# Patient Record
Sex: Male | Born: 1960
Health system: Southern US, Community
[De-identification: ages and names within clinical notes are randomized; demographics above are authoritative.]

## PROBLEM LIST (undated history)

## (undated) DIAGNOSIS — I2510 Atherosclerotic heart disease of native coronary artery without angina pectoris: Secondary | ICD-10-CM

## (undated) DIAGNOSIS — M5136 Other intervertebral disc degeneration, lumbar region: Secondary | ICD-10-CM

## (undated) DIAGNOSIS — D494 Neoplasm of unspecified behavior of bladder: Secondary | ICD-10-CM

## (undated) DIAGNOSIS — F32A Depression, unspecified: Secondary | ICD-10-CM

## (undated) DIAGNOSIS — F329 Major depressive disorder, single episode, unspecified: Secondary | ICD-10-CM

## (undated) DIAGNOSIS — I440 Atrioventricular block, first degree: Secondary | ICD-10-CM

## (undated) DIAGNOSIS — E669 Obesity, unspecified: Secondary | ICD-10-CM

## (undated) DIAGNOSIS — I200 Unstable angina: Secondary | ICD-10-CM

## (undated) DIAGNOSIS — F4310 Post-traumatic stress disorder, unspecified: Secondary | ICD-10-CM

## (undated) DIAGNOSIS — I10 Essential (primary) hypertension: Secondary | ICD-10-CM

## (undated) DIAGNOSIS — C679 Malignant neoplasm of bladder, unspecified: Secondary | ICD-10-CM

## (undated) DIAGNOSIS — M51369 Other intervertebral disc degeneration, lumbar region without mention of lumbar back pain or lower extremity pain: Secondary | ICD-10-CM

## (undated) DIAGNOSIS — R002 Palpitations: Secondary | ICD-10-CM

## (undated) DIAGNOSIS — Z8719 Personal history of other diseases of the digestive system: Secondary | ICD-10-CM

## (undated) DIAGNOSIS — R51 Headache: Secondary | ICD-10-CM

## (undated) DIAGNOSIS — F419 Anxiety disorder, unspecified: Secondary | ICD-10-CM

## (undated) DIAGNOSIS — E785 Hyperlipidemia, unspecified: Secondary | ICD-10-CM

## (undated) DIAGNOSIS — E119 Type 2 diabetes mellitus without complications: Secondary | ICD-10-CM

## (undated) DIAGNOSIS — K760 Fatty (change of) liver, not elsewhere classified: Secondary | ICD-10-CM

## (undated) DIAGNOSIS — Z87442 Personal history of urinary calculi: Secondary | ICD-10-CM

## (undated) DIAGNOSIS — K859 Acute pancreatitis without necrosis or infection, unspecified: Secondary | ICD-10-CM

## (undated) DIAGNOSIS — Q453 Other congenital malformations of pancreas and pancreatic duct: Secondary | ICD-10-CM

## (undated) DIAGNOSIS — Z87898 Personal history of other specified conditions: Secondary | ICD-10-CM

## (undated) HISTORY — DX: Acute pancreatitis without necrosis or infection, unspecified: K85.90

## (undated) HISTORY — PX: TONSILLECTOMY: SUR1361

## (undated) HISTORY — DX: Other intervertebral disc degeneration, lumbar region without mention of lumbar back pain or lower extremity pain: M51.369

## (undated) HISTORY — PX: CARDIAC CATHETERIZATION: SHX172

## (undated) HISTORY — DX: Depression, unspecified: F32.A

## (undated) HISTORY — DX: Major depressive disorder, single episode, unspecified: F32.9

## (undated) HISTORY — DX: Other congenital malformations of pancreas and pancreatic duct: Q45.3

## (undated) HISTORY — DX: Fatty (change of) liver, not elsewhere classified: K76.0

## (undated) HISTORY — DX: Other intervertebral disc degeneration, lumbar region: M51.36

---

## 2001-09-23 ENCOUNTER — Encounter: Payer: Self-pay | Admitting: *Deleted

## 2001-09-23 ENCOUNTER — Emergency Department (HOSPITAL_COMMUNITY): Admission: EM | Admit: 2001-09-23 | Discharge: 2001-09-23 | Payer: Self-pay | Admitting: *Deleted

## 2002-10-08 ENCOUNTER — Ambulatory Visit (HOSPITAL_COMMUNITY): Admission: RE | Admit: 2002-10-08 | Discharge: 2002-10-08 | Payer: Self-pay | Admitting: *Deleted

## 2006-03-08 ENCOUNTER — Ambulatory Visit: Payer: Self-pay | Admitting: Internal Medicine

## 2006-03-11 ENCOUNTER — Encounter: Payer: Self-pay | Admitting: Cardiology

## 2006-03-11 ENCOUNTER — Ambulatory Visit: Payer: Self-pay

## 2006-03-13 ENCOUNTER — Ambulatory Visit (HOSPITAL_COMMUNITY): Admission: RE | Admit: 2006-03-13 | Discharge: 2006-03-13 | Payer: Self-pay | Admitting: Internal Medicine

## 2006-03-15 ENCOUNTER — Inpatient Hospital Stay (HOSPITAL_BASED_OUTPATIENT_CLINIC_OR_DEPARTMENT_OTHER): Admission: RE | Admit: 2006-03-15 | Discharge: 2006-03-15 | Payer: Self-pay | Admitting: *Deleted

## 2006-03-15 ENCOUNTER — Ambulatory Visit: Payer: Self-pay | Admitting: Internal Medicine

## 2006-03-17 ENCOUNTER — Emergency Department (HOSPITAL_COMMUNITY): Admission: EM | Admit: 2006-03-17 | Discharge: 2006-03-17 | Payer: Self-pay | Admitting: Emergency Medicine

## 2006-03-17 ENCOUNTER — Ambulatory Visit: Payer: Self-pay | Admitting: Cardiology

## 2006-04-09 ENCOUNTER — Ambulatory Visit: Payer: Self-pay | Admitting: Internal Medicine

## 2008-06-14 ENCOUNTER — Inpatient Hospital Stay (HOSPITAL_COMMUNITY): Admission: EM | Admit: 2008-06-14 | Discharge: 2008-06-15 | Payer: Self-pay | Admitting: Emergency Medicine

## 2008-06-14 ENCOUNTER — Encounter (INDEPENDENT_AMBULATORY_CARE_PROVIDER_SITE_OTHER): Payer: Self-pay | Admitting: Emergency Medicine

## 2008-06-14 ENCOUNTER — Ambulatory Visit: Payer: Self-pay | Admitting: *Deleted

## 2008-11-26 HISTORY — PX: POSTERIOR LUMBAR FUSION: SHX6036

## 2008-12-28 ENCOUNTER — Encounter: Admission: RE | Admit: 2008-12-28 | Discharge: 2008-12-28 | Payer: Self-pay | Admitting: Orthopaedic Surgery

## 2011-04-10 NOTE — Discharge Summary (Signed)
[name], [name]                ACCOUNT NO.:  [id number]   MEDICAL RECORD NO.:  [id number]          PATIENT TYPE:  INP   LOCATION:  5501                         FACILITY:  [facility]   PHYSICIAN:  Ladell Pier, M.D.   DATE OF BIRTH:  January 19, 1961   DATE OF ADMISSION:  06/13/2008  DATE OF DISCHARGE:                               DISCHARGE SUMMARY   DISCHARGE DIAGNOSES:  1. Headache.  2. Syncopal episode.  3. Coronary artery disease, two previous cardiac catheterizations in      October 08, 2002 and March 15, 2006.  4. Dyslipidemia.  5. Tobacco use.  6. 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  7. History of arrhythmia evaluated in 2007.  8. History of anxiety.  9. Depression.   DISCHARGE MEDICATIONS:  1. Lipitor 40 mg daily.  2. Toprol-XL 25 mg daily, decreased from twice daily secondary to      bradycardia.  3. Aspirin 81 mg daily.  4. Zetia 10 mg daily.  5. Trilipix 135 mg daily.  6. Lexapro 10 mg daily.  7. Xanax 0.25 mg daily.  8. Vicodin q.6 h. as needed.   FOLLOWUP APPOINTMENTS:  The patient to follow up with Dr. Kathi Der  office.  He sees [employer], the [place].  The patient to follow up in 1  week.   PROCEDURES:  MRI/MRA shows no acute infarct, no intracranial hemorrhage  noted, no mass lesions, polypoid opacification in the maxillary sinus.  MR angiogram showed no aneurysm.  Chest x-ray:  No active lung disease.  CT of the head:  No acute intracranial abnormality.  Carotid Dopplers:  No blockage.   CONSULTATIONS:  None.   HISTORY OF PRESENT ILLNESS:  The patient is a 50 year old white male  presented to the emergency room with severe headache.  [place] he [place] out of his car.  He was  only out for a few minutes.  His wife was there when the episode  happened and he was brought to the emergency department by ambulance for  further evaluation.  Please see admission note for remainder of history.  Past medical  history, family history, social history, meds, allergies,  review of systems per admission H&P.   PHYSICAL EXAMINATION ON DISCHARGE:  VITAL SIGNS:  Temperature 98.1,  pulse of 64, respirations 15, blood pressure 123/75, pulse ox 97% on  room air.  HEENT:  Head is normocephalic, atraumatic.  Pupils reactive to light  without erythema.  CARDIOVASCULAR:  Slightly bradycardic.  LUNGS:  Clear bilaterally.  No wheezes, rhonchi, or rales.  ABDOMEN:  Positive bowel sounds.  EXTREMITIES:  Without edema.   HOSPITAL COURSE:  1. Syncopal episode/headache:  The patient was admitted to the      hospital for further treatment.  He had MRI/MRA and head CT done      that were fine.  Carotid Doppler was normal.  Sed rate was normal.      His headache resolved.  He was discharged home to follow up with      his primary care  physician.  If the headache returned, he may need      to see a neurologist but now the headache is totally gone.  He was      noted to have decreased pulse of 55, was low prior to getting his      beta blocker.  The beta blocker was held and his pulse came up to      64.  His Toprol was decreased to once a day secondary to      bradycardia.  2. Dyslipidemia:  He was continued on his cholesterol medications.   DISCHARGE LABORATORY DATA:  Sed rate of 2, D-dimer of 0.31, TSH 1.284,  and BNP 81.  Sodium 140, potassium 3.5, chloride 109, CO2 24, glucose  114, BUN 9, and creatinine 0.80.  Troponin less than 0.01.  WBC 8.8,  hemoglobin 14.6, and platelet 221.      Ladell Pier, M.D.  Electronically Signed     NJ/MEDQ  D:  06/15/2008  T:  06/16/2008  Job:  [id number]   cc:   Lindaann Pascal, PA-C  Ernestina Penna, M.D.

## 2011-04-10 NOTE — H&P (Signed)
NAMESYLVIO, [name]                ACCOUNT NO.:  [id number]   MEDICAL RECORD NO.:  [id number]          PATIENT TYPE:  INP   LOCATION:  5501                         FACILITY:  [facility]   PHYSICIAN:  Della Goo, M.D. DATE OF BIRTH:  September 12, 1961   DATE OF ADMISSION:  06/13/2008  DATE OF DISCHARGE:                              HISTORY & PHYSICAL   PRIMARY CARE PHYSICIAN:  Dr. Forestine Chute.   CHIEF COMPLAINT:  Headache and passed out.   HISTORY OF PRESENT ILLNESS:  A 50 year old male presenting to the  emergency department with having a severe headache all day.  He reports  [place] out [place] the [place] when he was getting out of his car.  He was  only passed out for less than a minute.  His wife was there when the  episode happened, and he was brought to the emergency department by  ambulance for further evaluation.  The patient reports having dizziness  which occurred prior to passing out.  He reports also having blurry  vision.  He denies having any chest pain or shortness of breath during  the event.  He denies having any nausea and vomiting.  He reports that  his dizziness did resolve.  The patient reports still having a headache  which is in the top of his head and in the back of his head, and it is a  dull headache.  The patient denies having any neck stiffness or fevers,  chills.   PAST MEDICAL HISTORY:  Significant for:  1. Coronary artery disease which is not obstructive, 2 previous      cardiac catheterizations October 08, 2002 and March 15, 2006.  2. Also history of hyperlipidemia.  3. Tobacco abuse.  4. The patient also has a history of a reported bulging disk and      degenerative disk disease of the lumbar spine.  5. The patient also has a history of arrhythmia which was evaluated in      2007.  6. Also history of anxiety/depression.   MEDICATIONS:  Include:  1. Lipitor 40 mg 1 p.o. daily.  2. Toprol XL 25 mg 1 p.o. b.i.d.  3. Aspirin 81 mg 1 p.o. daily.  4. Zetia 10  mg 1 p.o. daily.  5. Trilipix 135 mg 1 p.o. daily.  6. Lexapro 10 mg 1 p.o. daily.  7. Xanax 0.25 mg once daily.  8. Vicodin 5/500 one p.o. q.6h. p.r.n. pain.   ALLERGIES:  No known drug allergies.   SOCIAL HISTORY:  The patient is a smoker.  He smokes heavily 2 packs a  day for 25 years.  He is a nondrinker.  No history of illicit drug  usage.   FAMILY HISTORY:  Father with coronary artery disease.   REVIEW OF SYSTEMS:  Pertinents mentioned above.   PHYSICAL EXAMINATION FINDINGS:  This is a 50 year old, well-nourished,  well-developed male in no discomfort or acute distress currently.  VITAL SIGNS:  Temperature 98.9, blood pressure 138/81, heart rate 67,  respirations 16, O2 saturations 94-98%.  HEENT EXAMINATION:  Normocephalic, atraumatic.  Pupils equally round,  reactive to  light.  Extraocular movements are intact.  Funduscopic  benign.  There is no scleral icterus.  Oropharynx is clear.  His  tympanic membranes were clear.  No evidence of otitis media or otitis  externa, and his ear drums had a clear lightless reflex.  NECK:  Is supple.  Full range of motion.  No thyromegaly, adenopathy or  jugular venous distention.  CARDIOVASCULAR:  Regular rate and rhythm.  No murmurs, gallops or rubs.  LUNGS:  Clear to auscultation bilaterally.  ABDOMEN:  Positive bowel sounds, soft, nontender, nondistended.  EXTREMITIES:  Without cyanosis, clubbing or edema.  NEUROLOGIC EXAMINATION:  Nonfocal.   LABORATORY STUDIES:  White blood cell count 7.9, hemoglobin 14.7,  hematocrit 42.3, platelets 215, neutrophils 50%, lymphocytes 40%.  Sodium 140, potassium 3.8, chloride 111, CO2 22, BUN 12, creatinine 1.1  and glucose 89.  Pro time 12.3, INR 0.9 and PTT 25.  Cardiac enzymes  with a myoglobin of 74.3, CK-MB 1.4, troponin less than 0.05.   ASSESSMENT:  A 50 year old male being admitted with:  1. Syncope.  2. Severe headaches.  3. Dizziness.  4. Coronary artery disease.  5.  Hyperlipidemia.  6. Tobacco abuse.   PLAN:  The patient will be admitted to a telemetry area.  Cardiac  enzymes will be performed.  The patient will be placed on Nitro paste,  oxygen and aspirin therapy.  DVT and GI prophylaxis have also been  ordered.  Nicotine patch has also been ordered.  The patient then have a  chest x-ray performed which revealed no acute disease process, and a CT  scan of the head was performed which was negative.  Also an MRI and MRA  scan was also performed which was negative.  A carotid ultrasound study  will be ordered for completeness secondary to the patient's symptoms of  dizziness.      Della Goo, M.D.  Electronically Signed     HJ/MEDQ  D:  06/14/2008  T:  06/14/2008  Job:  [id number]   cc:   Forestine Chute, M.D.

## 2011-04-13 NOTE — Cardiovascular Report (Signed)
NAME:  [name], [name]                          ACCOUNT NO.:  [id number]   MEDICAL RECORD NO.:  [id number]                   PATIENT TYPE:  OIB   LOCATION:  2859                                 FACILITY:  [facility]   PHYSICIAN:  Veneda Melter, M.D. [facility]               DATE OF BIRTH:  November 11, 1961   DATE OF PROCEDURE:  10/08/2002  DATE OF DISCHARGE:                              CARDIAC CATHETERIZATION   PROCEDURES PERFORMED:  1. Left heart catheterization.  2. Left ventriculogram.  3. Selective coronary angiography.  4. Perclose of right femoral artery.   DIAGNOSES:  1. Mild coronary artery disease.  2. Normal left ventricular systolic function.   HISTORY:  The patient is a 50 year old gentleman with a history of  dyslipidemia, tobacco use, and a strong family history of coronary artery  disease, who presents with substernal chest discomfort.  The patient is  referred for further assessment.   TECHNIQUE:  Informed consent was obtained.  The patient was brought to the  catheterization lab.  A 6 French sheath was placed in the right femoral  artery using  modified Seldinger technique.  Then 6 [place] and JR4  catheters were used to engage the left and right coronary arteries and  selective angiogram was performed in various projections using manual  injection of contrast.  A 6 French pigtail catheter was then advanced to the  left ventricle and a left ventriculogram was performed using contrast.  After the catheters and sheaths were removed, a Perclose closure device was  used to plug the right femoral artery until adequate hemostasis was  achieved.  The patient tolerated the procedure well and was transferred to  the floor in stable condition.   FINDINGS:  1. The left ventricle was a large caliber vessel.  Angiography normal.  2. The LAD was a large caliber vessel which provides two small diagonal     branches.  The LAD had mild disease of 30% in the mid section.  3. The circumflex  artery was a large caliber vessel which provides a large     bifurcating marginal branch in the mid section.  The AV circumflex has     mild disease of 30%.  4. The right coronary artery is dominant and a large caliber vessel which     provides the posterior descending artery and posterior ventricular     branch.  The right coronary has moderate eccentric plaque of 40% in the     midsection.  The remaining vessel has mild disease.  5. Left ventriculogram:  Normal end-systolic and end-diastolic dimensions.     The overall left ventricular function is well preserved.  The ejection     fraction was 35%.  No bifurcation.  The LV pressure was 100/5, aortic     100/65, LVEDP 15.    ASSESSMENT AND PLAN:  The patient is a 50 year old gentleman with substernal  chest  discomfort.  He has mild coronary artery disease and well-preserved LV  function.  Continue medical therapy.  Risk factor modification will be  pursued.                                               Veneda Melter, M.D. [facility]    NG/MEDQ  D:  10/08/2002  T:  10/09/2002  Job:  [id number]   cc:   Lonzo Cloud. Kriste Basque, M.D. [facility]

## 2011-04-13 NOTE — Cardiovascular Report (Signed)
NAMEHENLEY, BOETTNER NO.:  [id number]   MEDICAL RECORD NO.:  [id number]          PATIENT TYPE:  OIB   LOCATION:  1962                         FACILITY:  [facility]   PHYSICIAN:  Arvilla Meres, M.D. LHCDATE OF BIRTH:  July 05, 1961   DATE OF PROCEDURE:  03/15/2006  DATE OF DISCHARGE:  03/15/2006                              CARDIAC CATHETERIZATION   DATE OF PROCEDURE:  March 15, 2006.   PRIMARY CARE PHYSICIAN:  His primary care physician is Dr. Vernon Prey.  His  PA to Mr. Montey Hora, [place]. Cardiologist is Dr. Gala Romney.   PATIENT IDENTIFICATION:  Mr. Vanleer is a 50 year old male with multiple  cardiac risk factors including ongoing tobacco use. He has had a cardiac  catheterization in 2003 which showed mild nonobstructive coronary artery  disease; however, he has been having significant amount of chest pain over  the past month. This is fairly atypical. However, I did have a long  discussion with him and his wife regarding the possible etiologies. We  discussed proceeding with stress testing versus catheterization and they  felt strongly about pursuing cardiac catheterization. Thus he was brought to  the outpatient catheterization lab. Diagnostic catheterization was  performed.   PROCEDURES PERFORMED:  1.  Selective coronary angiography.  2.  Left heart cath.  3.  Left ventriculogram.  4.  Abdominal aortogram.   DESCRIPTION OF THE PROCEDURE:  A 4-French arterial sheath was placed in the  right femoral artery using a modified Seldinger technique. Standard  catheters including a preformed Judkins JL-4, 3D-RC and angled pigtail were  used for procedure. All catheterizations were made over wire. There were no  apparent complications. Central aortic pressure is 101/65 with a mean of 82.  LV pressure is 110/90 with an EDP of 19. There is no aortic stenosis.   Left main was normal. LAD was long vessel coursing the apex. It gave off two  moderate-size diagonals. There  is no angiographic CAD. Left circumflex was a  moderate-size vessel made up primarily of a large branching OM-1, there was  no angiographic CAD.   Right coronary artery was a large dominant vessel that gave off a large PDA  and three small PLs. There was a 20% distal stenosis in the RCA around the  bend.   Left ventriculogram showed an EF of 50% with no wall motion abnormalities.  There was no mitral regurgitation.   Abdominal aortogram showed patent renal arteries bilaterally with no  significant abdominal aortic iliac disease.   ASSESSMENT:  1.  Minimal nonobstructive coronary artery disease.  2.  Low normal left ventricular function in the setting of bradycardia with      heart rates in the 50s.   PLAN/DISCUSSION:  At this point I doubt his chest pain is cardiac. We will  proceed with aggressive risk factor modification including smoking  cessation. I did suggest that possibly trying a proton pump inhibitor may  provide him some relief.      Arvilla Meres, M.D. [facility]  Electronically Signed     DB/MEDQ  D:  03/15/2006  T:  03/15/2006  Job:  [id number]   cc:   Ernestina Penna, M.D.  Fax: [phone]   Montey Hora, PA

## 2011-08-24 LAB — POCT I-STAT, CHEM 8
BUN: 12
Calcium, Ion: 1.17
Chloride: 107
Creatinine, Ser: 1.1
Glucose, Bld: 89
TCO2: 24

## 2011-08-24 LAB — DIFFERENTIAL
Basophils Absolute: 0
Basophils Relative: 1
Eosinophils Relative: 3
Lymphocytes Relative: 24
Lymphocytes Relative: 40
Lymphs Abs: 2.1
Monocytes Relative: 7
Neutro Abs: 4
Neutrophils Relative %: 50
Neutrophils Relative %: 67

## 2011-08-24 LAB — PROTIME-INR
INR: 0.9
Prothrombin Time: 12.3

## 2011-08-24 LAB — TSH: TSH: 1.284

## 2011-08-24 LAB — BASIC METABOLIC PANEL
BUN: 9
Chloride: 109
GFR calc Af Amer: >60
GFR calc non Af Amer: >60
Potassium: 3.5

## 2011-08-24 LAB — CBC
HCT: 41.5
Hemoglobin: 14.7
MCHC: 34.8
Platelets: 221
RBC: 4.65
RDW: 12.8
WBC: 8.8

## 2011-08-24 LAB — POCT CARDIAC MARKERS
Operator id: 277751
Troponin i, poc: <0.05

## 2011-08-24 LAB — TROPONIN I: Troponin I: <0.01

## 2011-08-24 LAB — APTT: aPTT: 25

## 2011-08-24 LAB — CK TOTAL AND CKMB (NOT AT ARMC): Total CK: 366 — ABNORMAL HIGH

## 2011-08-24 LAB — D-DIMER, QUANTITATIVE: D-Dimer, Quant: 0.31

## 2011-08-24 LAB — SEDIMENTATION RATE: Sed Rate: 2

## 2012-03-31 ENCOUNTER — Ambulatory Visit (HOSPITAL_COMMUNITY)
Admission: RE | Admit: 2012-03-31 | Discharge: 2012-03-31 | Disposition: A | Discharge status: Home / Self Care | Payer: 59 | Source: Ambulatory Visit | Attending: Family Medicine | Admitting: Family Medicine

## 2012-03-31 ENCOUNTER — Other Ambulatory Visit: Payer: Self-pay | Admitting: Family Medicine

## 2012-03-31 ENCOUNTER — Encounter (HOSPITAL_COMMUNITY): Payer: Self-pay

## 2012-03-31 DIAGNOSIS — R319 Hematuria, unspecified: Secondary | ICD-10-CM | POA: Insufficient documentation

## 2012-03-31 DIAGNOSIS — K7689 Other specified diseases of liver: Secondary | ICD-10-CM | POA: Insufficient documentation

## 2012-03-31 DIAGNOSIS — R109 Unspecified abdominal pain: Secondary | ICD-10-CM

## 2012-03-31 DIAGNOSIS — R310 Gross hematuria: Secondary | ICD-10-CM

## 2012-03-31 DIAGNOSIS — R1032 Left lower quadrant pain: Secondary | ICD-10-CM | POA: Insufficient documentation

## 2012-03-31 DIAGNOSIS — N201 Calculus of ureter: Secondary | ICD-10-CM | POA: Insufficient documentation

## 2012-06-19 ENCOUNTER — Other Ambulatory Visit (HOSPITAL_COMMUNITY): Payer: Self-pay | Admitting: Orthopaedic Surgery

## 2012-06-19 DIAGNOSIS — M545 Low back pain, unspecified: Secondary | ICD-10-CM

## 2012-06-20 ENCOUNTER — Ambulatory Visit (HOSPITAL_COMMUNITY)
Admission: RE | Admit: 2012-06-20 | Discharge: 2012-06-20 | Disposition: A | Discharge status: Home / Self Care | Payer: 59 | Source: Ambulatory Visit | Attending: Orthopaedic Surgery | Admitting: Orthopaedic Surgery

## 2012-06-20 DIAGNOSIS — M545 Low back pain, unspecified: Secondary | ICD-10-CM | POA: Insufficient documentation

## 2012-06-20 DIAGNOSIS — M79609 Pain in unspecified limb: Secondary | ICD-10-CM | POA: Insufficient documentation

## 2012-06-20 DIAGNOSIS — M5146 Schmorl's nodes, lumbar region: Secondary | ICD-10-CM | POA: Insufficient documentation

## 2012-06-20 DIAGNOSIS — M48061 Spinal stenosis, lumbar region without neurogenic claudication: Secondary | ICD-10-CM | POA: Insufficient documentation

## 2013-05-12 ENCOUNTER — Other Ambulatory Visit: Payer: Self-pay | Admitting: *Deleted

## 2013-05-12 MED ORDER — ATORVASTATIN CALCIUM 80 MG PO TABS: 80.0000 mg | ORAL_TABLET | Freq: Every day | ORAL | Status: DC

## 2013-05-12 MED ORDER — ESCITALOPRAM OXALATE 10 MG PO TABS: 10.0000 mg | ORAL_TABLET | Freq: Every day | ORAL | Status: DC

## 2013-05-12 NOTE — Telephone Encounter (Signed)
Last seen 05/13, last labs 11/13

## 2013-08-19 ENCOUNTER — Other Ambulatory Visit: Payer: Self-pay | Admitting: Nurse Practitioner

## 2013-09-30 ENCOUNTER — Ambulatory Visit (INDEPENDENT_AMBULATORY_CARE_PROVIDER_SITE_OTHER): Payer: PRIVATE HEALTH INSURANCE

## 2013-09-30 ENCOUNTER — Encounter: Payer: Self-pay | Admitting: Nurse Practitioner

## 2013-09-30 ENCOUNTER — Encounter (INDEPENDENT_AMBULATORY_CARE_PROVIDER_SITE_OTHER): Payer: Self-pay

## 2013-09-30 ENCOUNTER — Ambulatory Visit (INDEPENDENT_AMBULATORY_CARE_PROVIDER_SITE_OTHER): Payer: PRIVATE HEALTH INSURANCE | Admitting: Nurse Practitioner

## 2013-09-30 VITALS — BP 107/68 | HR 60 | Temp 98.0°F | Ht 71.0 in | Wt 246.0 lb

## 2013-09-30 DIAGNOSIS — F411 Generalized anxiety disorder: Secondary | ICD-10-CM | POA: Insufficient documentation

## 2013-09-30 DIAGNOSIS — I4892 Unspecified atrial flutter: Secondary | ICD-10-CM

## 2013-09-30 DIAGNOSIS — Z87891 Personal history of nicotine dependence: Secondary | ICD-10-CM

## 2013-09-30 DIAGNOSIS — E785 Hyperlipidemia, unspecified: Secondary | ICD-10-CM | POA: Insufficient documentation

## 2013-09-30 DIAGNOSIS — Z1211 Encounter for screening for malignant neoplasm of colon: Secondary | ICD-10-CM

## 2013-09-30 DIAGNOSIS — Z125 Encounter for screening for malignant neoplasm of prostate: Secondary | ICD-10-CM

## 2013-09-30 HISTORY — DX: Unspecified atrial flutter: I48.92

## 2013-09-30 MED ORDER — ESCITALOPRAM OXALATE 10 MG PO TABS: 10.0000 mg | ORAL_TABLET | Freq: Every day | ORAL | Status: DC

## 2013-09-30 MED ORDER — NEBIVOLOL HCL 5 MG PO TABS: 5.0000 mg | ORAL_TABLET | Freq: Every day | ORAL | Status: DC

## 2013-09-30 MED ORDER — CHOLINE FENOFIBRATE 135 MG PO CPDR: 135.0000 mg | DELAYED_RELEASE_CAPSULE | Freq: Every day | ORAL | Status: DC

## 2013-09-30 MED ORDER — EZETIMIBE 10 MG PO TABS: 10.0000 mg | ORAL_TABLET | Freq: Every day | ORAL | Status: DC

## 2013-09-30 MED ORDER — ALPRAZOLAM 0.25 MG PO TABS: 0.2500 mg | ORAL_TABLET | Freq: Every evening | ORAL | Status: DC | PRN

## 2013-09-30 MED ORDER — ATORVASTATIN CALCIUM 80 MG PO TABS: 80.0000 mg | ORAL_TABLET | Freq: Every day | ORAL | Status: DC

## 2013-09-30 NOTE — Progress Notes (Signed)
Subjective:    Patient ID: [name], male    DOB: [birth year], 52 y.o.   MRN: [id number] Here for CPE Hyperlipidemia This is a chronic problem. The current episode started more than 1 year ago. The problem is controlled. Recent lipid tests were reviewed and are normal. He has no history of diabetes or hypothyroidism. Pertinent negatives include no chest pain, leg pain or myalgias. Current antihyperlipidemic treatment includes statins and fibric acid derivatives. The current treatment provides moderate improvement of lipids. There are no compliance problems.  Risk factors for coronary artery disease include dyslipidemia, family history and male sex.  Anxiety Presents for follow-up visit. The problem has been waxing and waning. Symptoms include insomnia, nervous/anxious behavior and panic. Patient reports no chest pain. Symptoms occur occasionally. The severity of symptoms is moderate. The quality of sleep is fair. Nighttime awakenings: one to two.   His past medical history is significant for anxiety/panic attacks and depression. Past treatments include benzodiazephines and SSRIs. The treatment provided mild relief. Compliance with prior treatments has been good.  Atrial Flutter Pt takes Bystolic daily-No complaints or palpations   Review of Systems  Constitutional: Negative.   Respiratory: Negative.   Cardiovascular: Negative.  Negative for chest pain.  Genitourinary: Negative.   Musculoskeletal: Negative for myalgias.  Psychiatric/Behavioral: The patient is nervous/anxious and has insomnia.   All other systems reviewed and are negative.       Objective:   Physical Exam  Vitals reviewed. Constitutional: He is oriented to person, place, and time. He appears well-developed and well-nourished.  HENT:  Head: Normocephalic.  Right Ear: External ear normal.  Left Ear: External ear normal.  Nose: Nose normal.  Mouth/Throat: Oropharynx is clear and moist.  Eyes: Pupils are equal,  round, and reactive to light.  Neck: Normal range of motion. Neck supple.  Cardiovascular: Normal rate, regular rhythm, normal heart sounds and intact distal pulses.   Pulmonary/Chest: Effort normal and breath sounds normal. No respiratory distress. He has no wheezes.  Abdominal: Soft. Bowel sounds are normal. He exhibits no distension. There is no tenderness.  Musculoskeletal: Normal range of motion. He exhibits no edema and no tenderness.  Neurological: He is alert and oriented to person, place, and time.  Skin: Skin is warm and dry.  Psychiatric: He has a normal mood and affect. His behavior is normal. Judgment and thought content normal.   BP 107/68  Pulse 60  Temp(Src) 98 F (36.7 C) (Oral)  Ht 5\' 11"  (1.803 m)  Wt 246 lb (111.585 kg)  BMI 34.33 kg/m2  Chest x-ray- chronic bronchitic changes-Preliminary reading by Paulene Floor, FNP  [facility]  ekg- borderline first degree heart block-Mary-Margaret Daphine Deutscher, FNP        Assessment & Plan:   1. GAD (generalized anxiety disorder)   2. Hyperlipidemia LDL goal < 100   3. Atrial flutter   4. Former smoker   5. Colon cancer screening    Orders Placed This Encounter  Procedures  . DG Chest 2 View    Standing Status: Future     Number of Occurrences: 1     Standing Expiration Date: 11/30/2014    Order Specific Question:  Reason for Exam (SYMPTOM  OR DIAGNOSIS REQUIRED)    Answer:  screening    Order Specific Question:  Preferred imaging location?    Answer:  Internal  . Ambulatory referral to Gastroenterology    Referral Priority:  Routine    Referral Type:  Consultation    Referral Reason:  Specialty Services Required    Referred to Provider:  Beverley Fiedler, MD    Requested Specialty:  Gastroenterology    Number of Visits Requested:  1  . EKG 12-Lead   Meds ordered this encounter  Medications  . DISCONTD: nebivolol (BYSTOLIC) 5 MG tablet    Sig: Take 5 mg by mouth daily.  Marland Kitchen DISCONTD: Choline Fenofibrate 135 MG capsule     Sig: Take 135 mg by mouth daily.  Marland Kitchen DISCONTD: ezetimibe (ZETIA) 10 MG tablet    Sig: Take 10 mg by mouth daily.  . cholecalciferol (VITAMIN D) 1000 UNITS tablet    Sig: Take 1,000 Units by mouth daily.  Marland Kitchen aspirin 325 MG tablet    Sig: Take 325 mg by mouth daily.  . fish oil-omega-3 fatty acids 1000 MG capsule    Sig: Take 2 g by mouth daily.  Marland Kitchen DISCONTD: ALPRAZolam (XANAX) 0.25 MG tablet    Sig: Take 0.25 mg by mouth at bedtime as needed for anxiety.  Marland Kitchen escitalopram (LEXAPRO) 10 MG tablet    Sig: Take 1 tablet (10 mg total) by mouth daily.    Dispense:  90 tablet    Refill:  1    Needs to be seen before next refill    Order Specific Question:  Supervising Provider    Answer:  Ernestina Penna [[id number]]  . atorvastatin (LIPITOR) 80 MG tablet    Sig: Take 1 tablet (80 mg total) by mouth daily at 6 PM.    Dispense:  90 tablet    Refill:  1    Needs to be seen before next refill    Order Specific Question:  Supervising Provider    Answer:  Ernestina Penna [[id number]]  . nebivolol (BYSTOLIC) 5 MG tablet    Sig: Take 1 tablet (5 mg total) by mouth daily.    Dispense:  90 tablet    Refill:  1    Order Specific Question:  Supervising Provider    Answer:  Ernestina Penna [[id number]]  . Choline Fenofibrate 135 MG capsule    Sig: Take 1 capsule (135 mg total) by mouth daily.    Dispense:  90 capsule    Refill:  1    Order Specific Question:  Supervising Provider    Answer:  Ernestina Penna [[id number]]  . ezetimibe (ZETIA) 10 MG tablet    Sig: Take 1 tablet (10 mg total) by mouth daily.    Dispense:  90 tablet    Refill:  1    Order Specific Question:  Supervising Provider    Answer:  Ernestina Penna [[id number]]  . ALPRAZolam (XANAX) 0.25 MG tablet    Sig: Take 1 tablet (0.25 mg total) by mouth at bedtime as needed for anxiety.    Dispense:  30 tablet    Refill:  1    Order Specific Question:  Supervising Provider    Answer:  Deborra Medina    Continue all meds Labs pending Diet and exercise  encouraged Health maintenance reviewed Follow up in 3 months  Mary-Margaret Daphine Deutscher, FNP

## 2013-09-30 NOTE — Patient Instructions (Signed)
Health Maintenance, Males A healthy lifestyle and preventative care can promote health and wellness.  Maintain regular health, dental, and eye exams.  Eat a healthy diet. Foods like vegetables, fruits, whole grains, low-fat dairy products, and lean protein foods contain the nutrients you need without too many calories. Decrease your intake of foods high in solid fats, added sugars, and salt. Get information about a proper diet from your caregiver, if necessary.  Regular physical exercise is one of the most important things you can do for your health. Most adults should get at least 150 minutes of moderate-intensity exercise (any activity that increases your heart rate and causes you to sweat) each week. In addition, most adults need muscle-strengthening exercises on 2 or more days a week.   Maintain a healthy weight. The body mass index (BMI) is a screening tool to identify possible weight problems. It provides an estimate of body fat based on height and weight. Your caregiver can help determine your BMI, and can help you achieve or maintain a healthy weight. For adults 20 years and older:  A BMI below 18.5 is considered underweight.  A BMI of 18.5 to 24.9 is normal.  A BMI of 25 to 29.9 is considered overweight.  A BMI of 30 and above is considered obese.  Maintain normal blood lipids and cholesterol by exercising and minimizing your intake of saturated fat. Eat a balanced diet with plenty of fruits and vegetables. Blood tests for lipids and cholesterol should begin at age 20 and be repeated every 5 years. If your lipid or cholesterol levels are high, you are over 50, or you are a high risk for heart disease, you may need your cholesterol levels checked more frequently.Ongoing high lipid and cholesterol levels should be treated with medicines, if diet and exercise are not effective.  If you smoke, find out from your caregiver how to quit. If you do not use tobacco, do not start.  Lung  cancer screening is recommended for adults aged 55 80 years who are at high risk for developing lung cancer because of a history of smoking. Yearly low-dose computed tomography (CT) is recommended for people who have at least a 30-pack-year history of smoking and are a current smoker or have quit within the past 15 years. A pack year of smoking is smoking an average of 1 pack of cigarettes a day for 1 year (for example: 1 pack a day for 30 years or 2 packs a day for 15 years). Yearly screening should continue until the smoker has stopped smoking for at least 15 years. Yearly screening should also be stopped for people who develop a health problem that would prevent them from having lung cancer treatment.  If you choose to drink alcohol, do not exceed 2 drinks per day. One drink is considered to be 12 ounces (355 mL) of beer, 5 ounces (148 mL) of wine, or 1.5 ounces (44 mL) of liquor.  Avoid use of street drugs. Do not share needles with anyone. Ask for help if you need support or instructions about stopping the use of drugs.  High blood pressure causes heart disease and increases the risk of stroke. Blood pressure should be checked at least every 1 to 2 years. Ongoing high blood pressure should be treated with medicines if weight loss and exercise are not effective.  If you are 45 to 51 years old, ask your caregiver if you should take aspirin to prevent heart disease.  Diabetes screening involves taking a blood   sample to check your fasting blood sugar level. This should be done once every 3 years, after age 45, if you are within normal weight and without risk factors for diabetes. Testing should be considered at a younger age or be carried out more frequently if you are overweight and have at least 1 risk factor for diabetes.  Colorectal cancer can be detected and often prevented. Most routine colorectal cancer screening begins at the age of 50 and continues through age 75. However, your caregiver may  recommend screening at an earlier age if you have risk factors for colon cancer. On a yearly basis, your caregiver may provide home test kits to check for hidden blood in the stool. Use of a small camera at the end of a tube, to directly examine the colon (sigmoidoscopy or colonoscopy), can detect the earliest forms of colorectal cancer. Talk to your caregiver about this at age 50, when routine screening begins. Direct examination of the colon should be repeated every 5 to 10 years through age 75, unless early forms of pre-cancerous polyps or small growths are found.  Hepatitis C blood testing is recommended for all people born from 1945 through 1965 and any individual with known risks for hepatitis C.  Healthy men should no longer receive prostate-specific antigen (PSA) blood tests as part of routine cancer screening. Consult with your caregiver about prostate cancer screening.  Testicular cancer screening is not recommended for adolescents or adult males who have no symptoms. Screening includes self-exam, caregiver exam, and other screening tests. Consult with your caregiver about any symptoms you have or any concerns you have about testicular cancer.  Practice safe sex. Use condoms and avoid high-risk sexual practices to reduce the spread of sexually transmitted infections (STIs).  Use sunscreen. Apply sunscreen liberally and repeatedly throughout the day. You should seek shade when your shadow is shorter than you. Protect yourself by wearing long sleeves, pants, a wide-brimmed hat, and sunglasses year round, whenever you are outdoors.  Notify your caregiver of new moles or changes in moles, especially if there is a change in shape or color. Also notify your caregiver if a mole is larger than the size of a pencil eraser.  A one-time screening for abdominal aortic aneurysm (AAA) and surgical repair of large AAAs by sound wave imaging (ultrasonography) is recommended for ages 65 to 75 years who are  current or former smokers.  Stay current with your immunizations. Document Released: 05/10/2008 Document Revised: 03/09/2013 Document Reviewed: 04/09/2011 [employer] Patient Information 2014 [employer].  

## 2013-10-02 LAB — CMP14+EGFR
ALT: 53 IU/L — ABNORMAL HIGH (ref 0–44)
Albumin: 4.7 g/dL (ref 3.5–5.5)
BUN: 18 mg/dL (ref 6–24)
CO2: 24 mmol/L (ref 18–29)
Chloride: 101 mmol/L (ref 97–108)
GFR calc Af Amer: 114 mL/min/{1.73_m2} (ref >59)
Glucose: 100 mg/dL — ABNORMAL HIGH (ref 65–99)
Potassium: 4.5 mmol/L (ref 3.5–5.2)
Sodium: 142 mmol/L (ref 134–144)
Total Bilirubin: 0.4 mg/dL (ref 0.0–1.2)
Total Protein: 6.6 g/dL (ref 6.0–8.5)

## 2013-10-02 LAB — PSA, TOTAL AND FREE
PSA, Free Pct: 45 %
PSA, Free: 0.27 ng/mL

## 2013-10-02 LAB — NMR, LIPOPROFILE
LDL Particle Number: 1949 nmol/L — ABNORMAL HIGH (ref <1000)
LDL Size: 20.3 nm — ABNORMAL LOW (ref >20.5)
LDLC SERPL CALC-MCNC: 98 mg/dL (ref <100)
LP-IR Score: 73 — ABNORMAL HIGH (ref <=45)
Triglycerides by NMR: 133 mg/dL (ref <150)

## 2013-10-20 ENCOUNTER — Encounter: Payer: Self-pay | Admitting: Internal Medicine

## 2013-12-08 ENCOUNTER — Encounter: Payer: 59 | Admitting: Internal Medicine

## 2013-12-10 ENCOUNTER — Encounter: Payer: 59 | Admitting: Internal Medicine

## 2013-12-25 ENCOUNTER — Other Ambulatory Visit: Payer: Self-pay | Admitting: Nurse Practitioner

## 2013-12-25 ENCOUNTER — Encounter: Payer: Self-pay | Admitting: Nurse Practitioner

## 2013-12-25 MED ORDER — ESCITALOPRAM OXALATE 20 MG PO TABS: 20.0000 mg | ORAL_TABLET | Freq: Every day | ORAL | Status: DC

## 2014-03-18 ENCOUNTER — Encounter: Payer: Self-pay | Admitting: Nurse Practitioner

## 2014-03-22 ENCOUNTER — Other Ambulatory Visit: Payer: Self-pay

## 2014-03-22 DIAGNOSIS — Z1211 Encounter for screening for malignant neoplasm of colon: Secondary | ICD-10-CM

## 2014-03-26 ENCOUNTER — Encounter: Payer: Self-pay | Admitting: Internal Medicine

## 2014-05-13 ENCOUNTER — Encounter: Payer: 59 | Admitting: Internal Medicine

## 2014-05-31 ENCOUNTER — Other Ambulatory Visit: Payer: Self-pay | Admitting: Nurse Practitioner

## 2014-07-16 ENCOUNTER — Other Ambulatory Visit: Payer: Self-pay | Admitting: Family Medicine

## 2014-07-19 NOTE — Telephone Encounter (Signed)
No refill on lipitor- NTBS

## 2014-07-19 NOTE — Telephone Encounter (Signed)
Last lipids 11/14. ntbs

## 2014-08-11 ENCOUNTER — Observation Stay (HOSPITAL_COMMUNITY)
Admission: EM | Admit: 2014-08-11 | Discharge: 2014-08-12 | Disposition: A | Discharge status: Home / Self Care | Payer: PRIVATE HEALTH INSURANCE | Source: Other Acute Inpatient Hospital | Attending: Interventional Cardiology | Admitting: Interventional Cardiology

## 2014-08-11 ENCOUNTER — Encounter (HOSPITAL_COMMUNITY): Payer: Self-pay | Admitting: Physician Assistant

## 2014-08-11 DIAGNOSIS — Z8249 Family history of ischemic heart disease and other diseases of the circulatory system: Secondary | ICD-10-CM | POA: Insufficient documentation

## 2014-08-11 DIAGNOSIS — I2510 Atherosclerotic heart disease of native coronary artery without angina pectoris: Secondary | ICD-10-CM | POA: Diagnosis not present

## 2014-08-11 DIAGNOSIS — R0789 Other chest pain: Secondary | ICD-10-CM | POA: Diagnosis not present

## 2014-08-11 DIAGNOSIS — F419 Anxiety disorder, unspecified: Secondary | ICD-10-CM | POA: Diagnosis present

## 2014-08-11 DIAGNOSIS — I200 Unstable angina: Secondary | ICD-10-CM | POA: Diagnosis not present

## 2014-08-11 DIAGNOSIS — R079 Chest pain, unspecified: Secondary | ICD-10-CM | POA: Diagnosis present

## 2014-08-11 DIAGNOSIS — E669 Obesity, unspecified: Secondary | ICD-10-CM | POA: Diagnosis present

## 2014-08-11 DIAGNOSIS — F411 Generalized anxiety disorder: Secondary | ICD-10-CM | POA: Diagnosis not present

## 2014-08-11 DIAGNOSIS — E119 Type 2 diabetes mellitus without complications: Secondary | ICD-10-CM | POA: Diagnosis not present

## 2014-08-11 DIAGNOSIS — E785 Hyperlipidemia, unspecified: Secondary | ICD-10-CM | POA: Diagnosis not present

## 2014-08-11 DIAGNOSIS — Z87891 Personal history of nicotine dependence: Secondary | ICD-10-CM | POA: Diagnosis not present

## 2014-08-11 HISTORY — DX: Anxiety disorder, unspecified: F41.9

## 2014-08-11 HISTORY — DX: Headache: R51

## 2014-08-11 HISTORY — DX: Obesity, unspecified: E66.9

## 2014-08-11 HISTORY — DX: Hyperlipidemia, unspecified: E78.5

## 2014-08-11 HISTORY — DX: Atherosclerotic heart disease of native coronary artery without angina pectoris: I25.10

## 2014-08-11 HISTORY — DX: Unstable angina: I20.0

## 2014-08-11 LAB — COMPREHENSIVE METABOLIC PANEL
ALBUMIN: 3.9 g/dL (ref 3.5–5.2)
ALK PHOS: 60 U/L (ref 39–117)
ALT: 57 U/L — ABNORMAL HIGH (ref 0–53)
AST: 34 U/L (ref 0–37)
Anion gap: 11 (ref 5–15)
BILIRUBIN TOTAL: 0.2 mg/dL — AB (ref 0.3–1.2)
BUN: 18 mg/dL (ref 6–23)
CHLORIDE: 99 meq/L (ref 96–112)
CO2: 27 mEq/L (ref 19–32)
Calcium: 9.6 mg/dL (ref 8.4–10.5)
Creatinine, Ser: 0.84 mg/dL (ref 0.50–1.35)
GFR calc Af Amer: >90 mL/min (ref >90)
GFR calc non Af Amer: >90 mL/min (ref >90)
Glucose, Bld: 124 mg/dL — ABNORMAL HIGH (ref 70–99)
Potassium: 4 mEq/L (ref 3.7–5.3)
SODIUM: 137 meq/L (ref 137–147)
TOTAL PROTEIN: 7.1 g/dL (ref 6.0–8.3)

## 2014-08-11 LAB — CBC WITH DIFFERENTIAL/PLATELET
BASOS PCT: 0 % (ref 0–1)
Basophils Absolute: 0 10*3/uL (ref 0.0–0.1)
EOS ABS: 0.1 10*3/uL (ref 0.0–0.7)
Eosinophils Relative: 1 % (ref 0–5)
HCT: 42.3 % (ref 39.0–52.0)
Hemoglobin: 14.2 g/dL (ref 13.0–17.0)
Lymphocytes Relative: 19 % (ref 12–46)
Lymphs Abs: 2.1 10*3/uL (ref 0.7–4.0)
MCH: 28.9 pg (ref 26.0–34.0)
MCHC: 33.6 g/dL (ref 30.0–36.0)
MCV: 86 fL (ref 78.0–100.0)
Monocytes Absolute: 0.7 10*3/uL (ref 0.1–1.0)
Monocytes Relative: 6 % (ref 3–12)
NEUTROS ABS: 7.9 10*3/uL — AB (ref 1.7–7.7)
Neutrophils Relative %: 74 % (ref 43–77)
PLATELETS: 236 10*3/uL (ref 150–400)
RBC: 4.92 MIL/uL (ref 4.22–5.81)
RDW: 12.6 % (ref 11.5–15.5)
WBC: 10.7 10*3/uL — AB (ref 4.0–10.5)

## 2014-08-11 LAB — PROTIME-INR
INR: 1.07 (ref 0.00–1.49)
Prothrombin Time: 13.9 seconds (ref 11.6–15.2)

## 2014-08-11 LAB — TROPONIN I: Troponin I: <0.3 ng/mL (ref <0.30)

## 2014-08-11 MED ORDER — SODIUM CHLORIDE 0.9 % IV SOLN
INTRAVENOUS | Status: DC
  Administered 2014-08-11: 18:00:00 via INTRAVENOUS

## 2014-08-11 MED ORDER — NITROGLYCERIN IN D5W 200-5 MCG/ML-% IV SOLN
10.0000 ug/min | INTRAVENOUS | Status: DC
  Administered 2014-08-11: 10 ug/min via INTRAVENOUS

## 2014-08-11 MED ORDER — ACETAMINOPHEN 325 MG PO TABS
650.0000 mg | ORAL_TABLET | ORAL | Status: DC | PRN
  Administered 2014-08-11 – 2014-08-12 (×3): 650 mg via ORAL
  Filled 2014-08-11 (×3): qty 2

## 2014-08-11 MED ORDER — ESCITALOPRAM OXALATE 10 MG PO TABS
10.0000 mg | ORAL_TABLET | Freq: Every day | ORAL | Status: DC
  Administered 2014-08-12: 10 mg via ORAL
  Filled 2014-08-11 (×2): qty 1

## 2014-08-11 MED ORDER — SODIUM CHLORIDE 0.9 % IJ SOLN
3.0000 mL | Freq: Two times a day (BID) | INTRAMUSCULAR | Status: DC
  Administered 2014-08-12: 3 mL via INTRAVENOUS

## 2014-08-11 MED ORDER — ALPRAZOLAM 0.25 MG PO TABS
0.2500 mg | ORAL_TABLET | Freq: Every evening | ORAL | Status: DC | PRN
  Administered 2014-08-11: 0.25 mg via ORAL
  Filled 2014-08-11: qty 1

## 2014-08-11 MED ORDER — HEPARIN SODIUM (PORCINE) 5000 UNIT/ML IJ SOLN
5000.0000 [IU] | Freq: Three times a day (TID) | INTRAMUSCULAR | Status: DC
  Administered 2014-08-11 – 2014-08-12 (×2): 5000 [IU] via SUBCUTANEOUS
  Filled 2014-08-11 (×4): qty 1

## 2014-08-11 MED ORDER — SODIUM CHLORIDE 0.9 % IV SOLN: 250.0000 mL | INTRAVENOUS | Status: DC | PRN

## 2014-08-11 MED ORDER — SODIUM CHLORIDE 0.9 % IJ SOLN: 3.0000 mL | INTRAMUSCULAR | Status: DC | PRN

## 2014-08-11 MED ORDER — ONDANSETRON HCL 4 MG/2ML IJ SOLN
4.0000 mg | Freq: Four times a day (QID) | INTRAMUSCULAR | Status: DC | PRN
  Administered 2014-08-11 – 2014-08-12 (×2): 4 mg via INTRAVENOUS
  Filled 2014-08-11 (×2): qty 2

## 2014-08-11 MED ORDER — ASPIRIN 81 MG PO CHEW: 324.0000 mg | CHEWABLE_TABLET | ORAL | Status: DC

## 2014-08-11 MED ORDER — NITROGLYCERIN 0.4 MG SL SUBL: 0.4000 mg | SUBLINGUAL_TABLET | SUBLINGUAL | Status: DC | PRN

## 2014-08-11 MED ORDER — METOPROLOL TARTRATE 25 MG PO TABS
25.0000 mg | ORAL_TABLET | Freq: Two times a day (BID) | ORAL | Status: DC
  Administered 2014-08-11 – 2014-08-12 (×2): 25 mg via ORAL
  Filled 2014-08-11 (×3): qty 1

## 2014-08-11 MED ORDER — NITROGLYCERIN IN D5W 200-5 MCG/ML-% IV SOLN
INTRAVENOUS | Status: AC
  Filled 2014-08-11: qty 250

## 2014-08-11 MED ORDER — OXYCODONE HCL 5 MG PO TABS
5.0000 mg | ORAL_TABLET | Freq: Once | ORAL | Status: AC
  Administered 2014-08-11: 21:00:00 5 mg via ORAL
  Filled 2014-08-11: qty 1

## 2014-08-11 MED ORDER — ATORVASTATIN CALCIUM 80 MG PO TABS
80.0000 mg | ORAL_TABLET | Freq: Every day | ORAL | Status: DC
  Administered 2014-08-11 – 2014-08-12 (×2): 80 mg via ORAL
  Filled 2014-08-11 (×2): qty 1

## 2014-08-11 MED ORDER — ASPIRIN EC 81 MG PO TBEC: 81.0000 mg | DELAYED_RELEASE_TABLET | Freq: Every day | ORAL | Status: DC

## 2014-08-11 MED ORDER — ASPIRIN 81 MG PO CHEW
81.0000 mg | CHEWABLE_TABLET | ORAL | Status: AC
  Administered 2014-08-12: 07:00:00 81 mg via ORAL
  Filled 2014-08-11: qty 1

## 2014-08-11 MED ORDER — ASPIRIN 300 MG RE SUPP
300.0000 mg | RECTAL | Status: DC
  Filled 2014-08-11: qty 1

## 2014-08-11 NOTE — Progress Notes (Signed)
Called physician for order for different pain medication.  Pt still complaining of headache.

## 2014-08-11 NOTE — H&P (Signed)
Patient ID: [name] MRN: [id number], DOB/AGE: [birth year]   Admit date: 08/11/2014   Primary Physician: No PCP Per Patient Primary Cardiologist: None (previous seen Dr. Haroldine Laws during last cath in 2007)  Pt. Profile:  53 year old obese Caucasian male with past medical history significant for history of nonobstructive coronary artery disease, hyperlipidemia, obesity, anxiety and history of palpitation presented with intermittent CP for last 2 days, and persistent CP for the last 9 hours  Problem List  Past Medical History  Diagnosis Date  . Hyperlipidemia   . CAD (coronary artery disease)   . Obesity   . Anxiety   . Palpitation     Past Surgical History  Procedure Laterality Date  . Cardiac catheterization       Allergies  Allergies no known allergies  HPI  The patient is a 53 year old obese Caucasian male with past medical history significant for history of nonobstructive coronary artery disease, hyperlipidemia, obesity, anxiety and history of palpitation. He also have a history of smoking however has quit in the last year. He had a significant family history of early coronary disease with his father died of heart attack in his 59s. His original cardiac catheterization on 10/08/2002 showed a 50% mid LAD stenosis, 30% mild left circumflex stenosis, 40% mid RCA stenosis. He had recurrent chest pain in 2007 prompting a repeat cardiac catheterization on 03/15/2006 which showed minimal nonobstructive coronary disease with 20% distal RCA stenosis, and EF 50%. His last echocardiogram in 2007 showed EF 50-55%, no regional wall motion abnormality.   Patient has been doing well in the past several years, his original metoprolol has been changed to [facility] by his PCP. He was taking Lipitor as well until a few months ago. According to the patient, due to his chronic pain, he has not been very active at home. He denies any recent lower extremity edema, orthopnea or paroxysmal  nocturnal dyspnea. He denies any recent fever, chill, or cough. Since 2 days ago, he began to have intermittent chest discomfort. His wife was worried that he has been under a lot of stress recently, and gave him benzodiazepines which helped with his chest discomfort. Patient was walking around in the house this morning morning around 9 AM shortly after waking up, he began to feel left-sided dull chest pain. The chest pain worsened over time prompting the patient to seek medical attention at [facility] urgent care. An EKG was obtained at urgent care which showed normal sinus rhythm with heart rate 78, no significant ST-T wave changes. Patient was transferred to [facility] for further evaluation.   At the time of this consult, patient has been having persistent chest pain for the past 9 hours with mild relief from nitroglycerin received at urgent care and nitro drip at [facility].   Home Medications  Prior to Admission medications   Not on File    Family History  Family History  Problem Relation Age of Onset  . Heart attack Father     father died of heart attach in his 54s    Social History  History   Social History  . Marital Status: Married    Spouse Name: N/A    Number of Children: N/A  . Years of Education: N/A   Occupational History  . Not on file.   Social History Main Topics  . Smoking status: Former Research scientist (life sciences)  . Smokeless tobacco: Not on file     Comment: quit 1 yr ago  . Alcohol Use: Yes  Comment: drink occasionally  . Drug Use: No  . Sexual Activity: Not on file   Other Topics Concern  . Not on file   Social History Narrative  . No narrative on file     Review of Systems General:  No chills, fever, night sweats or weight changes.  Cardiovascular:  No dyspnea on exertion, edema, orthopnea, palpitations, paroxysmal nocturnal dyspnea. +chest pain Dermatological: No rash, lesions/masses Respiratory: No cough, dyspnea Urologic: No hematuria, dysuria Abdominal:    No nausea, vomiting, diarrhea, bright red blood per rectum, melena, or hematemesis Neurologic:  No visual changes, wkns, changes in mental status. All other systems reviewed and are otherwise negative except as noted above.  Physical Exam  Blood pressure 140/99, pulse 88, temperature 98.5 F (36.9 C), temperature source Oral, resp. rate 20, SpO2 99.00%.  General: Pleasant, NAD Psych: Normal affect. Neuro: Alert and oriented X 3. Moves all extremities spontaneously. HEENT: Normal  Neck: Supple without bruits or JVD. Lungs:  Resp regular and unlabored, CTA. Heart: RRR no s3, s4, or murmurs. Abdomen: Soft, non-tender, non-distended, BS + x 4.  Extremities: No clubbing, cyanosis or edema. DP/PT/Radials 2+ and equal bilaterally.  Labs    Radiology/Studies  No results found.  ECG  Normal sinus rhythm with heart rate 80, no significant ST-T wave changes.  Echocardiogram  2007 showed EF 50-55%, no regional wall motion abnormality.      ASSESSMENT AND PLAN  1. Chest pain  - unclear if unstable angina vs anxiety related  - however given his cardiac risk factors include HLD, significant family h/o early CAD, h/o smoking, obesity, presenting symptom, it is reasonable to pursue cardiac catheterization (assume Cr is ok)  - currently no labs, will obtains labs overnight and trend enzyme.   - nitro gtt started for CP  - cath in AM unless Cr elevated  2. HLD 3. H/o nonobstructive CAD  - Cath 10/08/2002 showed a 50% mid LAD stenosis, 30% mild left circumflex stenosis, 40% mid RCA stenosis  - Cath 03/15/2006 minimal nonobstructive coronary disease with 20% distal RCA stenosis, and EF 50%  4. Significant family h/o early CAD 29. Obesity 6. Anxiety   Signed, Stephen Deforest, PA-C 08/11/2014, 6:23 PM  I have examined the patient and reviewed assessment and plan and discussed with patient.  Agree with above as stated.  Significant family history of coronary disease. Moderate disease by  prior cath. Persistent chest discomfort. Plan for cath. All questions about the procedure were answered. We discussed the plan of treatment with multiple family members.   Stephen Bee S.

## 2014-08-12 ENCOUNTER — Ambulatory Visit (HOSPITAL_COMMUNITY): Admit: 2014-08-12 | Payer: Self-pay | Admitting: Cardiovascular Disease

## 2014-08-12 ENCOUNTER — Encounter (HOSPITAL_COMMUNITY): Payer: Self-pay

## 2014-08-12 ENCOUNTER — Encounter (HOSPITAL_COMMUNITY)
Admission: EM | Disposition: A | Discharge status: Home / Self Care | Payer: Self-pay | Attending: Interventional Cardiology

## 2014-08-12 DIAGNOSIS — I2510 Atherosclerotic heart disease of native coronary artery without angina pectoris: Secondary | ICD-10-CM

## 2014-08-12 DIAGNOSIS — R0789 Other chest pain: Secondary | ICD-10-CM | POA: Diagnosis not present

## 2014-08-12 DIAGNOSIS — E119 Type 2 diabetes mellitus without complications: Secondary | ICD-10-CM

## 2014-08-12 HISTORY — PX: LEFT HEART CATHETERIZATION WITH CORONARY ANGIOGRAM: SHX5451

## 2014-08-12 LAB — LIPID PANEL
Cholesterol: 274 mg/dL — ABNORMAL HIGH (ref 0–200)
HDL: 35 mg/dL — ABNORMAL LOW (ref >39)
LDL Cholesterol: 161 mg/dL — ABNORMAL HIGH (ref 0–99)
TRIGLYCERIDES: 389 mg/dL — AB (ref <150)
Total CHOL/HDL Ratio: 7.8 RATIO
VLDL: 78 mg/dL — AB (ref 0–40)

## 2014-08-12 LAB — GLUCOSE, CAPILLARY
Glucose-Capillary: 145 mg/dL — ABNORMAL HIGH (ref 70–99)
Glucose-Capillary: 200 mg/dL — ABNORMAL HIGH (ref 70–99)

## 2014-08-12 LAB — TROPONIN I
Troponin I: <0.3 ng/mL (ref <0.30)
Troponin I: <0.3 ng/mL (ref <0.30)

## 2014-08-12 LAB — HEMOGLOBIN A1C
Hgb A1c MFr Bld: 7 % — ABNORMAL HIGH (ref <5.7)
Mean Plasma Glucose: 154 mg/dL — ABNORMAL HIGH (ref <117)

## 2014-08-12 SURGERY — LEFT HEART CATHETERIZATION WITH CORONARY ANGIOGRAM
Anesthesia: LOCAL

## 2014-08-12 MED ORDER — VERAPAMIL HCL 2.5 MG/ML IV SOLN
INTRAVENOUS | Status: AC
  Filled 2014-08-12: qty 2

## 2014-08-12 MED ORDER — ASPIRIN 81 MG PO CHEW: 81.0000 mg | CHEWABLE_TABLET | Freq: Every day | ORAL | Status: DC

## 2014-08-12 MED ORDER — HEPARIN SODIUM (PORCINE) 1000 UNIT/ML IJ SOLN
INTRAMUSCULAR | Status: AC
  Filled 2014-08-12: qty 1

## 2014-08-12 MED ORDER — LIDOCAINE HCL (PF) 1 % IJ SOLN
INTRAMUSCULAR | Status: AC
  Filled 2014-08-12: qty 30

## 2014-08-12 MED ORDER — LIVING WELL WITH DIABETES BOOK
Freq: Once | Status: AC
  Administered 2014-08-12: 17:00:00
  Filled 2014-08-12 (×2): qty 1

## 2014-08-12 MED ORDER — PANTOPRAZOLE SODIUM 40 MG PO TBEC
40.0000 mg | DELAYED_RELEASE_TABLET | Freq: Every day | ORAL | Status: DC
  Administered 2014-08-12: 18:00:00 40 mg via ORAL
  Filled 2014-08-12: qty 1

## 2014-08-12 MED ORDER — NITROGLYCERIN 1 MG/10 ML FOR IR/CATH LAB
INTRA_ARTERIAL | Status: AC
  Filled 2014-08-12: qty 10

## 2014-08-12 MED ORDER — MIDAZOLAM HCL 2 MG/2ML IJ SOLN
INTRAMUSCULAR | Status: AC
  Filled 2014-08-12: qty 2

## 2014-08-12 MED ORDER — FENTANYL CITRATE 0.05 MG/ML IJ SOLN
INTRAMUSCULAR | Status: AC
  Filled 2014-08-12: qty 2

## 2014-08-12 MED ORDER — PANTOPRAZOLE SODIUM 40 MG PO TBEC: 40.0000 mg | DELAYED_RELEASE_TABLET | Freq: Every day | ORAL | Status: DC

## 2014-08-12 MED ORDER — SODIUM CHLORIDE 0.9 % IV SOLN: INTRAVENOUS | Status: DC

## 2014-08-12 MED ORDER — HEPARIN (PORCINE) IN NACL 2-0.9 UNIT/ML-% IJ SOLN
INTRAMUSCULAR | Status: AC
  Filled 2014-08-12: qty 1500

## 2014-08-12 MED ORDER — INSULIN ASPART 100 UNIT/ML ~~LOC~~ SOLN
0.0000 [IU] | Freq: Three times a day (TID) | SUBCUTANEOUS | Status: DC
  Administered 2014-08-12: 2 [IU] via SUBCUTANEOUS
  Administered 2014-08-12: 1 [IU] via SUBCUTANEOUS

## 2014-08-12 MED ORDER — OXYCODONE HCL 5 MG PO TABS
5.0000 mg | ORAL_TABLET | Freq: Once | ORAL | Status: AC
  Administered 2014-08-12: 5 mg via ORAL
  Filled 2014-08-12: qty 1

## 2014-08-12 MED ORDER — ATORVASTATIN CALCIUM 80 MG PO TABS: 80.0000 mg | ORAL_TABLET | Freq: Every day | ORAL | Status: DC

## 2014-08-12 NOTE — CV Procedure (Signed)
      Cardiac Catheterization Operative Report  [name] [id number] 9/17/20152:09 PM Terald Sleeper, PA-C  Procedure Performed:  1. Left Heart Catheterization 2. Selective Coronary Angiography 3. Left ventricular angiogram  Operator: Lauree Chandler, MD  Arterial access site:  Right radial artery.   Indication: 53 yo male with history of mild CAD by cath 2007 admitted with chest pain, negative troponin. Ongoing pain overnight                                       Procedure Details: The risks, benefits, complications, treatment options, and expected outcomes were discussed with the patient. The patient and/or family concurred with the proposed plan, giving informed consent. The patient was brought to the cath lab after IV hydration was begun and oral premedication was given. The patient was further sedated with Versed and Fentanyl. The right wrist was assessed with a reverse Allens test which was positive. The right wrist was prepped and draped in a sterile fashion. 1% lidocaine was used for local anesthesia. Using the modified Seldinger access technique, a 5 French sheath was placed in the right radial artery. 3 mg Verapamil was given through the sheath. 5000 units IV heparin was given. Standard diagnostic catheters were used to perform selective coronary angiography. A pigtail catheter was used to perform a left ventricular angiogram. The sheath was removed from the right radial artery and a Terumo hemostasis band was applied at the arteriotomy site on the right wrist.    There were no immediate complications. The patient was taken to the recovery area in stable condition.   Hemodynamic Findings: Central aortic pressure: 108/71 Left ventricular pressure: 105/10/20  Angiographic Findings:  Left main: No obstructive disease.   Left Anterior Descending Artery: Large caliber vessel that courses to the apex. The mid vessel has a 40% stenosis just beyond the takeoff of a  septal perforating branch. Mild luminal irregularities distal vessel. Moderate caliber diagonal branch with no obstructive disease.   Circumflex Artery: Large caliber vessel with 30% mid stenosis in a tortuous segment of the vessel.  There are three very small caliber obtuse marginal branches followed by a large caliber bifurcating third OM branch.   Right Coronary Artery: Large caliber dominant vessel with 20% proximal stenosis. The PDA and posterolateral artery are patent.   Left Ventricular Angiogram: LVEF=55-60%.   Impression: 1. Mild non-obstructive CAD 2. Normal LV function 3. Non-cardiac chest pain.   Recommendations: Medical management of mild CAD. Consider GERD as cause of chest pain. Start PPI. Monitor overnight given ongoing chest pain.        Complications:  None. The patient tolerated the procedure well.

## 2014-08-12 NOTE — Discharge Summary (Signed)
Physician Discharge Summary     Cardiologist:  Irish Lack Patient ID: SHOICHI MIELKE MRN: [id number] DOB/AGE: 1961/08/11 53 y.o.  Admit date: 08/11/2014 Discharge date: 08/12/2014  Admission Diagnoses: Chest Pain  Discharge Diagnoses:  Principal Problem:   Chest pain Active Problems:   Intermediate coronary syndrome   Obesity   Anxiety   Hyperlipidemia   Diabetes mellitus type 2, uncomplicated   Discharged Condition: stable  Hospital Course:   The patient is a 53 year old obese Caucasian male with past medical history significant for history of nonobstructive coronary artery disease, hyperlipidemia, obesity, anxiety and history of palpitation. He also have a history of smoking however has quit in the last year. He had a significant family history of early coronary disease with his father died of heart attack in his 67s. His original cardiac catheterization on 10/08/2002 showed a 50% mid LAD stenosis, 30% mild left circumflex stenosis, 40% mid RCA stenosis. He had recurrent chest pain in 2007 prompting a repeat cardiac catheterization on 03/15/2006 which showed minimal nonobstructive coronary disease with 20% distal RCA stenosis, and EF 50%. His last echocardiogram in 2007 showed EF 50-55%, no regional wall motion abnormality.   Patient has been doing well in the past several years, his original metoprolol has been changed to [facility] by his PCP. He was taking Lipitor as well until a few months ago. According to the patient, due to his chronic pain, he has not been very active at home. He denies any recent lower extremity edema, orthopnea or paroxysmal nocturnal dyspnea. He denies any recent fever, chill, or cough. Since 2 days ago, he began to have intermittent chest discomfort. His wife was worried that he has been under a lot of stress recently, and gave him benzodiazepines which helped with his chest discomfort. Patient was walking around in the house this morning morning around 9 AM  shortly after waking up, he began to feel left-sided dull chest pain. The chest pain worsened over time prompting the patient to seek medical attention at [facility] urgent care. An EKG was obtained at urgent care which showed normal sinus rhythm with heart rate 78, no significant ST-T wave changes. Patient was transferred to [facility] for further evaluation.   At the time of this consult, patient has been having persistent chest pain for the past 9 hours with mild relief from nitroglycerin received at urgent care and nitro drip at [facility].  He underwent cardiac cath which revealed nonobstructive CAD(See cath note below).  It is thought his chest pain is related to anxiety.  He will be continued on Lexapro.  A1C came back at 7.0.  A diabetes educator was consulted and he was educated on the affects of diabetes.  He will be referred to an out patient RD and diabetes educator for weight loss needs.   The patient was seen by Dr. Marlou Porch who felt he was stable for DC home.  FU arranged.    Consults: Diabetes [place]oor  Significant Diagnostic Studies:   Cardiac Catheterization Operative Report  XAIDYN KEPNER  [id number]  9/17/20152:09 PM  Terald Sleeper, PA-C  Procedure Performed:  1. Left Heart Catheterization 2. Selective Coronary Angiography 3. Left ventricular angiogram Operator: Lauree Chandler, MD  Arterial access site: Right radial artery.  Indication: 53 yo male with history of mild CAD by cath 2007 admitted with chest pain, negative troponin. Ongoing pain overnight  Procedure Details:  The risks, benefits, complications, treatment options, and expected outcomes were discussed with the patient. The patient  and/or family concurred with the proposed plan, giving informed consent. The patient was brought to the cath lab after IV hydration was begun and oral premedication was given. The patient was further sedated with Versed and Fentanyl. The right wrist was assessed with a reverse Allens test  which was positive. The right wrist was prepped and draped in a sterile fashion. 1% lidocaine was used for local anesthesia. Using the modified Seldinger access technique, a 5 French sheath was placed in the right radial artery. 3 mg Verapamil was given through the sheath. 5000 units IV heparin was given. Standard diagnostic catheters were used to perform selective coronary angiography. A pigtail catheter was used to perform a left ventricular angiogram. The sheath was removed from the right radial artery and a Terumo hemostasis band was applied at the arteriotomy site on the right wrist.  There were no immediate complications. The patient was taken to the recovery area in stable condition.  Hemodynamic Findings:  Central aortic pressure: 108/71  Left ventricular pressure: 105/10/20  Angiographic Findings:  Left main: No obstructive disease.  Left Anterior Descending Artery: Large caliber vessel that courses to the apex. The mid vessel has a 40% stenosis just beyond the takeoff of a septal perforating branch. Mild luminal irregularities distal vessel. Moderate caliber diagonal branch with no obstructive disease.  Circumflex Artery: Large caliber vessel with 30% mid stenosis in a tortuous segment of the vessel. There are three very small caliber obtuse marginal branches followed by a large caliber bifurcating third OM branch.  Right Coronary Artery: Large caliber dominant vessel with 20% proximal stenosis. The PDA and posterolateral artery are patent.  Left Ventricular Angiogram: LVEF=55-60%.  Impression:  1. Mild non-obstructive CAD  2. Normal LV function  3. Non-cardiac chest pain.  Recommendations: Medical management of mild CAD. Consider GERD as cause of chest pain. Start PPI. Monitor overnight given ongoing chest pain.  Complications: None. The patient tolerated the procedure well.   Treatments: See above   Discharge Exam: Blood pressure 134/69, pulse 66, temperature 98.3 F (36.8 C),  temperature source Oral, resp. rate 18, weight 257 lb 11.5 oz (116.9 kg), SpO2 95.00%.   Disposition: Final discharge disposition not confirmed      Discharge Instructions   Ambulatory referral to Nutrition and Diabetic Education    Complete by:  As directed   New onset diabetes     Diet - low sodium heart healthy    Complete by:  As directed      Discharge instructions    Complete by:  As directed   No lifting with right arm for three days. Make diet and weight loss a priority in your life.  The dietician will call you next week.  She is also a Diabetes Educator and can help.     Increase activity slowly    Complete by:  As directed             Medication List         ALPRAZolam 0.25 MG tablet  Commonly known as:  XANAX  Take 0.25 mg by mouth at bedtime as needed for anxiety.     aspirin 325 MG EC tablet  Take 325 mg by mouth daily.     atorvastatin 80 MG tablet  Commonly known as:  LIPITOR  Take 80 mg by mouth daily.     escitalopram 20 MG tablet  Commonly known as:  LEXAPRO  Take 20 mg by mouth daily.     ezetimibe 10 MG  tablet  Commonly known as:  ZETIA  Take 10 mg by mouth daily.     Fish Oil 1000 MG Cpdr  Take 2,000 mg by mouth daily.     HYDROcodone-acetaminophen 5-325 MG per tablet  Commonly known as:  NORCO/VICODIN  Take 1 tablet by mouth every 6 (six) hours as needed for moderate pain.     naproxen 500 MG tablet  Commonly known as:  NAPROSYN  Take 500 mg by mouth daily.     nebivolol 5 MG tablet  Commonly known as:  BYSTOLIC  Take 5 mg by mouth daily.     pantoprazole 40 MG tablet  Commonly known as:  PROTONIX  Take 1 tablet (40 mg total) by mouth daily.     TRILIPIX 135 MG capsule  Generic drug:  Choline Fenofibrate  Take 135 mg by mouth daily.     Vitamin D 2000 UNITS tablet  Take 2,000 Units by mouth daily.       Follow-up Information   Follow up with Richardson Dopp, PA-C On 09/02/2014. (11:45 AM  )    Specialty:  Physician  Assistant   Contact information:   [address] [place] [address] [phone]      Greater than 30 minutes was spent completing the patient's discharge.    SignedTarri Fuller, [place] 08/12/2014, 3:56 PM   Personally seen and examined. Agree with above. PPI Cath reassuring. Discussed with family.  Candee Furbish, MD

## 2014-08-12 NOTE — Progress Notes (Signed)
Spoke with patient, wife and daughter regarding new diabetes diagnosis.  Discussed meal planning, exercise, treatment of low blood sugars and the need for blood glucose monitoring and potential long term complications of uncontrolled blood sugars.  Patient awake some of the time- daughter and wife receptive and asking very appropriate questions.  Outpatient diabetes education program [facility]) called during our visit to schedule outpatient education.  Encouraged wife to schedule follow up visit with family MD next week to evaluate blood sugars.  Discussed stress as a contributing factor to elevated blood sugar. Given the number for outpatient [facility] as an alternative number for diabetes education should they want to attend classes before their scheduled November meeting at the diabetes center here in [place]. As I left, RD was coming to educate the patient.   Gentry Fitz, RN, BA, MHA, CDE Diabetes Coordinator Inpatient Diabetes Program  [phone] (Team Pager) [phone] [facility]) 08/12/2014 5:01 PM

## 2014-08-12 NOTE — Progress Notes (Signed)
TR BAND REMOVAL  LOCATION:    right radial  DEFLATED PER PROTOCOL:    Yes.    TIME BAND OFF / DRESSING APPLIED:    1600   SITE UPON ARRIVAL:    Level 0  SITE AFTER BAND REMOVAL:    Level 0  REVERSE ALLEN'S TEST:     positive  CIRCULATION SENSATION AND MOVEMENT:    Within Normal Limits   Yes.    COMMENTS:   Tolerated procedure well 

## 2014-08-12 NOTE — Progress Notes (Signed)
Subjective: Continues to have 3/10 chest pressure.   Objective: Vital signs in last 24 hours: Temp:  [98 F (36.7 C)-98.5 F (36.9 C)] 98 F (36.7 C) (09/17 0900) Pulse Rate:  [63-88] 67 (09/17 0900) Resp:  [16-20] 20 (09/17 0900) BP: (97-141)/(44-99) 107/64 mmHg (09/17 0900) SpO2:  [93 %-99 %] 93 % (09/17 0600) Weight:  [257 lb 11.5 oz (116.9 kg)] 257 lb 11.5 oz (116.9 kg) (09/17 0000) Last BM Date: 08/11/14  Intake/Output from previous day: 09/16 0701 - 09/17 0700 In: 352.5 [P.O.:240; I.V.:112.5] Out: 900 [Urine:900] Intake/Output this shift:    Medications Current Facility-Administered Medications  Medication Dose Route Frequency Provider Last Rate Last Dose  . 0.9 %  sodium chloride infusion   Intravenous Continuous Jettie Booze, MD 75 mL/hr at 08/11/14 1730    . 0.9 %  sodium chloride infusion  250 mL Intravenous PRN Almyra Deforest, PA      . acetaminophen (TYLENOL) tablet 650 mg  650 mg Oral Q4H PRN Almyra Deforest, PA   650 mg at 08/12/14 0649  . ALPRAZolam Duanne Moron) tablet 0.25 mg  0.25 mg Oral QHS PRN Stephani Police, MD   0.25 mg at 08/11/14 2319  . aspirin chewable tablet 324 mg  324 mg Oral NOW Almyra Deforest, PA       Or  . aspirin suppository 300 mg  300 mg Rectal NOW Almyra Deforest, PA      . atorvastatin (LIPITOR) tablet 80 mg  80 mg Oral q1800 Stephani Police, MD   80 mg at 08/11/14 2320  . escitalopram (LEXAPRO) tablet 10 mg  10 mg Oral Daily Stephani Police, MD   10 mg at 08/12/14 0954  . heparin injection 5,000 Units  5,000 Units Subcutaneous 3 times per day Almyra Deforest, PA   5,000 Units at 08/12/14 [phone]  . metoprolol tartrate (LOPRESSOR) tablet 25 mg  25 mg Oral BID Almyra Deforest, PA   25 mg at 08/12/14 0954  . nitroGLYCERIN (NITROSTAT) SL tablet 0.4 mg  0.4 mg Sublingual Q5 Min x 3 PRN Almyra Deforest, PA      . nitroGLYCERIN 50 mg in dextrose 5 % 250 mL (0.2 mg/mL) infusion  10 mcg/min Intravenous Titrated Jettie Booze, MD 6 mL/hr at 08/11/14 2200 20 mcg/min at 08/11/14 2200  .  ondansetron (ZOFRAN) injection 4 mg  4 mg Intravenous Q6H PRN Almyra Deforest, PA   4 mg at 08/12/14 0657  . sodium chloride 0.9 % injection 3 mL  3 mL Intravenous Q12H Almyra Deforest, PA      . sodium chloride 0.9 % injection 3 mL  3 mL Intravenous PRN Almyra Deforest, PA        PE: General appearance: alert, cooperative and no distress Lungs: clear to auscultation bilaterally Heart: regular rate and rhythm, S1, S2 normal, no murmur, click, rub or gallop Extremities: No LEE Pulses: 2+ and symmetric Skin: Warm and dry Neurologic: Grossly normal  Lab Results:   Recent Labs  08/11/14 1857  WBC 10.7*  HGB 14.2  HCT 42.3  PLT 236   BMET  Recent Labs  08/11/14 1857  NA 137  K 4.0  CL 99  CO2 27  GLUCOSE 124*  BUN 18  CREATININE 0.84  CALCIUM 9.6   PT/INR  Recent Labs  08/11/14 1857  LABPROT 13.9  INR 1.07   Lipid Panel  No results found for this basename: chol, trig, hdl, cholhdl, vldl, ldlcalc     Assessment/Plan  53 year old obese  Caucasian male with past medical history significant for history of nonobstructive coronary artery disease, hyperlipidemia, obesity, anxiety and history of palpitation presented with intermittent CP for last 2 days, and persistent CP for the last 9 hours  1. Chest pain  Ruled out for MI.  Quit smoking last year.  Continues to have CP.  Increased NTG to 69mcg/min.   Left heart cath planned for today at 1330hrs. 2. HLD  On high dose statin.  Will check lipids now.  3. H/o nonobstructive CAD  - Cath 10/08/2002 showed a 50% mid LAD stenosis, 30% mild left circumflex stenosis, 40% mid RCA stenosis  - Cath 03/15/2006 minimal nonobstructive coronary disease with 20% distal RCA stenosis, and EF 50%  4. Significant family h/o early CAD  63. Obesity   See below. 6. Anxiety 7. Diabetes A1C 7.0.  New diagnosis.  We discussed dietary changes and exercise at length.  He and his wife are open to OP nutrition referral.  Recheck A1C in three months.  If better,  continue with lifestyle changes.  If not, add oral meds.    LOS: 1 day    HAGER, BRYAN PA-C 08/12/2014 10:03 AM  Agree with above. Cath  Candee Furbish, MD

## 2014-08-12 NOTE — H&P (View-Only) (Signed)
Subjective: Continues to have 3/10 chest pressure.   Objective: Vital signs in last 24 hours: Temp:  [98 F (36.7 C)-98.5 F (36.9 C)] 98 F (36.7 C) (09/17 0900) Pulse Rate:  [63-88] 67 (09/17 0900) Resp:  [16-20] 20 (09/17 0900) BP: (97-141)/(44-99) 107/64 mmHg (09/17 0900) SpO2:  [93 %-99 %] 93 % (09/17 0600) Weight:  [257 lb 11.5 oz (116.9 kg)] 257 lb 11.5 oz (116.9 kg) (09/17 0000) Last BM Date: 08/11/14  Intake/Output from previous day: 09/16 0701 - 09/17 0700 In: 352.5 [P.O.:240; I.V.:112.5] Out: 900 [Urine:900] Intake/Output this shift:    Medications Current Facility-Administered Medications  Medication Dose Route Frequency Provider Last Rate Last Dose  . 0.9 %  sodium chloride infusion   Intravenous Continuous Jettie Booze, MD 75 mL/hr at 08/11/14 1730    . 0.9 %  sodium chloride infusion  250 mL Intravenous PRN Almyra Deforest, PA      . acetaminophen (TYLENOL) tablet 650 mg  650 mg Oral Q4H PRN Almyra Deforest, PA   650 mg at 08/12/14 0649  . ALPRAZolam Duanne Moron) tablet 0.25 mg  0.25 mg Oral QHS PRN Stephani Police, MD   0.25 mg at 08/11/14 2319  . aspirin chewable tablet 324 mg  324 mg Oral NOW Almyra Deforest, PA       Or  . aspirin suppository 300 mg  300 mg Rectal NOW Almyra Deforest, PA      . atorvastatin (LIPITOR) tablet 80 mg  80 mg Oral q1800 Stephani Police, MD   80 mg at 08/11/14 2320  . escitalopram (LEXAPRO) tablet 10 mg  10 mg Oral Daily Stephani Police, MD   10 mg at 08/12/14 0954  . heparin injection 5,000 Units  5,000 Units Subcutaneous 3 times per day Almyra Deforest, PA   5,000 Units at 08/12/14 [phone]  . metoprolol tartrate (LOPRESSOR) tablet 25 mg  25 mg Oral BID Almyra Deforest, PA   25 mg at 08/12/14 0954  . nitroGLYCERIN (NITROSTAT) SL tablet 0.4 mg  0.4 mg Sublingual Q5 Min x 3 PRN Almyra Deforest, PA      . nitroGLYCERIN 50 mg in dextrose 5 % 250 mL (0.2 mg/mL) infusion  10 mcg/min Intravenous Titrated Jettie Booze, MD 6 mL/hr at 08/11/14 2200 20 mcg/min at 08/11/14 2200  .  ondansetron (ZOFRAN) injection 4 mg  4 mg Intravenous Q6H PRN Almyra Deforest, PA   4 mg at 08/12/14 0657  . sodium chloride 0.9 % injection 3 mL  3 mL Intravenous Q12H Almyra Deforest, PA      . sodium chloride 0.9 % injection 3 mL  3 mL Intravenous PRN Almyra Deforest, PA        PE: General appearance: alert, cooperative and no distress Lungs: clear to auscultation bilaterally Heart: regular rate and rhythm, S1, S2 normal, no murmur, click, rub or gallop Extremities: No LEE Pulses: 2+ and symmetric Skin: Warm and dry Neurologic: Grossly normal  Lab Results:   Recent Labs  08/11/14 1857  WBC 10.7*  HGB 14.2  HCT 42.3  PLT 236   BMET  Recent Labs  08/11/14 1857  NA 137  K 4.0  CL 99  CO2 27  GLUCOSE 124*  BUN 18  CREATININE 0.84  CALCIUM 9.6   PT/INR  Recent Labs  08/11/14 1857  LABPROT 13.9  INR 1.07   Lipid Panel  No results found for this basename: chol, trig, hdl, cholhdl, vldl, ldlcalc     Assessment/Plan  53 year old obese  Caucasian male with past medical history significant for history of nonobstructive coronary artery disease, hyperlipidemia, obesity, anxiety and history of palpitation presented with intermittent CP for last 2 days, and persistent CP for the last 9 hours  1. Chest pain  Ruled out for MI.  Quit smoking last year.  Continues to have CP.  Increased NTG to 27mcg/min.   Left heart cath planned for today at 1330hrs. 2. HLD  On high dose statin.  Will check lipids now.  3. H/o nonobstructive CAD  - Cath 10/08/2002 showed a 50% mid LAD stenosis, 30% mild left circumflex stenosis, 40% mid RCA stenosis  - Cath 03/15/2006 minimal nonobstructive coronary disease with 20% distal RCA stenosis, and EF 50%  4. Significant family h/o early CAD  11. Obesity   See below. 6. Anxiety 7. Diabetes A1C 7.0.  New diagnosis.  We discussed dietary changes and exercise at length.  He and his wife are open to OP nutrition referral.  Recheck A1C in three months.  If better,  continue with lifestyle changes.  If not, add oral meds.    LOS: 1 day    HAGER, BRYAN PA-C 08/12/2014 10:03 AM  Agree with above. Cath  Candee Furbish, MD

## 2014-08-12 NOTE — Interval H&P Note (Signed)
History and Physical Interval Note:  08/12/2014 1:42 PM  [name]  has presented today for cardiac cath with the diagnosis of chest pain  The various methods of treatment have been discussed with the patient and family. After consideration of risks, benefits and other options for treatment, the patient has consented to  Procedure(s): LEFT HEART CATHETERIZATION WITH CORONARY ANGIOGRAM (N/A) as a surgical intervention .  The patient's history has been reviewed, patient examined, no change in status, stable for surgery.  I have reviewed the patient's chart and labs.  Questions were answered to the patient's satisfaction.    Cath Lab Visit (complete for each Cath Lab visit)  Clinical Evaluation Leading to the Procedure:   ACS: No.  Non-ACS:    Anginal Classification: CCS III  Anti-ischemic medical therapy: Minimal Therapy (1 class of medications)  Non-Invasive Test Results: No non-invasive testing performed  Prior CABG: No previous CABG        MCALHANY,CHRISTOPHER

## 2014-08-12 NOTE — Progress Notes (Signed)
Utilization Review Completed.Malike Foglio T9/17/2015  

## 2014-08-13 ENCOUNTER — Encounter: Payer: Self-pay | Admitting: Nurse Practitioner

## 2014-09-02 ENCOUNTER — Encounter: Payer: Self-pay | Admitting: Physician Assistant

## 2014-09-08 ENCOUNTER — Encounter: Payer: Self-pay | Admitting: *Deleted

## 2014-09-15 ENCOUNTER — Encounter: Payer: Self-pay | Admitting: Physician Assistant

## 2014-09-15 NOTE — Progress Notes (Deleted)
Cardiology Office Note   Date:  09/15/2014   ID:  [name], DOB 03/09/1961, MRN [id number]  PCP:  Terald Sleeper, PA-C  Cardiologist:  Dr. Casandra Doffing   Electrophysiologist:  ***   History of Present Illness: [name] is a 53 y.o. male ***   Studies:  - LHC (***):  ***  - Echo (***):  ***  - Nuclear (***):  ***  - Carotid US (***):  ***   Recent Labs/Images:   Recent Labs  08/11/14 1857 08/12/14 0342  NA 137  --   K 4.0  --   BUN 18  --   CREATININE 0.84  --   ALT 57*  --   HGB 14.2  --   LDLCALC  --  161*  HDL  --  35*      Wt Readings from Last 3 Encounters:  08/12/14 257 lb 11.5 oz (116.9 kg)  08/12/14 257 lb 11.5 oz (116.9 kg)  09/30/13 246 lb (111.585 kg)     Past Medical History  Diagnosis Date  . Hyperlipidemia   . CAD (coronary artery disease)   . Obesity   . Anxiety   . Palpitation   . Intermediate coronary syndrome   . Kidney stone     "passed it"  . JIRCVELF(810.1)     "monthly" (08/11/2014)    Current Outpatient Prescriptions  Medication Sig Dispense Refill  . ALPRAZolam (XANAX) 0.25 MG tablet Take 1 tablet (0.25 mg total) by mouth at bedtime as needed for anxiety.  30 tablet  1  . ALPRAZolam (XANAX) 0.25 MG tablet Take 0.25 mg by mouth at bedtime as needed for anxiety.      Marland Kitchen aspirin 325 MG EC tablet Take 325 mg by mouth daily.      Marland Kitchen aspirin 325 MG tablet Take 325 mg by mouth daily.      Marland Kitchen atorvastatin (LIPITOR) 80 MG tablet Take 1 tablet (80 mg total) by mouth daily at 6 PM.  90 tablet  1  . atorvastatin (LIPITOR) 80 MG tablet Take 1 tablet (80 mg total) by mouth daily.  30 tablet  11  . cholecalciferol (VITAMIN D) 1000 UNITS tablet Take 1,000 Units by mouth daily.      . Cholecalciferol (VITAMIN D) 2000 UNITS tablet Take 2,000 Units by mouth daily.      . Choline Fenofibrate (TRILIPIX) 135 MG capsule Take 135 mg by mouth daily.      . Choline Fenofibrate 135 MG capsule Take 1 capsule (135 mg total) by mouth daily.   90 capsule  1  . escitalopram (LEXAPRO) 20 MG tablet Take 1 tablet (20 mg total) by mouth daily.  90 tablet  1  . escitalopram (LEXAPRO) 20 MG tablet Take 20 mg by mouth daily.      Marland Kitchen ezetimibe (ZETIA) 10 MG tablet Take 10 mg by mouth daily.      . fish oil-omega-3 fatty acids 1000 MG capsule Take 2 g by mouth daily.      Marland Kitchen HYDROcodone-acetaminophen (NORCO/VICODIN) 5-325 MG per tablet Take 1 tablet by mouth every 6 (six) hours as needed for moderate pain.      . naproxen (NAPROSYN) 500 MG tablet Take 500 mg by mouth daily.      . nebivolol (BYSTOLIC) 5 MG tablet Take 1 tablet (5 mg total) by mouth daily.  90 tablet  1  . nebivolol (BYSTOLIC) 5 MG tablet Take 5 mg by mouth daily.      Marland Kitchen  Omega-3 Fatty Acids (FISH OIL) 1000 MG CPDR Take 2,000 mg by mouth daily.       . pantoprazole (PROTONIX) 40 MG tablet Take 1 tablet (40 mg total) by mouth daily.  30 tablet  5  . ZETIA 10 MG tablet TAKE 1 TABLET DAILY  30 tablet  0   No current facility-administered medications for this visit.     Allergies:   Review of patient's allergies indicates no known allergies.   Social History:  The patient  reports that he quit smoking about 13 months ago. His smoking use included Cigarettes. He has a 70 pack-year smoking history. He quit smokeless tobacco use about 13 months ago. His smokeless tobacco use included Snuff. He reports that he drinks alcohol. He reports that he does not use illicit drugs.   Family History:  The patient's family history includes Heart attack in his father.   ROS:  Please see the history of present illness.   ***   All other systems reviewed and negative.    PHYSICAL EXAM: VS:  There were no vitals taken for this visit. Well nourished, well developed, in no acute distress HEENT: normal Neck: ***no JVD Cardiac:  normal S1, S2; ***RRR; no murmur Lungs:  ***clear to auscultation bilaterally, no wheezing, rhonchi or rales Abd: soft, nontender, no hepatomegaly Ext: ***no edema Skin:  warm and dry Neuro:  CNs 2-12 intact, no focal abnormalities noted  EKG:  ***      ASSESSMENT AND PLAN:  No diagnosis found.   Disposition:   FU with ***   Signed, Richardson Dopp, PA-C, MHS 09/15/2014 9:24 AM    [facility] [address], [place], [place]  [address] Phone: [phone]; Fax: [phone]

## 2014-09-17 NOTE — Progress Notes (Signed)
This encounter was created in error - please disregard.

## 2014-09-22 ENCOUNTER — Ambulatory Visit: Payer: Self-pay | Admitting: *Deleted

## 2014-09-30 ENCOUNTER — Ambulatory Visit: Payer: Self-pay | Admitting: *Deleted

## 2014-11-01 ENCOUNTER — Other Ambulatory Visit: Payer: Self-pay | Admitting: Nurse Practitioner

## 2014-11-04 ENCOUNTER — Encounter (HOSPITAL_COMMUNITY): Payer: Self-pay | Admitting: Cardiovascular Disease

## 2014-11-26 DIAGNOSIS — Q453 Other congenital malformations of pancreas and pancreatic duct: Secondary | ICD-10-CM

## 2014-11-26 HISTORY — DX: Other congenital malformations of pancreas and pancreatic duct: Q45.3

## 2015-01-06 ENCOUNTER — Encounter: Payer: Self-pay | Admitting: Internal Medicine

## 2015-02-22 ENCOUNTER — Encounter: Payer: Self-pay | Admitting: *Deleted

## 2015-02-25 DIAGNOSIS — Z8719 Personal history of other diseases of the digestive system: Secondary | ICD-10-CM

## 2015-02-25 HISTORY — DX: Personal history of other diseases of the digestive system: Z87.19

## 2015-03-02 ENCOUNTER — Ambulatory Visit (INDEPENDENT_AMBULATORY_CARE_PROVIDER_SITE_OTHER): Payer: PRIVATE HEALTH INSURANCE | Admitting: Internal Medicine

## 2015-03-02 ENCOUNTER — Other Ambulatory Visit (INDEPENDENT_AMBULATORY_CARE_PROVIDER_SITE_OTHER): Payer: PRIVATE HEALTH INSURANCE

## 2015-03-02 ENCOUNTER — Encounter: Payer: Self-pay | Admitting: Internal Medicine

## 2015-03-02 VITALS — BP 100/70 | HR 60 | Ht 69.5 in | Wt 228.1 lb

## 2015-03-02 DIAGNOSIS — R1013 Epigastric pain: Secondary | ICD-10-CM

## 2015-03-02 DIAGNOSIS — Z1211 Encounter for screening for malignant neoplasm of colon: Secondary | ICD-10-CM

## 2015-03-02 DIAGNOSIS — R110 Nausea: Secondary | ICD-10-CM

## 2015-03-02 LAB — CBC WITH DIFFERENTIAL/PLATELET
BASOS ABS: 0 10*3/uL (ref 0.0–0.1)
BASOS PCT: 0.4 % (ref 0.0–3.0)
EOS ABS: 0.2 10*3/uL (ref 0.0–0.7)
Eosinophils Relative: 2.6 % (ref 0.0–5.0)
HCT: 43.9 % (ref 39.0–52.0)
Hemoglobin: 14.6 g/dL (ref 13.0–17.0)
LYMPHS PCT: 36.8 % (ref 12.0–46.0)
Lymphs Abs: 2.7 10*3/uL (ref 0.7–4.0)
MCHC: 33.1 g/dL (ref 30.0–36.0)
MCV: 86.3 fl (ref 78.0–100.0)
Monocytes Absolute: 0.5 10*3/uL (ref 0.1–1.0)
Monocytes Relative: 7.3 % (ref 3.0–12.0)
Neutro Abs: 3.9 10*3/uL (ref 1.4–7.7)
Neutrophils Relative %: 52.9 % (ref 43.0–77.0)
PLATELETS: 227 10*3/uL (ref 150.0–400.0)
RBC: 5.09 Mil/uL (ref 4.22–5.81)
RDW: 13.6 % (ref 11.5–15.5)
WBC: 7.4 10*3/uL (ref 4.0–10.5)

## 2015-03-02 LAB — COMPREHENSIVE METABOLIC PANEL
ALK PHOS: 53 U/L (ref 39–117)
ALT: 27 U/L (ref 0–53)
AST: 25 U/L (ref 0–37)
Albumin: 4.2 g/dL (ref 3.5–5.2)
BILIRUBIN TOTAL: 0.4 mg/dL (ref 0.2–1.2)
BUN: 22 mg/dL (ref 6–23)
CO2: 28 mEq/L (ref 19–32)
Calcium: 9.5 mg/dL (ref 8.4–10.5)
Chloride: 105 mEq/L (ref 96–112)
Creatinine, Ser: 0.96 mg/dL (ref 0.40–1.50)
GFR: 87 mL/min (ref >60.00)
GLUCOSE: 107 mg/dL — AB (ref 70–99)
Potassium: 4.1 mEq/L (ref 3.5–5.1)
Sodium: 137 mEq/L (ref 135–145)
TOTAL PROTEIN: 7.3 g/dL (ref 6.0–8.3)

## 2015-03-02 LAB — IGA: IGA: 103 mg/dL (ref 68–378)

## 2015-03-02 LAB — LIPASE: Lipase: 48 U/L (ref 11.0–59.0)

## 2015-03-02 NOTE — Progress Notes (Signed)
Patient ID: [name], male   DOB: 06-Aug-1961, 54 y.o.   MRN: [id number] HPI: [name] is a 54 year old male with a past medical history of CAD, hyperlipidemia, diabetes, GERD and degenerative disc disease who seen in consultation at the request of Stephen Nearing, PA-C to evaluate epigastric abdominal pain and consider colorectal cancer screening. He is here today with his wife. He reports that over the last several months he has been dealing with epigastric abdominal pain and pressure. This feels like a soreness. On one occasion it was particular bad and he went to the hospital by EMS. His heart was evaluated and was told this was not angina or acute MI. It can be associated with nausea but no vomiting. When the pain is more intense it decreases his appetite but in between episodes appetite is normal. Again this discomfort comes and goes it can last hours to a few days. Doesn't seem to relate to eating without specific food trigger. Is not nocturnal. Bowel movements a been regular that he does occasionally see bright red blood with wiping which he has attributed to hemorrhoids. He denies melena or hematochezia. No change in bowel movements. Occasionally he has tried Maalox for this pain and it has helped. He took Protonix for a short time without benefit and he is now taking omeprazole 40 mg daily and as needed hyoscyamine. These drugs also seem to help somewhat but have not completely eliminated his discomfort. He has lost 30 pounds intentionally after being diagnosed with diabetes. He reports his sugars are now diet controlled. Denies family history of GI tract malignancy or IBD. Strong family history of CAD. He has never had endoscopic procedure  Past Medical History  Diagnosis Date  . Hyperlipidemia   . CAD (coronary artery disease)   . Obesity   . Anxiety   . Palpitation   . Intermediate coronary syndrome   . Kidney stone     "passed it"  . HYWVPXTG(626.9)     "monthly" (08/11/2014)  . Fatty  liver   . GERD (gastroesophageal reflux disease)   . Depression   . DDD (degenerative disc disease), lumbar   . DM (diabetes mellitus)     diet controlled    Past Surgical History  Procedure Laterality Date  . Tonsillectomy  1979  . Posterior lumbar fusion  2010?  Marland Kitchen Cardiac catheterization  2007  . Left heart catheterization with coronary angiogram N/A 08/12/2014    Procedure: LEFT HEART CATHETERIZATION WITH CORONARY ANGIOGRAM;  Surgeon: Burnell Blanks, MD;  [facility];  Service: Cardiovascular;  Laterality: N/A;    Outpatient Prescriptions Prior to Visit  Medication Sig Dispense Refill  . ALPRAZolam (XANAX) 0.25 MG tablet Take 1 tablet (0.25 mg total) by mouth at bedtime as needed for anxiety. 30 tablet 1  . aspirin 325 MG tablet Take 325 mg by mouth daily.    Marland Kitchen atorvastatin (LIPITOR) 80 MG tablet Take 1 tablet (80 mg total) by mouth daily. 30 tablet 11  . Cholecalciferol (VITAMIN D) 2000 UNITS tablet Take 2,000 Units by mouth daily.    . Choline Fenofibrate 135 MG capsule Take 1 capsule (135 mg total) by mouth daily. 90 capsule 1  . escitalopram (LEXAPRO) 20 MG tablet Take 1 tablet (20 mg total) by mouth daily. 90 tablet 1  . HYDROcodone-acetaminophen (NORCO/VICODIN) 5-325 MG per tablet Take 1 tablet by mouth every 6 (six) hours as needed for moderate pain.    . naproxen (NAPROSYN) 500 MG tablet Take  500 mg by mouth daily.    . nebivolol (BYSTOLIC) 5 MG tablet Take 1 tablet (5 mg total) by mouth daily. 90 tablet 1  . Omega-3 Fatty Acids (FISH OIL) 1000 MG CPDR Take 2,000 mg by mouth daily.     Marland Kitchen ALPRAZolam (XANAX) 0.25 MG tablet Take 0.25 mg by mouth at bedtime as needed for anxiety.    Marland Kitchen aspirin 325 MG EC tablet Take 325 mg by mouth daily.    Marland Kitchen atorvastatin (LIPITOR) 80 MG tablet Take 1 tablet (80 mg total) by mouth daily at 6 PM. 90 tablet 1  . cholecalciferol (VITAMIN D) 1000 UNITS tablet Take 1,000 Units by mouth daily.    . Choline Fenofibrate (TRILIPIX) 135  MG capsule Take 135 mg by mouth daily.    Marland Kitchen escitalopram (LEXAPRO) 20 MG tablet Take 20 mg by mouth daily.    Marland Kitchen ezetimibe (ZETIA) 10 MG tablet Take 10 mg by mouth daily.    . fish oil-omega-3 fatty acids 1000 MG capsule Take 2 g by mouth daily.    . nebivolol (BYSTOLIC) 5 MG tablet Take 5 mg by mouth daily.    . pantoprazole (PROTONIX) 40 MG tablet Take 1 tablet (40 mg total) by mouth daily. (Patient not taking: Reported on 03/02/2015) 30 tablet 5  . ZETIA 10 MG tablet TAKE 1 TABLET DAILY 30 tablet 0   No facility-administered medications prior to visit.    No Known Allergies  Family History  Problem Relation Age of Onset  . Heart attack Father     father died of heart attach in his 45s  . Lung cancer Mother   . Hyperlipidemia Father   . Hyperlipidemia Mother   . Alzheimer's disease Maternal Grandmother     History  Substance Use Topics  . Smoking status: Former Smoker -- 2.00 packs/day for 35 years    Types: Cigarettes    Quit date: 07/27/2013  . Smokeless tobacco: Former Systems developer    Types: Snuff    Quit date: 07/27/2013  . Alcohol Use: 0.0 oz/week    0 Standard drinks or equivalent per week     Comment: 08/11/2014 "I drink a few times/year'    ROS: As per history of present illness, otherwise negative  BP 100/70 mmHg  Pulse 60  Ht 5' 9.5" (1.765 m)  Wt 228 lb 2 oz (103.477 kg)  BMI 33.22 kg/m2 Constitutional: Well-developed and well-nourished. No distress. HEENT: Normocephalic and atraumatic. Oropharynx is clear and moist. No oropharyngeal exudate. Conjunctivae are normal.  No scleral icterus. Neck: Neck supple. Trachea midline. Cardiovascular: Normal rate, regular rhythm and intact distal pulses. No M/R/G Pulmonary/chest: Effort normal and breath sounds normal. No wheezing, rales or rhonchi. Abdominal: Soft, nontender, nondistended. Bowel sounds active throughout. There are no masses palpable. No hepatosplenomegaly. Extremities: no clubbing, cyanosis, or  edema Lymphadenopathy: No cervical adenopathy noted. Neurological: Alert and oriented to person place and time. Skin: Skin is warm and dry. No rashes noted. Psychiatric: Normal mood and affect. Behavior is normal.  RELEVANT LABS AND IMAGING: CBC    Component Value Date/Time   WBC 10.7* 08/11/2014 1857   RBC 4.92 08/11/2014 1857   HGB 14.2 08/11/2014 1857   HCT 42.3 08/11/2014 1857   PLT 236 08/11/2014 1857   MCV 86.0 08/11/2014 1857   MCH 28.9 08/11/2014 1857   MCHC 33.6 08/11/2014 1857   RDW 12.6 08/11/2014 1857   LYMPHSABS 2.1 08/11/2014 1857   MONOABS 0.7 08/11/2014 1857   EOSABS 0.1 08/11/2014 1857  BASOSABS 0.0 08/11/2014 1857    CMP     Component Value Date/Time   NA 137 08/11/2014 1857   NA 142 09/30/2013 1153   K 4.0 08/11/2014 1857   CL 99 08/11/2014 1857   CO2 27 08/11/2014 1857   GLUCOSE 124* 08/11/2014 1857   GLUCOSE 100* 09/30/2013 1153   BUN 18 08/11/2014 1857   BUN 18 09/30/2013 1153   CREATININE 0.84 08/11/2014 1857   CALCIUM 9.6 08/11/2014 1857   PROT 7.1 08/11/2014 1857   PROT 6.6 09/30/2013 1153   ALBUMIN 3.9 08/11/2014 1857   AST 34 08/11/2014 1857   ALT 57* 08/11/2014 1857   ALKPHOS 60 08/11/2014 1857   BILITOT 0.2* 08/11/2014 1857   GFRNONAA >90 08/11/2014 1857   GFRAA >90 08/11/2014 1857    ASSESSMENT/PLAN: 54 year old male with a past medical history of CAD, hyperlipidemia, diabetes, GERD and degenerative disc disease who seen in consultation at the request of Stephen Nearing, PA-C to evaluate epigastric abdominal pain and consider colorectal cancer screening.  1. Epigastric pain/pressure/nausea -- differential dyspepsia, ulcer disease, gallbladder disease, gastroparesis. Upper endoscopy recommended. Risks and benefits discussed and he agrees to proceed. Abdominal ultrasound to rule out gallstones. CBC, CMP, lipase, celiac panel today. Continue omeprazole 40 g daily and as needed Levbid  2. CRC screening -- average risk, never yet screened.  Colonoscopy recommended and he agrees after discussion of the risks and benefits.   CC: [name], [place] [address] [place], [place] [address] Telephone [phone] Fax [phone]

## 2015-03-02 NOTE — Patient Instructions (Addendum)
You have been scheduled for an endoscopy and colonoscopy. Please follow the written instructions given to you at your visit today. Please pick up your prep supplies at the pharmacy within the next 1-3 days. If you use inhalers (even only as needed), please bring them with you on the day of your procedure. Your physician has requested that you go to www.startemmi.com and enter the access code given to you at your visit today. This web site gives a general overview about your procedure. However, you should still follow specific instructions given to you by our office regarding your preparation for the procedure.  Your physician has requested that you go to the basement for the following lab work before leaving today: CBC, CMET, Lipase, Celiac Panel  Please continue omeprazole 40 mg and Levbid as needed.  You have been scheduled for an abdominal ultrasound at [facility]iology (1st floor of hospital) on Tuesday 03/08/15, at 8:00 am. Please arrive 15 minutes prior to your appointment for registration. Make certain not to have anything to eat or drink 6 hours prior to your appointment. Should you need to reschedule your appointment, please contact radiology at [phone]. This test typically takes about 30 minutes to perform.

## 2015-03-04 LAB — TISSUE TRANSGLUTAMINASE, IGA: TISSUE TRANSGLUTAMINASE AB, IGA: 1 U/mL (ref <4)

## 2015-03-07 ENCOUNTER — Encounter: Payer: Self-pay | Admitting: Internal Medicine

## 2015-03-08 ENCOUNTER — Ambulatory Visit (HOSPITAL_COMMUNITY): Payer: PRIVATE HEALTH INSURANCE

## 2015-03-09 ENCOUNTER — Ambulatory Visit (HOSPITAL_COMMUNITY)
Admission: RE | Admit: 2015-03-09 | Discharge: 2015-03-09 | Disposition: A | Discharge status: Home / Self Care | Payer: PRIVATE HEALTH INSURANCE | Source: Ambulatory Visit | Attending: Internal Medicine | Admitting: Internal Medicine

## 2015-03-09 DIAGNOSIS — R1013 Epigastric pain: Secondary | ICD-10-CM | POA: Diagnosis present

## 2015-03-09 DIAGNOSIS — R112 Nausea with vomiting, unspecified: Secondary | ICD-10-CM | POA: Diagnosis not present

## 2015-03-09 DIAGNOSIS — Z1211 Encounter for screening for malignant neoplasm of colon: Secondary | ICD-10-CM

## 2015-03-09 DIAGNOSIS — R110 Nausea: Secondary | ICD-10-CM

## 2015-03-11 ENCOUNTER — Encounter: Payer: Self-pay | Admitting: Internal Medicine

## 2015-03-17 ENCOUNTER — Other Ambulatory Visit: Payer: Self-pay

## 2015-03-17 ENCOUNTER — Telehealth: Payer: Self-pay | Admitting: Internal Medicine

## 2015-03-17 DIAGNOSIS — R1084 Generalized abdominal pain: Secondary | ICD-10-CM

## 2015-03-17 MED ORDER — TRAMADOL HCL 50 MG PO TABS: 50.0000 mg | ORAL_TABLET | Freq: Four times a day (QID) | ORAL | Status: DC | PRN

## 2015-03-17 MED ORDER — SUCRALFATE 1 GM/10ML PO SUSP: 1.0000 g | Freq: Four times a day (QID) | ORAL | Status: DC

## 2015-03-17 NOTE — Telephone Encounter (Signed)
Pts wife aware, scripts sent to pharmacy and orders in epic. Pt to come for labs tomorrow.

## 2015-03-17 NOTE — Telephone Encounter (Signed)
Pts wife states that he is having problems with abdominal pain. States he has been taking omeprazole and bentyl but they do not seem to be helping. States when this started the pain was in the epigastric area but now it is across his entire abdomen. Pt is scheduled for [facility] 04/01/15 but would like to know what else he can do until then. Wife wanted to know if labs might need to be done for H pylori. Please advise.

## 2015-03-17 NOTE — Telephone Encounter (Signed)
Please check H. pylori serum antibody, CBC, CMP, amylase and lipase Increase omeprazole to 40 mg twice daily before breakfast and dinner Add Carafate 1 g before meals and at bedtime Tramadol 50 every 6 hours as needed for pain until further workup with endoscopy colonoscopy (#30)

## 2015-03-19 ENCOUNTER — Encounter (HOSPITAL_COMMUNITY): Payer: Self-pay

## 2015-03-19 ENCOUNTER — Inpatient Hospital Stay (HOSPITAL_COMMUNITY)
Admission: EM | Admit: 2015-03-19 | Discharge: 2015-03-23 | DRG: 440 | Disposition: A | Discharge status: Home / Self Care | Payer: PRIVATE HEALTH INSURANCE | Attending: Internal Medicine | Admitting: Internal Medicine

## 2015-03-19 DIAGNOSIS — K859 Acute pancreatitis without necrosis or infection, unspecified: Secondary | ICD-10-CM | POA: Diagnosis present

## 2015-03-19 DIAGNOSIS — R109 Unspecified abdominal pain: Secondary | ICD-10-CM | POA: Diagnosis not present

## 2015-03-19 DIAGNOSIS — E119 Type 2 diabetes mellitus without complications: Secondary | ICD-10-CM | POA: Diagnosis present

## 2015-03-19 DIAGNOSIS — K219 Gastro-esophageal reflux disease without esophagitis: Secondary | ICD-10-CM | POA: Diagnosis present

## 2015-03-19 DIAGNOSIS — R1013 Epigastric pain: Secondary | ICD-10-CM | POA: Insufficient documentation

## 2015-03-19 DIAGNOSIS — Z7982 Long term (current) use of aspirin: Secondary | ICD-10-CM

## 2015-03-19 DIAGNOSIS — E785 Hyperlipidemia, unspecified: Secondary | ICD-10-CM | POA: Insufficient documentation

## 2015-03-19 DIAGNOSIS — F418 Other specified anxiety disorders: Secondary | ICD-10-CM | POA: Insufficient documentation

## 2015-03-19 DIAGNOSIS — Q439 Congenital malformation of intestine, unspecified: Secondary | ICD-10-CM

## 2015-03-19 DIAGNOSIS — Z87442 Personal history of urinary calculi: Secondary | ICD-10-CM

## 2015-03-19 DIAGNOSIS — Q438 Other specified congenital malformations of intestine: Secondary | ICD-10-CM

## 2015-03-19 DIAGNOSIS — I2510 Atherosclerotic heart disease of native coronary artery without angina pectoris: Secondary | ICD-10-CM | POA: Diagnosis present

## 2015-03-19 DIAGNOSIS — R935 Abnormal findings on diagnostic imaging of other abdominal regions, including retroperitoneum: Secondary | ICD-10-CM | POA: Insufficient documentation

## 2015-03-19 DIAGNOSIS — K760 Fatty (change of) liver, not elsewhere classified: Secondary | ICD-10-CM | POA: Diagnosis present

## 2015-03-19 DIAGNOSIS — I10 Essential (primary) hypertension: Secondary | ICD-10-CM | POA: Insufficient documentation

## 2015-03-19 DIAGNOSIS — Z87891 Personal history of nicotine dependence: Secondary | ICD-10-CM

## 2015-03-19 LAB — CBC WITH DIFFERENTIAL/PLATELET
BASOS ABS: 0 10*3/uL (ref 0.0–0.1)
Basophils Relative: 0 % (ref 0–1)
Eosinophils Absolute: 0.1 10*3/uL (ref 0.0–0.7)
Eosinophils Relative: 0 % (ref 0–5)
HEMATOCRIT: 42.7 % (ref 39.0–52.0)
Hemoglobin: 14.3 g/dL (ref 13.0–17.0)
LYMPHS PCT: 14 % (ref 12–46)
Lymphs Abs: 2.1 10*3/uL (ref 0.7–4.0)
MCH: 29.1 pg (ref 26.0–34.0)
MCHC: 33.5 g/dL (ref 30.0–36.0)
MCV: 86.8 fL (ref 78.0–100.0)
Monocytes Absolute: 1.2 10*3/uL — ABNORMAL HIGH (ref 0.1–1.0)
Monocytes Relative: 9 % (ref 3–12)
Neutro Abs: 11 10*3/uL — ABNORMAL HIGH (ref 1.7–7.7)
Neutrophils Relative %: 77 % (ref 43–77)
PLATELETS: 241 10*3/uL (ref 150–400)
RBC: 4.92 MIL/uL (ref 4.22–5.81)
RDW: 12.9 % (ref 11.5–15.5)
WBC: 14.4 10*3/uL — AB (ref 4.0–10.5)

## 2015-03-19 LAB — COMPREHENSIVE METABOLIC PANEL
ALK PHOS: 53 U/L (ref 39–117)
ALT: 20 U/L (ref 0–53)
AST: 19 U/L (ref 0–37)
Albumin: 3.9 g/dL (ref 3.5–5.2)
Anion gap: 11 (ref 5–15)
BUN: 16 mg/dL (ref 6–23)
CO2: 23 mmol/L (ref 19–32)
Calcium: 9.5 mg/dL (ref 8.4–10.5)
Chloride: 103 mmol/L (ref 96–112)
Creatinine, Ser: 0.79 mg/dL (ref 0.50–1.35)
GLUCOSE: 117 mg/dL — AB (ref 70–99)
POTASSIUM: 3.4 mmol/L — AB (ref 3.5–5.1)
Sodium: 137 mmol/L (ref 135–145)
Total Bilirubin: 0.6 mg/dL (ref 0.3–1.2)
Total Protein: 7.4 g/dL (ref 6.0–8.3)

## 2015-03-19 LAB — I-STAT TROPONIN, ED: Troponin i, poc: 0.01 ng/mL (ref 0.00–0.08)

## 2015-03-19 LAB — LIPASE, BLOOD: Lipase: 116 U/L — ABNORMAL HIGH (ref 11–59)

## 2015-03-19 MED ORDER — ONDANSETRON HCL 4 MG/2ML IJ SOLN
4.0000 mg | Freq: Once | INTRAMUSCULAR | Status: AC
  Administered 2015-03-19: 4 mg via INTRAVENOUS
  Filled 2015-03-19: qty 2

## 2015-03-19 MED ORDER — FENTANYL CITRATE (PF) 100 MCG/2ML IJ SOLN
50.0000 ug | Freq: Once | INTRAMUSCULAR | Status: AC
  Administered 2015-03-19: 50 ug via INTRAVENOUS
  Filled 2015-03-19: qty 2

## 2015-03-19 NOTE — ED Provider Notes (Signed)
CSN: [id number]     Arrival date & time 03/19/15  2106 History  This chart was scribed for Linton Flemings, MD by Eustaquio Maize, ED Scribe. This patient was seen in room A09C/A09C and the patient's care was started at 11:17 PM.    Chief Complaint  Patient presents with  . Abdominal Pain   The history is provided by the patient and the spouse. No language interpreter was used.     HPI Comments: [name] is a 54 y.o. male who presents to the Emergency Department complaining of severe, intermittent epigastric pain that began "months" ago, worsening Wednesday, 4/20 (3 days ago). Pt states that the pain is now diffuse across his abdomen. Pt is also unable to eat due to eating exacerbating the pain. Wife notes that pt went to urgent care several months ago and was sent to [facility] for possible cardiac disease. Pt had cardiac cath done which was normal and was diagnosed with GERD. He was told to follow up with PCP, Dr. Ronnald Ramp, who put patient on Omeprazole and Lipsin. Pt was referred to Gastroenterologist, Dr. Hilarie Fredrickson. Pt had ultrasound done on 4/13 (10 days ago) which was normal. He has colonoscopy and endoscopy scheduled for 05/06 (2 weeks from now). Wife reports that pt began having constant epigastric pain on Monday, 4/18 (5 days ago) which spread diffusely a couple of days later. Wife called Dr. Hilarie Fredrickson who told pt to add Carafate and increase Omeprazole to BID. Pt also began taking Tramadol every 6 hours without relief. Pt mentions that he had diarrhea a couple of days ago but has not had a bowel movement for the last 3 days. He also mentions nausea but denies vomiting. He denies fever, chills, or any other symptoms. Pt is nonsmoker. He denies EtOH or recreational drug use. Wife mentions that pt was recently diagnosed with diabetes which is currently diet controlled.     Past Medical History  Diagnosis Date  . Hyperlipidemia   . CAD (coronary artery disease)   . Obesity   . Anxiety   . Palpitation    . Intermediate coronary syndrome   . Kidney stone     "passed it"  . WKMQKMMN(817.7)     "monthly" (08/11/2014)  . Fatty liver   . GERD (gastroesophageal reflux disease)   . Depression   . DDD (degenerative disc disease), lumbar   . DM (diabetes mellitus)     diet controlled   Past Surgical History  Procedure Laterality Date  . Tonsillectomy  1979  . Posterior lumbar fusion  2010?  Marland Kitchen Cardiac catheterization  2007  . Left heart catheterization with coronary angiogram N/A 08/12/2014    Procedure: LEFT HEART CATHETERIZATION WITH CORONARY ANGIOGRAM;  Surgeon: Burnell Blanks, MD;  Location: [facility] CATH LAB;  Service: Cardiovascular;  Laterality: N/A;   Family History  Problem Relation Age of Onset  . Heart attack Father     father died of heart attach in his 11s  . Lung cancer Mother   . Hyperlipidemia Father   . Hyperlipidemia Mother   . Alzheimer's disease Maternal Grandmother    History  Substance Use Topics  . Smoking status: Former Smoker -- 2.00 packs/day for 35 years    Types: Cigarettes    Quit date: 07/27/2013  . Smokeless tobacco: Former Systems developer    Types: Snuff    Quit date: 07/27/2013  . Alcohol Use: 0.0 oz/week    0 Standard drinks or equivalent per week     Comment:  08/11/2014 "I drink a few times/year'    Review of Systems  Constitutional: Negative for fever and chills.  HENT: Negative for rhinorrhea.   Respiratory: Negative for cough.   Cardiovascular: Negative for chest pain.  Gastrointestinal: Positive for nausea, vomiting, abdominal pain and diarrhea.  Musculoskeletal: Negative for gait problem.  Neurological: Negative for speech difficulty.  Psychiatric/Behavioral: Negative for confusion.      Allergies  Review of patient's allergies indicates no known allergies.  Home Medications   Prior to Admission medications   Medication Sig Start Date End Date Taking? Authorizing Provider  ALPRAZolam (XANAX) 0.25 MG tablet Take 1 tablet (0.25 mg total)  by mouth at bedtime as needed for anxiety. 09/30/13  Yes Mary-Margaret Hassell Done, FNP  aspirin 325 MG tablet Take 325 mg by mouth daily.   Yes Historical Provider, MD  atorvastatin (LIPITOR) 80 MG tablet Take 1 tablet (80 mg total) by mouth daily. 08/12/14  Yes Almyra Deforest, PA  B Complex-C (SUPER B COMPLEX PO) Take 1 tablet by mouth daily.   Yes Historical Provider, MD  Cholecalciferol (VITAMIN D) 2000 UNITS tablet Take 2,000 Units by mouth daily.   Yes Historical Provider, MD  Choline Fenofibrate 135 MG capsule Take 1 capsule (135 mg total) by mouth daily. 09/30/13  Yes Mary-Margaret Hassell Done, FNP  Coenzyme Q10 (CO Q 10) 100 MG CAPS Take 1 tablet by mouth daily.   Yes Historical Provider, MD  escitalopram (LEXAPRO) 20 MG tablet Take 1 tablet (20 mg total) by mouth daily. 12/25/13  Yes Mary-Margaret Hassell Done, FNP  HYDROcodone-acetaminophen (NORCO/VICODIN) 5-325 MG per tablet Take 1 tablet by mouth every 6 (six) hours as needed for moderate pain.   Yes Historical Provider, MD  hyoscyamine (LEVBID) 0.375 MG 12 hr tablet Take 0.375 mg by mouth 2 (two) times daily.   Yes Historical Provider, MD  nebivolol (BYSTOLIC) 5 MG tablet Take 1 tablet (5 mg total) by mouth daily. 09/30/13  Yes Mary-Margaret Hassell Done, FNP  Omega-3 Fatty Acids (FISH OIL) 1000 MG CPDR Take 2,000 mg by mouth daily.    Yes Historical Provider, MD  omeprazole (PRILOSEC) 40 MG capsule Take 40 mg by mouth 2 (two) times daily.    Yes Historical Provider, MD  sucralfate (CARAFATE) 1 GM/10ML suspension Take 10 mLs (1 g total) by mouth 4 (four) times daily. 03/17/15  Yes Jerene Bears, MD  traMADol (ULTRAM) 50 MG tablet Take 1 tablet (50 mg total) by mouth every 6 (six) hours as needed. 03/17/15  Yes Jerene Bears, MD   Triage Vitals: BP 150/91 mmHg  Pulse 69  Temp(Src) 98.8 F (37.1 C)  Resp 16  SpO2 96%   Physical Exam  Constitutional: He is oriented to person, place, and time. He appears well-developed and well-nourished.  HENT:  Head: Normocephalic  and atraumatic.  Nose: Nose normal.  Mouth/Throat: Oropharynx is clear and moist.  Eyes: Conjunctivae and EOM are normal. Pupils are equal, round, and reactive to light.  Neck: Normal range of motion. Neck supple. No JVD present. No tracheal deviation present. No thyromegaly present.  Cardiovascular: Normal rate, regular rhythm, normal heart sounds and intact distal pulses.  Exam reveals no gallop and no friction rub.   No murmur heard. Pulmonary/Chest: Effort normal and breath sounds normal. No stridor. No respiratory distress. He has no wheezes. He has no rales. He exhibits no tenderness.  Abdominal: Soft. Bowel sounds are normal. He exhibits no distension and no mass. There is tenderness (diffuse tenderness worse in epigastrium). There is  no rebound and no guarding.  Musculoskeletal: Normal range of motion. He exhibits no edema or tenderness.  Lymphadenopathy:    He has no cervical adenopathy.  Neurological: He is alert and oriented to person, place, and time. He displays normal reflexes. He exhibits normal muscle tone. Coordination normal.  Skin: Skin is warm and dry. No rash noted. No erythema. No pallor.  Psychiatric: He has a normal mood and affect. His behavior is normal. Judgment and thought content normal.  Nursing note and vitals reviewed.   ED Course  Procedures (including critical care time)  DIAGNOSTIC STUDIES: Oxygen Saturation is 96% on RA, normal by my interpretation.    COORDINATION OF CARE: 11:27 PM-Discussed treatment plan which includes CT Abdomen Pelvis with pt at bedside and pt agreed to plan.   Labs Review Labs Reviewed  CBC WITH DIFFERENTIAL/PLATELET - Abnormal; Notable for the following:    WBC 14.4 (*)    Neutro Abs 11.0 (*)    Monocytes Absolute 1.2 (*)    All other components within normal limits  COMPREHENSIVE METABOLIC PANEL - Abnormal; Notable for the following:    Potassium 3.4 (*)    Glucose, Bld 117 (*)    All other components within normal  limits  LIPASE, BLOOD - Abnormal; Notable for the following:    Lipase 116 (*)    All other components within normal limits  URINALYSIS, ROUTINE W REFLEX MICROSCOPIC - Abnormal; Notable for the following:    Color, Urine AMBER (*)    APPearance CLOUDY (*)    Specific Gravity, Urine 1.035 (*)    Hgb urine dipstick LARGE (*)    Bilirubin Urine SMALL (*)    Protein, ur >300 (*)    All other components within normal limits  URINE MICROSCOPIC-ADD ON  I-STAT TROPOININ, ED    Imaging Review Ct Abdomen Pelvis W Contrast  03/20/2015   CLINICAL DATA:  Acute onset of mid abdominal pain. Initial encounter.  EXAM: CT ABDOMEN AND PELVIS WITH CONTRAST  TECHNIQUE: Multidetector CT imaging of the abdomen and pelvis was performed using the standard protocol following bolus administration of intravenous contrast.  CONTRAST:  129mL OMNIPAQUE IOHEXOL 300 MG/ML  SOLN  COMPARISON:  Abdominal ultrasound performed 03/09/2015, MRI of the lumbar spine performed 06/20/2012, and CT of the abdomen and pelvis from 03/31/2012  FINDINGS: Minimal bibasilar atelectasis is noted. Relatively diffuse coronary artery calcifications are seen.  There is marked wall thickening along the first and second segments of the duodenum, somewhat irregular in appearance, with overlying soft tissue inflammation and trace fluid. This tracks about the adjacent head of the pancreas. This may reflect an underlying mass, or possibly inflammation in association with a duodenal ulceration.  Scattered calcifications at the head of the pancreas likely reflect sequelae of chronic pancreatitis. The pancreatic duct is dilated to 1.0 cm, worsened from the prior study, with an enlarging 1.0 cm stone noted within the pancreatic duct at the body of the pancreas.  The liver and spleen are unremarkable in appearance. The gallbladder is within normal limits. The pancreas and adrenal glands are unremarkable.  A 1.3 cm cyst is noted at the interpole region of the left  kidney. The kidneys are otherwise unremarkable in appearance. There is no evidence of hydronephrosis. No renal or ureteral stones are seen. No perinephric stranding is appreciated.  No free fluid is identified. The small bowel is unremarkable in appearance. The stomach is within normal limits. No acute vascular abnormalities are seen.  The appendix is normal  in caliber and contains air. Multiple capsules are noted within the distal ileum, cecum and ascending colon, likely reflecting ingested pills. Minimal diverticulosis is noted along the proximal sigmoid colon, without evidence of diverticulitis.  The bladder is mildly distended; a tiny stone is noted at the right side of the base of the bladder, without evidence of obstruction. The prostate remains normal in size. No inguinal lymphadenopathy is seen.  No acute osseous abnormalities are identified. The patient is status post lumbar spinal fusion at L3-L5, with underlying decompression.  IMPRESSION: 1. Marked wall thickening along the first and second segments of the duodenum, somewhat irregular in appearance, with overlying soft tissue inflammation and trace fluid. This tracks about the adjacent head of the pancreas. This may reflect an underlying duodenal mass, or possibly inflammation in association with a large duodenal ulceration. Endoscopy could be considered for further evaluation, as deemed clinically appropriate. 2. Scattered calcifications at the head of the pancreas are worsened from the prior CT and likely reflect sequelae of chronic pancreatitis. Dilatation of the pancreatic duct to 1.0 cm, worsened from the prior study, both proximal and distal to an enlarging 1.0 cm stone in the pancreatic duct at the body of the pancreas. 3. Small left renal cyst noted. 4. Multiple capsules within the distal ileum, cecum and ascending colon, likely reflecting ingested pills. 5. Minimal diverticulosis along the proximal sigmoid colon, without evidence of  diverticulitis. 6. Tiny stone incidentally noted at the right side of the base of the bladder, without evidence of obstruction.   Electronically Signed   By: Garald Balding M.D.   On: 03/20/2015 02:44     EKG Interpretation None      MDM   Final diagnoses:  Acute pancreatitis, unspecified pancreatitis type    I personally performed the services described in this documentation, which was scribed in my presence. The recorded information has been reviewed and is accurate.  54 year old male with ongoing abdominal pain for some time, worse in the last week.  Recent ultrasound unremarkable.  Lipase today, elevated.  Patient has not been able to eat or drink much secondary to pain.  Plan for CT abdomen pelvis and admission for pancreatitis.     Linton Flemings, MD 03/20/15 [phone]

## 2015-03-19 NOTE — ED Notes (Signed)
Pt had abdomen ultrasound 03-09-15 for abdominal pain.  GI appt 04-01-15 for endoscopy and colonoscopy.   Since wed 03-16-15 abdominal pain worsened, has ate and drank very little.  Started Carafate 03-17-15, increased Omeprazole to BID and started Tramadol for pain.  No relief in pain.

## 2015-03-20 ENCOUNTER — Encounter (HOSPITAL_COMMUNITY)
Admission: EM | Disposition: A | Discharge status: Home / Self Care | Payer: Self-pay | Source: Home / Self Care | Attending: Internal Medicine

## 2015-03-20 ENCOUNTER — Encounter (HOSPITAL_COMMUNITY): Payer: Self-pay | Admitting: Radiology

## 2015-03-20 ENCOUNTER — Emergency Department (HOSPITAL_COMMUNITY): Payer: PRIVATE HEALTH INSURANCE

## 2015-03-20 DIAGNOSIS — R933 Abnormal findings on diagnostic imaging of other parts of digestive tract: Secondary | ICD-10-CM

## 2015-03-20 DIAGNOSIS — K859 Acute pancreatitis without necrosis or infection, unspecified: Secondary | ICD-10-CM | POA: Insufficient documentation

## 2015-03-20 DIAGNOSIS — I10 Essential (primary) hypertension: Secondary | ICD-10-CM | POA: Diagnosis present

## 2015-03-20 DIAGNOSIS — K858 Other acute pancreatitis: Secondary | ICD-10-CM

## 2015-03-20 DIAGNOSIS — R935 Abnormal findings on diagnostic imaging of other abdominal regions, including retroperitoneum: Secondary | ICD-10-CM | POA: Diagnosis not present

## 2015-03-20 DIAGNOSIS — I2510 Atherosclerotic heart disease of native coronary artery without angina pectoris: Secondary | ICD-10-CM | POA: Diagnosis present

## 2015-03-20 DIAGNOSIS — Z87891 Personal history of nicotine dependence: Secondary | ICD-10-CM | POA: Diagnosis not present

## 2015-03-20 DIAGNOSIS — Q438 Other specified congenital malformations of intestine: Secondary | ICD-10-CM | POA: Diagnosis not present

## 2015-03-20 DIAGNOSIS — K760 Fatty (change of) liver, not elsewhere classified: Secondary | ICD-10-CM | POA: Diagnosis present

## 2015-03-20 DIAGNOSIS — E785 Hyperlipidemia, unspecified: Secondary | ICD-10-CM | POA: Diagnosis present

## 2015-03-20 DIAGNOSIS — K219 Gastro-esophageal reflux disease without esophagitis: Secondary | ICD-10-CM | POA: Diagnosis present

## 2015-03-20 DIAGNOSIS — K868 Other specified diseases of pancreas: Secondary | ICD-10-CM | POA: Diagnosis not present

## 2015-03-20 DIAGNOSIS — Z7982 Long term (current) use of aspirin: Secondary | ICD-10-CM | POA: Diagnosis not present

## 2015-03-20 DIAGNOSIS — Z87442 Personal history of urinary calculi: Secondary | ICD-10-CM | POA: Diagnosis not present

## 2015-03-20 DIAGNOSIS — R1013 Epigastric pain: Secondary | ICD-10-CM | POA: Diagnosis not present

## 2015-03-20 DIAGNOSIS — E119 Type 2 diabetes mellitus without complications: Secondary | ICD-10-CM | POA: Diagnosis present

## 2015-03-20 DIAGNOSIS — F418 Other specified anxiety disorders: Secondary | ICD-10-CM | POA: Diagnosis present

## 2015-03-20 DIAGNOSIS — R109 Unspecified abdominal pain: Secondary | ICD-10-CM | POA: Diagnosis present

## 2015-03-20 DIAGNOSIS — K850 Idiopathic acute pancreatitis: Secondary | ICD-10-CM | POA: Diagnosis not present

## 2015-03-20 HISTORY — PX: ENTEROSCOPY: SHX5533

## 2015-03-20 LAB — URINALYSIS, ROUTINE W REFLEX MICROSCOPIC
Glucose, UA: NEGATIVE mg/dL
KETONES UR: NEGATIVE mg/dL
Leukocytes, UA: NEGATIVE
NITRITE: NEGATIVE
PH: 5.5 (ref 5.0–8.0)
Protein, ur: >300 mg/dL — AB
Specific Gravity, Urine: 1.035 — ABNORMAL HIGH (ref 1.005–1.030)
Urobilinogen, UA: 0.2 mg/dL (ref 0.0–1.0)

## 2015-03-20 LAB — GLUCOSE, CAPILLARY
GLUCOSE-CAPILLARY: 123 mg/dL — AB (ref 70–99)
Glucose-Capillary: 115 mg/dL — ABNORMAL HIGH (ref 70–99)

## 2015-03-20 LAB — URINE MICROSCOPIC-ADD ON

## 2015-03-20 SURGERY — ENTEROSCOPY
Anesthesia: Moderate Sedation

## 2015-03-20 MED ORDER — FENTANYL CITRATE (PF) 100 MCG/2ML IJ SOLN
INTRAMUSCULAR | Status: AC
  Filled 2015-03-20: qty 2

## 2015-03-20 MED ORDER — MIDAZOLAM HCL 5 MG/ML IJ SOLN
INTRAMUSCULAR | Status: AC
Start: 2015-03-20 — End: 2015-03-20
  Filled 2015-03-20: qty 2

## 2015-03-20 MED ORDER — HYDROMORPHONE HCL 1 MG/ML IJ SOLN
0.5000 mg | Freq: Once | INTRAMUSCULAR | Status: AC
  Administered 2015-03-20: 0.5 mg via INTRAVENOUS
  Filled 2015-03-20: qty 1

## 2015-03-20 MED ORDER — ESCITALOPRAM OXALATE 20 MG PO TABS
20.0000 mg | ORAL_TABLET | Freq: Every day | ORAL | Status: DC
  Administered 2015-03-20 – 2015-03-23 (×4): 20 mg via ORAL
  Filled 2015-03-20 (×4): qty 1

## 2015-03-20 MED ORDER — BUTAMBEN-TETRACAINE-BENZOCAINE 2-2-14 % EX AERO
INHALATION_SPRAY | CUTANEOUS | Status: DC | PRN
Start: 2015-03-20 — End: 2015-03-20
  Administered 2015-03-20: 2 via TOPICAL

## 2015-03-20 MED ORDER — SUCRALFATE 1 GM/10ML PO SUSP
1.0000 g | Freq: Four times a day (QID) | ORAL | Status: DC
  Filled 2015-03-20 (×4): qty 10

## 2015-03-20 MED ORDER — DIPHENHYDRAMINE HCL 50 MG/ML IJ SOLN
INTRAMUSCULAR | Status: AC
  Filled 2015-03-20: qty 1

## 2015-03-20 MED ORDER — IOHEXOL 300 MG/ML  SOLN
100.0000 mL | Freq: Once | INTRAMUSCULAR | Status: AC | PRN
Start: 2015-03-20 — End: 2015-03-20
  Administered 2015-03-20: 100 mL via INTRAVENOUS

## 2015-03-20 MED ORDER — ATORVASTATIN CALCIUM 80 MG PO TABS
80.0000 mg | ORAL_TABLET | Freq: Every day | ORAL | Status: DC
  Administered 2015-03-20 – 2015-03-23 (×4): 80 mg via ORAL
  Filled 2015-03-20 (×4): qty 1

## 2015-03-20 MED ORDER — ALPRAZOLAM 0.25 MG PO TABS: 0.2500 mg | ORAL_TABLET | Freq: Every evening | ORAL | Status: DC | PRN

## 2015-03-20 MED ORDER — PANTOPRAZOLE SODIUM 40 MG PO TBEC
80.0000 mg | DELAYED_RELEASE_TABLET | Freq: Two times a day (BID) | ORAL | Status: DC
  Filled 2015-03-20: qty 2

## 2015-03-20 MED ORDER — HYDROMORPHONE HCL 1 MG/ML IJ SOLN
1.0000 mg | Freq: Once | INTRAMUSCULAR | Status: AC
  Administered 2015-03-20: 1 mg via INTRAVENOUS
  Filled 2015-03-20: qty 1

## 2015-03-20 MED ORDER — NEBIVOLOL HCL 5 MG PO TABS
5.0000 mg | ORAL_TABLET | Freq: Every day | ORAL | Status: DC
Start: 2015-03-20 — End: 2015-03-23
  Administered 2015-03-21 – 2015-03-23 (×3): 5 mg via ORAL
  Filled 2015-03-20 (×4): qty 1

## 2015-03-20 MED ORDER — IOHEXOL 300 MG/ML  SOLN
25.0000 mL | Freq: Once | INTRAMUSCULAR | Status: AC | PRN
  Administered 2015-03-20: 25 mL via ORAL

## 2015-03-20 MED ORDER — HYDROMORPHONE HCL 1 MG/ML IJ SOLN
1.0000 mg | INTRAMUSCULAR | Status: DC | PRN
  Administered 2015-03-20 – 2015-03-22 (×12): 1 mg via INTRAVENOUS
  Filled 2015-03-20 (×12): qty 1

## 2015-03-20 MED ORDER — ASPIRIN 325 MG PO TABS
325.0000 mg | ORAL_TABLET | Freq: Every day | ORAL | Status: DC
  Administered 2015-03-20 – 2015-03-23 (×4): 325 mg via ORAL
  Filled 2015-03-20 (×4): qty 1

## 2015-03-20 MED ORDER — MIDAZOLAM HCL 10 MG/2ML IJ SOLN
INTRAMUSCULAR | Status: DC | PRN
  Administered 2015-03-20: 2 mg via INTRAVENOUS
  Administered 2015-03-20: 1 mg via INTRAVENOUS
  Administered 2015-03-20: 2 mg via INTRAVENOUS

## 2015-03-20 MED ORDER — SODIUM CHLORIDE 0.9 % IV SOLN
INTRAVENOUS | Status: DC
  Administered 2015-03-20: 150 mL/h via INTRAVENOUS
  Administered 2015-03-20: 14:00:00 via INTRAVENOUS
  Administered 2015-03-20: 125 mL/h via INTRAVENOUS
  Administered 2015-03-21: 10:00:00 via INTRAVENOUS
  Administered 2015-03-21: 150 mL/h via INTRAVENOUS
  Administered 2015-03-22 – 2015-03-23 (×2): via INTRAVENOUS

## 2015-03-20 MED ORDER — PANTOPRAZOLE SODIUM 40 MG PO TBEC
40.0000 mg | DELAYED_RELEASE_TABLET | Freq: Every day | ORAL | Status: DC
  Administered 2015-03-21 – 2015-03-23 (×3): 40 mg via ORAL
  Filled 2015-03-20 (×3): qty 1

## 2015-03-20 MED ORDER — SODIUM CHLORIDE 0.9 % IV SOLN
INTRAVENOUS | Status: DC
  Administered 2015-03-22: 21:00:00 via INTRAVENOUS

## 2015-03-20 MED ORDER — PANTOPRAZOLE SODIUM 40 MG PO TBEC: 80.0000 mg | DELAYED_RELEASE_TABLET | Freq: Every day | ORAL | Status: DC

## 2015-03-20 MED ORDER — FENTANYL CITRATE (PF) 100 MCG/2ML IJ SOLN
INTRAMUSCULAR | Status: DC | PRN
  Administered 2015-03-20 (×2): 25 ug via INTRAVENOUS

## 2015-03-20 NOTE — H&P (Signed)
[facility] History and Physical  [name] BMW:[id number] DOB: [birth year] DOA: 03/19/2015  Referring physician: EDP PCP: Terald Sleeper, PA-C   Chief Complaint: Abdominal pain   HPI: [name] is a 55 y.o. male who presents to the ED with epigastric pain, intermittent, onset "months" ago.  Got worse 3 days ago.  Is severe, 10/10 after eating.  Unable to eat due to pain.  Patient ultimately had cardiac cath which was normal.  Had US done with GI which was normal 10 days ago.  Has EGD scheduled for first week of May.  On Carafate and omeprazole BID.  Review of Systems: Systems reviewed.  As above, otherwise negative  Past Medical History  Diagnosis Date  . Hyperlipidemia   . CAD (coronary artery disease)   . Obesity   . Anxiety   . Palpitation   . Intermediate coronary syndrome   . Kidney stone     "passed it"  . UVOZDGUY(403.4)     "monthly" (08/11/2014)  . Fatty liver   . GERD (gastroesophageal reflux disease)   . Depression   . DDD (degenerative disc disease), lumbar   . DM (diabetes mellitus)     diet controlled   Past Surgical History  Procedure Laterality Date  . Tonsillectomy  1979  . Posterior lumbar fusion  2010?  Marland Kitchen Cardiac catheterization  2007  . Left heart catheterization with coronary angiogram N/A 08/12/2014    Procedure: LEFT HEART CATHETERIZATION WITH CORONARY ANGIOGRAM;  Surgeon: Burnell Blanks, MD;  Location: [facility] CATH LAB;  Service: Cardiovascular;  Laterality: N/A;   Social History:  reports that he quit smoking about 19 months ago. His smoking use included Cigarettes. He has a 70 pack-year smoking history. He quit smokeless tobacco use about 19 months ago. His smokeless tobacco use included Snuff. He reports that he drinks alcohol. He reports that he does not use illicit drugs.  No Known Allergies  Family History  Problem Relation Age of Onset  . Heart attack Father     father died of heart attach in his 55s  . Lung cancer Mother    . Hyperlipidemia Father   . Hyperlipidemia Mother   . Alzheimer's disease Maternal Grandmother      Prior to Admission medications   Medication Sig Start Date End Date Taking? Authorizing Provider  ALPRAZolam (XANAX) 0.25 MG tablet Take 1 tablet (0.25 mg total) by mouth at bedtime as needed for anxiety. 09/30/13  Yes Mary-Margaret Hassell Done, FNP  aspirin 325 MG tablet Take 325 mg by mouth daily.   Yes Historical Provider, MD  atorvastatin (LIPITOR) 80 MG tablet Take 1 tablet (80 mg total) by mouth daily. 08/12/14  Yes Almyra Deforest, PA  B Complex-C (SUPER B COMPLEX PO) Take 1 tablet by mouth daily.   Yes Historical Provider, MD  Cholecalciferol (VITAMIN D) 2000 UNITS tablet Take 2,000 Units by mouth daily.   Yes Historical Provider, MD  Choline Fenofibrate 135 MG capsule Take 1 capsule (135 mg total) by mouth daily. 09/30/13  Yes Mary-Margaret Hassell Done, FNP  Coenzyme Q10 (CO Q 10) 100 MG CAPS Take 1 tablet by mouth daily.   Yes Historical Provider, MD  escitalopram (LEXAPRO) 20 MG tablet Take 1 tablet (20 mg total) by mouth daily. 12/25/13  Yes Mary-Margaret Hassell Done, FNP  HYDROcodone-acetaminophen (NORCO/VICODIN) 5-325 MG per tablet Take 1 tablet by mouth every 6 (six) hours as needed for moderate pain.   Yes Historical Provider, MD  hyoscyamine (LEVBID) 0.375 MG 12 hr  tablet Take 0.375 mg by mouth 2 (two) times daily.   Yes Historical Provider, MD  nebivolol (BYSTOLIC) 5 MG tablet Take 1 tablet (5 mg total) by mouth daily. 09/30/13  Yes Mary-Margaret Hassell Done, FNP  Omega-3 Fatty Acids (FISH OIL) 1000 MG CPDR Take 2,000 mg by mouth daily.    Yes Historical Provider, MD  omeprazole (PRILOSEC) 40 MG capsule Take 40 mg by mouth 2 (two) times daily.    Yes Historical Provider, MD  sucralfate (CARAFATE) 1 GM/10ML suspension Take 10 mLs (1 g total) by mouth 4 (four) times daily. 03/17/15  Yes Jerene Bears, MD  traMADol (ULTRAM) 50 MG tablet Take 1 tablet (50 mg total) by mouth every 6 (six) hours as needed. 03/17/15   Yes Jerene Bears, MD   Physical Exam: Filed Vitals:   03/20/15 0315  BP: 134/80  Pulse: 65  Temp:   Resp: 16    BP 134/80 mmHg  Pulse 65  Temp(Src) 98.8 F (37.1 C)  Resp 16  SpO2 92%  General Appearance:    Alert, oriented, no distress, appears stated age  Head:    Normocephalic, atraumatic  Eyes:    PERRL, EOMI, sclera non-icteric        Nose:   Nares without drainage or epistaxis. Mucosa, turbinates normal  Throat:   Moist mucous membranes. Oropharynx without erythema or exudate.  Neck:   Supple. No carotid bruits.  No thyromegaly.  No lymphadenopathy.   Back:     No CVA tenderness, no spinal tenderness  Lungs:     Clear to auscultation bilaterally, without wheezes, rhonchi or rales  Chest wall:    No tenderness to palpitation  Heart:    Regular rate and rhythm without murmurs, gallops, rubs  Abdomen:     Soft, non-tender, nondistended, normal bowel sounds, no organomegaly  Genitalia:    deferred  Rectal:    deferred  Extremities:   No clubbing, cyanosis or edema.  Pulses:   2+ and symmetric all extremities  Skin:   Skin color, texture, turgor normal, no rashes or lesions  Lymph nodes:   Cervical, supraclavicular, and axillary nodes normal  Neurologic:   CNII-XII intact. Normal strength, sensation and reflexes      throughout    Labs on Admission:  Basic Metabolic Panel:  Recent Labs Lab 03/19/15 2133  NA 137  K 3.4*  CL 103  CO2 23  GLUCOSE 117*  BUN 16  CREATININE 0.79  CALCIUM 9.5   Liver Function Tests:  Recent Labs Lab 03/19/15 2133  AST 19  ALT 20  ALKPHOS 53  BILITOT 0.6  PROT 7.4  ALBUMIN 3.9    Recent Labs Lab 03/19/15 2133  LIPASE 116*   No results for input(s): AMMONIA in the last 168 hours. CBC:  Recent Labs Lab 03/19/15 2133  WBC 14.4*  NEUTROABS 11.0*  HGB 14.3  HCT 42.7  MCV 86.8  PLT 241   Cardiac Enzymes: No results for input(s): CKTOTAL, CKMB, CKMBINDEX, TROPONINI in the last 168 hours.  BNP (last 3  results) No results for input(s): PROBNP in the last 8760 hours. CBG: No results for input(s): GLUCAP in the last 168 hours.  Radiological Exams on Admission: Ct Abdomen Pelvis W Contrast  03/20/2015   CLINICAL DATA:  Acute onset of mid abdominal pain. Initial encounter.  EXAM: CT ABDOMEN AND PELVIS WITH CONTRAST  TECHNIQUE: Multidetector CT imaging of the abdomen and pelvis was performed using the standard protocol following bolus administration of  intravenous contrast.  CONTRAST:  133mL OMNIPAQUE IOHEXOL 300 MG/ML  SOLN  COMPARISON:  Abdominal ultrasound performed 03/09/2015, MRI of the lumbar spine performed 06/20/2012, and CT of the abdomen and pelvis from 03/31/2012  FINDINGS: Minimal bibasilar atelectasis is noted. Relatively diffuse coronary artery calcifications are seen.  There is marked wall thickening along the first and second segments of the duodenum, somewhat irregular in appearance, with overlying soft tissue inflammation and trace fluid. This tracks about the adjacent head of the pancreas. This may reflect an underlying mass, or possibly inflammation in association with a duodenal ulceration.  Scattered calcifications at the head of the pancreas likely reflect sequelae of chronic pancreatitis. The pancreatic duct is dilated to 1.0 cm, worsened from the prior study, with an enlarging 1.0 cm stone noted within the pancreatic duct at the body of the pancreas.  The liver and spleen are unremarkable in appearance. The gallbladder is within normal limits. The pancreas and adrenal glands are unremarkable.  A 1.3 cm cyst is noted at the interpole region of the left kidney. The kidneys are otherwise unremarkable in appearance. There is no evidence of hydronephrosis. No renal or ureteral stones are seen. No perinephric stranding is appreciated.  No free fluid is identified. The small bowel is unremarkable in appearance. The stomach is within normal limits. No acute vascular abnormalities are seen.  The  appendix is normal in caliber and contains air. Multiple capsules are noted within the distal ileum, cecum and ascending colon, likely reflecting ingested pills. Minimal diverticulosis is noted along the proximal sigmoid colon, without evidence of diverticulitis.  The bladder is mildly distended; a tiny stone is noted at the right side of the base of the bladder, without evidence of obstruction. The prostate remains normal in size. No inguinal lymphadenopathy is seen.  No acute osseous abnormalities are identified. The patient is status post lumbar spinal fusion at L3-L5, with underlying decompression.  IMPRESSION: 1. Marked wall thickening along the first and second segments of the duodenum, somewhat irregular in appearance, with overlying soft tissue inflammation and trace fluid. This tracks about the adjacent head of the pancreas. This may reflect an underlying duodenal mass, or possibly inflammation in association with a large duodenal ulceration. Endoscopy could be considered for further evaluation, as deemed clinically appropriate. 2. Scattered calcifications at the head of the pancreas are worsened from the prior CT and likely reflect sequelae of chronic pancreatitis. Dilatation of the pancreatic duct to 1.0 cm, worsened from the prior study, both proximal and distal to an enlarging 1.0 cm stone in the pancreatic duct at the body of the pancreas. 3. Small left renal cyst noted. 4. Multiple capsules within the distal ileum, cecum and ascending colon, likely reflecting ingested pills. 5. Minimal diverticulosis along the proximal sigmoid colon, without evidence of diverticulitis. 6. Tiny stone incidentally noted at the right side of the base of the bladder, without evidence of obstruction.   Electronically Signed   By: Garald Balding M.D.   On: 03/20/2015 02:44    EKG: Independently reviewed.  Assessment/Plan Principal Problem:   Duodenal anomaly Active Problems:   Pancreatitis   1. Duodenal  abnormality on imaging, either a very large ulcer or a mass - also has mild pancreatitis with lipase of just over 100 1. Calling GI for EGD 2. NPO 3. IVF to prevent dehydration 4. Continue PPI and carafate for now 5. Pain control  Spoke with Dr. Hilarie Fredrickson who is the patients gastroenterologist and conveniently enough is on call tonight  for [employer].  He will see patient in hospital.  Code Status: Full Code  Family Communication: Family at bedside Disposition Plan: Admit to inpatient   Time spent: 70 min  Shene Maxfield M. [facility] Pager [phone]  If 7AM-7PM, please contact the day team taking care of the patient [email or web address] Password [facility] 03/20/2015, 3:58 AM

## 2015-03-20 NOTE — Progress Notes (Signed)
Report called and given by ED RN Primitivo Gauze.

## 2015-03-20 NOTE — Progress Notes (Signed)
Patient seen and examined. Admitted after midnight secondary to abdominal pain and nausea. Patient reports that the pain is worse when eating; and workup has demonstrated findings consistently with mild pancreatitis and potential ulcer vs mass in the duodenum. For further admission information/details please refer to Dr. Alcario Drought H&P  Plan -Patient is status post endoscopy which demonstrated: ENDOSCOPIC IMPRESSION: 1. The mucosa of the esophagus appeared normal 2. The mucosa of the stomach appeared normal 3. Mild fullness, most likely from head of pancreas inflammation in the early 2nd part of the duodenum. Normal duodenal mucosa throughout and no evidence of duodenum obstruction.  RECOMMENDATIONS: 1. Will continue Supportive care for pancreatitis 2. Patient might require EUS for further evaluation of his pancreas 3. Continue PPI as he will be nothing by mouth; for GI prophylaxis   Barton Dubois [phone]

## 2015-03-20 NOTE — ED Notes (Signed)
Pt states that he finished drinking his contrast and it made his abdominal pain worse

## 2015-03-20 NOTE — Progress Notes (Signed)
Attempted to get report from ED RN but not yet ready as of this time. Stated to call back.

## 2015-03-20 NOTE — Progress Notes (Signed)
New Admission Note:   Arrival Method: per stretcher from ED with NT M. Hollis and wife Jan Mental Orientation: alert, oriented X4 Telemetry: none ordered Assessment: Completed Skin: warm, dry, intact, heels are dry and somewhat flaky, no open wounds noted IV: G20 on the right AC with transparent dressing, clean, dry and intact Pain: 10/10 on the epigastric region, last pain medicine received was at 0303 at the ED. MD notified Tubes: none Safety Measures: Safety Fall Prevention Plan has been given, discussed and signed Admission: Completed [address] [place] Orientation: Patient has been orientated to the room, unit and staff.  Family: wife Jan at the bedside  Orders have been reviewed and implemented. Will continue to monitor the patient. Call light has been placed within reach and bed alarm has been activated.   Georgeanna Harrison BSN, RN [place] [address] [place]

## 2015-03-20 NOTE — Op Note (Signed)
[facility] [place], [id number]   ENDOSCOPY PROCEDURE REPORT  PATIENT: [name], [name]  MR#: [id number] BIRTHDATE: [birth year] , 34  yrs. old GENDER: male ENDOSCOPIST: Jerene Bears, MD REFERRED BY:  [facility] PROCEDURE DATE:  03/20/2015 PROCEDURE:  EGD, diagnostic ASA CLASS:     Class II INDICATIONS:  epigastric pain and abnormal CT of the GI tract. MEDICATIONS: Fentanyl 50 mcg IV and Versed 5 mg IV TOPICAL ANESTHETIC: Cetacaine Spray  DESCRIPTION OF PROCEDURE: After the risks benefits and alternatives of the procedure were thoroughly explained, informed consent was obtained.  The Pentax Ultraslim colonoscope was introduced through the mouth and advanced to the third portion of the duodenum , Without limitations.  The instrument was slowly withdrawn as the mucosa was fully examined.   ESOPHAGUS: The mucosa of the esophagus appeared normal.   Z-line regular  STOMACH: Thick mucoid material in the stomach which cleared with irrigation and lavage (possibly sucralfate). The mucosa of the stomach appeared normal.  DUODENUM: The duodenal mucosa showed no abnormalities in the entire duodenum (bulb, 2nd, 3rd, and 4th portions).  No evidence of obstruction or stenosis.  There was fullness in the early 2nd part of the duodenum without mucosal abnormality or significant narrowing.  Retroflexed views revealed no abnormalities.     The scope was then withdrawn from the patient and the procedure completed.  COMPLICATIONS: There were no immediate complications.    ENDOSCOPIC IMPRESSION: 1.   The mucosa of the esophagus appeared normal 2.   The mucosa of the stomach appeared normal 3.   Mild fullness, most likely from head of pancreas inflammation in the early 2nd part of the duodenum.  Normal duodenal mucosa throughout and no evidence of duodenum obstruction.  RECOMMENDATIONS: 1.  Supportive care for pancreatitis 2.  Will discuss  further evaluation including possible EUS  eSigned:  Jerene Bears, MD 03/20/2015 12:10 PM    CC: the patient  PATIENT NAME:  [name], [name] MR#: [id number]

## 2015-03-20 NOTE — Consult Note (Signed)
[facility] Consult: 8:19 AM 03/20/2015  LOS: 0 days    Referring Provider: Dr Dyann Kief  Primary Care Physician:  Terald Sleeper, PA-C Primary Gastroenterologist:  Dr. Hilarie Fredrickson     Reason for Consultation:  Epigastric pain.    HPI: [name] is a 54 y.o. male.  Hx CAD, HLD, obesity, DM, GERD, deg spine disc disease.    Several months of intermittent epigastric pain +/- nausea but not emesis which intensified in the last 3 days PTA despite Omeprazole BID and Carafate.  Pain historically not related to eating but in last few days eating has intensified the pain, can last hours to a few days.  In recent days pain unrelenting despite Tramadol,  Added by Dr Hilarie Fredrickson on 4/6.  Normal GB Ultrasound 4/13.  EGD set for early 03/2015.  Admitted from ED yeasterday.  CT abdomen: thickening in D1 and D2 with irregular appearance and overlying soft tissue inflammation with trace fluid.  Progressive head of pancreas calcifications.  PD 1cm with enlarging 1cm stone in PD.  Capsules in distal ileum, cecum and ascending colon may be ingested pills, sigmoid tics.  Lipase 116, up from 48 on 03/02/15.  LFTs normal.  WBCs 14.4.   + intentional 30# weight loss.  No recall of previous pancreatitis.  Several years ago his Triglycerides were in the thousands per wife's recall.     Past Medical History  Diagnosis Date  . Hyperlipidemia   . CAD (coronary artery disease)   . Obesity   . Anxiety   . Palpitation   . Intermediate coronary syndrome   . Kidney stone     "passed it"  . HCWCBJSE(831.5)     "monthly" (08/11/2014)  . Fatty liver   . GERD (gastroesophageal reflux disease)   . Depression   . DDD (degenerative disc disease), lumbar   . DM (diabetes mellitus)     diet controlled    Past Surgical History  Procedure  Laterality Date  . Tonsillectomy  1979  . Posterior lumbar fusion  2010?  Marland Kitchen Cardiac catheterization  2007  . Left heart catheterization with coronary angiogram N/A 08/12/2014    Procedure: LEFT HEART CATHETERIZATION WITH CORONARY ANGIOGRAM;  Surgeon: Burnell Blanks, MD;  Location: [facility] CATH LAB;  Service: Cardiovascular;  Laterality: N/A;    Prior to Admission medications   Medication Sig Start Date End Date Taking? Authorizing Provider  ALPRAZolam (XANAX) 0.25 MG tablet Take 1 tablet (0.25 mg total) by mouth at bedtime as needed for anxiety. 09/30/13  Yes Mary-Margaret Hassell Done, FNP  aspirin 325 MG tablet Take 325 mg by mouth daily.   Yes Historical Provider, MD  atorvastatin (LIPITOR) 80 MG tablet Take 1 tablet (80 mg total) by mouth daily. 08/12/14  Yes Almyra Deforest, PA  B Complex-C (SUPER B COMPLEX PO) Take 1 tablet by mouth daily.   Yes Historical Provider, MD  Cholecalciferol (VITAMIN D) 2000 UNITS tablet Take 2,000 Units by mouth daily.   Yes Historical Provider, MD  Choline Fenofibrate 135 MG capsule Take 1 capsule (135 mg  total) by mouth daily. 09/30/13  Yes Mary-Margaret Hassell Done, FNP  Coenzyme Q10 (CO Q 10) 100 MG CAPS Take 1 tablet by mouth daily.   Yes Historical Provider, MD  escitalopram (LEXAPRO) 20 MG tablet Take 1 tablet (20 mg total) by mouth daily. 12/25/13  Yes Mary-Margaret Hassell Done, FNP  HYDROcodone-acetaminophen (NORCO/VICODIN) 5-325 MG per tablet Take 1 tablet by mouth every 6 (six) hours as needed for moderate pain.   Yes Historical Provider, MD  hyoscyamine (LEVBID) 0.375 MG 12 hr tablet Take 0.375 mg by mouth 2 (two) times daily.   Yes Historical Provider, MD  nebivolol (BYSTOLIC) 5 MG tablet Take 1 tablet (5 mg total) by mouth daily. 09/30/13  Yes Mary-Margaret Hassell Done, FNP  Omega-3 Fatty Acids (FISH OIL) 1000 MG CPDR Take 2,000 mg by mouth daily.    Yes Historical Provider, MD  omeprazole (PRILOSEC) 40 MG capsule Take 40 mg by mouth 2 (two) times daily.    Yes Historical  Provider, MD  sucralfate (CARAFATE) 1 GM/10ML suspension Take 10 mLs (1 g total) by mouth 4 (four) times daily. 03/17/15  Yes Jerene Bears, MD  traMADol (ULTRAM) 50 MG tablet Take 1 tablet (50 mg total) by mouth every 6 (six) hours as needed. 03/17/15  Yes Jerene Bears, MD    Scheduled Meds: . aspirin  325 mg Oral Daily  . atorvastatin  80 mg Oral Daily  . escitalopram  20 mg Oral Daily  . nebivolol  5 mg Oral Daily  . pantoprazole  80 mg Oral BID  . sucralfate  1 g Oral QID   Infusions: . sodium chloride 125 mL/hr (03/20/15 0436)   PRN Meds: ALPRAZolam, HYDROmorphone (DILAUDID) injection   Allergies as of 03/19/2015  . (No Known Allergies)    Family History  Problem Relation Age of Onset  . Heart attack Father     father died of heart attach in his 43s  . Lung cancer Mother   . Hyperlipidemia Father   . Hyperlipidemia Mother   . Alzheimer's disease Maternal Grandmother     History   Social History  . Marital Status: Married    Spouse Name: N/A  . Number of Children: 2  . Years of Education: N/A   Occupational History  . Not on file.   Social History Main Topics  . Smoking status: Former Smoker -- 2.00 packs/day for 35 years    Types: Cigarettes    Quit date: 07/27/2013  . Smokeless tobacco: Former Systems developer    Types: Snuff    Quit date: 07/27/2013  . Alcohol Use: 0.0 oz/week    0 Standard drinks or equivalent per week     Comment: 08/11/2014 "I drink a few times/year'  . Drug Use: No  . Sexual Activity: No   Other Topics Concern  . Not on file   Social History Narrative   ** Merged History Encounter **        REVIEW OF SYSTEMS: Constitutional:  Weight loss as above. No generalized weakness. ENT:  No nose bleeds Pulm:  No shortness of breath, cough or dyspnea CV:  No palpitations, no LE edema. No chest pain GU:  No hematuria, no frequency.  No amber colored urine. GI:  Per HPI.  Less frequent bowel movements in the past several days. Last bowel movement  was 3 days ago. Heme:  No issues with excessive bleeding or vigorous bruising.   Transfusions:  None in past Neuro:  No headaches, no peripheral tingling or numbness Derm:  No itching, no rash or sores.  Endocrine:  No sweats or chills.  No polyuria or dysuria Immunization:  Not queried Travel:  None beyond local counties in last few months.    PHYSICAL EXAM: Vital signs in last 24 hours: Filed Vitals:   03/20/15 0432  BP: 137/75  Pulse: 62  Temp: 99.2 F (37.3 C)  Resp: 17   Wt Readings from Last 3 Encounters:  03/20/15 216 lb 0.8 oz (98 kg)  03/02/15 228 lb 2 oz (103.477 kg)  08/12/14 257 lb 11.5 oz (116.9 kg)   General: Pleasant, well-appearing WM. Currently comfortable. Head:  No facial asymmetry or swelling. No signs of head trauma.  Eyes:  No scleral icterus or conjunctival pallor Ears:  No hearing deficit  Nose:  No congestion or discharge Mouth:  Moist, clear oral mucosa. Dentition in good repair. Neck:  No JVD, no TMG, no masses. Lungs:  CTA bil Heart: RRR. No MRG. S1/S2 audible. Abdomen:  Soft, bowel sounds hypoactive. Slight tenderness in the epigastrium and upper abdomen bilaterally. No guarding or rebound. No masses, no HSM, no bruits..   Rectal: Deferred   Musc/Skeltl: No joint erythema, swelling or contractures. Extremities:  No CCE.  Neurologic:  Alert, oriented 3. No limb weakness. No tremors. Skin:  No telangiectasia, no sores, no rashes. Tattoos:  National grade tattoos on both upper arms. Nodes:  No cervical adenopathy.   Psych:  Doesn't, relaxed, not agitated or depressed.  Intake/Output from previous day: 04/23 0701 - 04/24 0700 In: 175 [I.V.:175] Out: 400 [Urine:400] Intake/Output this shift:    LAB RESULTS:  Recent Labs  03/19/15 2133  WBC 14.4*  HGB 14.3  HCT 42.7  PLT 241   BMET Lab Results  Component Value Date   NA 137 03/19/2015   NA 137 03/02/2015   NA 137 08/11/2014   K 3.4* 03/19/2015   K 4.1 03/02/2015   K 4.0  08/11/2014   CL 103 03/19/2015   CL 105 03/02/2015   CL 99 08/11/2014   CO2 23 03/19/2015   CO2 28 03/02/2015   CO2 27 08/11/2014   GLUCOSE 117* 03/19/2015   GLUCOSE 107* 03/02/2015   GLUCOSE 124* 08/11/2014   BUN 16 03/19/2015   BUN 22 03/02/2015   BUN 18 08/11/2014   CREATININE 0.79 03/19/2015   CREATININE 0.96 03/02/2015   CREATININE 0.84 08/11/2014   CALCIUM 9.5 03/19/2015   CALCIUM 9.5 03/02/2015   CALCIUM 9.6 08/11/2014   LFT  Recent Labs  03/19/15 2133  PROT 7.4  ALBUMIN 3.9  AST 19  ALT 20  ALKPHOS 53  BILITOT 0.6   PT/INR Lab Results  Component Value Date   INR 1.07 08/11/2014   INR 0.9 06/13/2008   Lipase     Component Value Date/Time   LIPASE 116* 03/19/2015 2133    Drugs of Abuse  No results found for: LABOPIA, COCAINSCRNUR, LABBENZ, AMPHETMU, THCU, LABBARB   RADIOLOGY STUDIES: Ct Abdomen Pelvis W Contrast  03/20/2015   CLINICAL DATA:  Acute onset of mid abdominal pain. Initial encounter.  EXAM: CT ABDOMEN AND PELVIS WITH CONTRAST  TECHNIQUE: Multidetector CT imaging of the abdomen and pelvis was performed using the standard protocol following bolus administration of intravenous contrast.  CONTRAST:  182mL OMNIPAQUE IOHEXOL 300 MG/ML  SOLN  COMPARISON:  Abdominal ultrasound performed 03/09/2015, MRI of the lumbar spine performed 06/20/2012, and CT of the abdomen and pelvis from 03/31/2012  FINDINGS: Minimal bibasilar atelectasis is noted. Relatively diffuse coronary artery calcifications  are seen.  There is marked wall thickening along the first and second segments of the duodenum, somewhat irregular in appearance, with overlying soft tissue inflammation and trace fluid. This tracks about the adjacent head of the pancreas. This may reflect an underlying mass, or possibly inflammation in association with a duodenal ulceration.  Scattered calcifications at the head of the pancreas likely reflect sequelae of chronic pancreatitis. The pancreatic duct is  dilated to 1.0 cm, worsened from the prior study, with an enlarging 1.0 cm stone noted within the pancreatic duct at the body of the pancreas.  The liver and spleen are unremarkable in appearance. The gallbladder is within normal limits. The pancreas and adrenal glands are unremarkable.  A 1.3 cm cyst is noted at the interpole region of the left kidney. The kidneys are otherwise unremarkable in appearance. There is no evidence of hydronephrosis. No renal or ureteral stones are seen. No perinephric stranding is appreciated.  No free fluid is identified. The small bowel is unremarkable in appearance. The stomach is within normal limits. No acute vascular abnormalities are seen.  The appendix is normal in caliber and contains air. Multiple capsules are noted within the distal ileum, cecum and ascending colon, likely reflecting ingested pills. Minimal diverticulosis is noted along the proximal sigmoid colon, without evidence of diverticulitis.  The bladder is mildly distended; a tiny stone is noted at the right side of the base of the bladder, without evidence of obstruction. The prostate remains normal in size. No inguinal lymphadenopathy is seen.  No acute osseous abnormalities are identified. The patient is status post lumbar spinal fusion at L3-L5, with underlying decompression.  IMPRESSION: 1. Marked wall thickening along the first and second segments of the duodenum, somewhat irregular in appearance, with overlying soft tissue inflammation and trace fluid. This tracks about the adjacent head of the pancreas. This may reflect an underlying duodenal mass, or possibly inflammation in association with a large duodenal ulceration. Endoscopy could be considered for further evaluation, as deemed clinically appropriate. 2. Scattered calcifications at the head of the pancreas are worsened from the prior CT and likely reflect sequelae of chronic pancreatitis. Dilatation of the pancreatic duct to 1.0 cm, worsened from the  prior study, both proximal and distal to an enlarging 1.0 cm stone in the pancreatic duct at the body of the pancreas. 3. Small left renal cyst noted. 4. Multiple capsules within the distal ileum, cecum and ascending colon, likely reflecting ingested pills. 5. Minimal diverticulosis along the proximal sigmoid colon, without evidence of diverticulitis. 6. Tiny stone incidentally noted at the right side of the base of the bladder, without evidence of obstruction.   Electronically Signed   By: Garald Balding M.D.   On: 03/20/2015 02:44    ENDOSCOPIC STUDIES: None ever  IMPRESSION:   *  Epigastric pain.  Irregular appearance to duodenum, PD dilation, PD stone on CT with mild elevation of Lipase.  ? Smoldering pancreatitis.     PLAN:     *  EGD/enteroscopy.  Today   Azucena Freed  03/20/2015, 8:19 AM Pager: [phone]

## 2015-03-21 ENCOUNTER — Encounter (HOSPITAL_COMMUNITY): Payer: Self-pay | Admitting: Internal Medicine

## 2015-03-21 DIAGNOSIS — I10 Essential (primary) hypertension: Secondary | ICD-10-CM

## 2015-03-21 DIAGNOSIS — E785 Hyperlipidemia, unspecified: Secondary | ICD-10-CM | POA: Insufficient documentation

## 2015-03-21 DIAGNOSIS — F418 Other specified anxiety disorders: Secondary | ICD-10-CM

## 2015-03-21 LAB — BASIC METABOLIC PANEL
Anion gap: 8 (ref 5–15)
BUN: 14 mg/dL (ref 6–23)
CO2: 25 mmol/L (ref 19–32)
Calcium: 8.7 mg/dL (ref 8.4–10.5)
Chloride: 101 mmol/L (ref 96–112)
Creatinine, Ser: 0.81 mg/dL (ref 0.50–1.35)
GFR calc non Af Amer: >90 mL/min (ref >90)
GLUCOSE: 91 mg/dL (ref 70–99)
POTASSIUM: 3.6 mmol/L (ref 3.5–5.1)
SODIUM: 134 mmol/L — AB (ref 135–145)

## 2015-03-21 LAB — LIPASE, BLOOD: Lipase: 39 U/L (ref 11–59)

## 2015-03-21 NOTE — Progress Notes (Signed)
Daily Rounding Note  03/21/2015, 8:57 AM  LOS: 1 day   SUBJECTIVE:       Pain persists, not as severe but narcotics are on board.  A few episodes of asking for pain med (Fentanyl discontinued, now on prn dilaudid) and having to wait as dose not yet allowed.  Tolerating clears, no nausea.  Is hungry.   No dizziness when up to bathroom. Urine output 1 liter yesterday. I/O + 2.9 liters yesterday.    OBJECTIVE:         Vital signs in last 24 hours:    Temp:  [97.8 F (36.6 C)-99.4 F (37.4 C)] 98.7 F (37.1 C) (04/25 0857) Pulse Rate:  [57-71] 60 (04/25 0857) Resp:  [7-24] 17 (04/25 0857) BP: (95-129)/(53-83) 112/65 mmHg (04/25 0857) SpO2:  [93 %-100 %] 97 % (04/25 0857) Weight:  [215 lb 9.8 oz (97.8 kg)] 215 lb 9.8 oz (97.8 kg) (04/24 2100) Last BM Date: 03/16/15 Filed Weights   03/20/15 0432 03/20/15 2100  Weight: 216 lb 0.8 oz (98 kg) 215 lb 9.8 oz (97.8 kg)   General: looks well, comfortable  Heart: RRR Chest: clear bil.   Abdomen: soft, minimal upper abdominal tenderness is not focal.  Active BS  Extremities: no CCE Neuro/Psych:  Pleasant, alert, oriented x 3.  Relaxed.   Intake/Output from previous day: 04/24 0701 - 04/25 0700 In: 3912.5 [P.O.:480; I.V.:3432.5] Out: 1000 [Urine:1000]  Intake/Output this shift:    Lab Results:  Recent Labs  03/19/15 2133  WBC 14.4*  HGB 14.3  HCT 42.7  PLT 241   BMET  Recent Labs  03/19/15 2133 03/21/15 0622  NA 137 134*  K 3.4* 3.6  CL 103 101  CO2 23 25  GLUCOSE 117* 91  BUN 16 14  CREATININE 0.79 0.81  CALCIUM 9.5 8.7   LFT  Recent Labs  03/19/15 2133  PROT 7.4  ALBUMIN 3.9  AST 19  ALT 20  ALKPHOS 53  BILITOT 0.6    Studies/Results: Ct Abdomen Pelvis W Contrast  03/20/2015   CLINICAL DATA:  Acute onset of mid abdominal pain. Initial encounter.  EXAM: CT ABDOMEN AND PELVIS WITH CONTRAST  TECHNIQUE: Multidetector CT imaging of the abdomen  and pelvis was performed using the standard protocol following bolus administration of intravenous contrast.  CONTRAST:  15mL OMNIPAQUE IOHEXOL 300 MG/ML  SOLN  COMPARISON:  Abdominal ultrasound performed 03/09/2015, MRI of the lumbar spine performed 06/20/2012, and CT of the abdomen and pelvis from 03/31/2012  FINDINGS: Minimal bibasilar atelectasis is noted. Relatively diffuse coronary artery calcifications are seen.  There is marked wall thickening along the first and second segments of the duodenum, somewhat irregular in appearance, with overlying soft tissue inflammation and trace fluid. This tracks about the adjacent head of the pancreas. This may reflect an underlying mass, or possibly inflammation in association with a duodenal ulceration.  Scattered calcifications at the head of the pancreas likely reflect sequelae of chronic pancreatitis. The pancreatic duct is dilated to 1.0 cm, worsened from the prior study, with an enlarging 1.0 cm stone noted within the pancreatic duct at the body of the pancreas.  The liver and spleen are unremarkable in appearance. The gallbladder is within normal limits. The pancreas and adrenal glands are unremarkable.  A 1.3 cm cyst is noted at the interpole region of the left kidney. The kidneys are otherwise unremarkable in appearance. There is no evidence of hydronephrosis. No renal or ureteral  stones are seen. No perinephric stranding is appreciated.  No free fluid is identified. The small bowel is unremarkable in appearance. The stomach is within normal limits. No acute vascular abnormalities are seen.  The appendix is normal in caliber and contains air. Multiple capsules are noted within the distal ileum, cecum and ascending colon, likely reflecting ingested pills. Minimal diverticulosis is noted along the proximal sigmoid colon, without evidence of diverticulitis.  The bladder is mildly distended; a tiny stone is noted at the right side of the base of the bladder, without  evidence of obstruction. The prostate remains normal in size. No inguinal lymphadenopathy is seen.  No acute osseous abnormalities are identified. The patient is status post lumbar spinal fusion at L3-L5, with underlying decompression.  IMPRESSION: 1. Marked wall thickening along the first and second segments of the duodenum, somewhat irregular in appearance, with overlying soft tissue inflammation and trace fluid. This tracks about the adjacent head of the pancreas. This may reflect an underlying duodenal mass, or possibly inflammation in association with a large duodenal ulceration. Endoscopy could be considered for further evaluation, as deemed clinically appropriate. 2. Scattered calcifications at the head of the pancreas are worsened from the prior CT and likely reflect sequelae of chronic pancreatitis. Dilatation of the pancreatic duct to 1.0 cm, worsened from the prior study, both proximal and distal to an enlarging 1.0 cm stone in the pancreatic duct at the body of the pancreas. 3. Small left renal cyst noted. 4. Multiple capsules within the distal ileum, cecum and ascending colon, likely reflecting ingested pills. 5. Minimal diverticulosis along the proximal sigmoid colon, without evidence of diverticulitis. 6. Tiny stone incidentally noted at the right side of the base of the bladder, without evidence of obstruction.   Electronically Signed   By: Garald Balding M.D.   On: 03/20/2015 02:44   Scheduled Meds: . aspirin  325 mg Oral Daily  . atorvastatin  80 mg Oral Daily  . escitalopram  20 mg Oral Daily  . nebivolol  5 mg Oral Daily  . pantoprazole  40 mg Oral QAC breakfast   Continuous Infusions: . sodium chloride 150 mL/hr at 03/21/15 0933  . sodium chloride Stopped (03/20/15 1249)   PRN Meds:.ALPRAZolam, HYDROmorphone (DILAUDID) injection  ASSESMENT:   * Epigastric pain. Irregular appearance to duodenum, PD dilation, pancreatic head calcifications, PD stone on CT with mild elevation of  Lipase. Normal LFTs.  ? Smoldering pancreatitis.  No hx of acute pancreatitis but previous hx triglycerides above 1000.  4/24 EGD: mild fullness in proximal D2, likely extrinsic effect from HOP inflammation.  No associated obstruction.   *  HLD.  Labs reveal triglycerides 389 and cholesterol 274 with elevated LDL: suboptimal control.    PLAN   *  Supportive care.   Will need to make contact with [facility], [facility] re further diagnostic and interventions to PD stone. As of this AM, plan was to let tertiary care facility do EUS along with indicated intervention. Depending on clinical progress, if pt well enough to discharge, this may be a rapid outpt appt or, if not able to discharge home, inpt to inpt transfer.   *  Trial low fat diet (heart healthy)   *  Leave fluids at 150/hr.     Azucena Freed  03/21/2015, 8:57 AM Pager: [phone]

## 2015-03-21 NOTE — Progress Notes (Signed)
[facility] PROGRESS NOTE  [name] INO:[id number] DOB: [birth year] DOA: 03/19/2015 PCP: Terald Sleeper, PA-C  Assessment/Plan: 1-epigastric pain and nausea: due to mild pancreatitis -EGD revealed fullness in duodenum due to presumed inflammation in the head of the pancreas -no masses or other abnormalities inside intestine -lipase is now back to normal range and giving the fact he is not throwing up, plan is to advance diet -continue supportive care (antiemetics and analgesics)  2-GERD: continue PPI  3-HLD: continue lipitor -given elevated TG and low HDL, will add lovaza at discharge to his regimen  4-HTN: stable -continue bystolic  5-depression/anxiety: lexapro and PRN xanax  6-hx of CAD: no CP -continue ASA, B-blocker and statins  Code Status: Full Family Communication: no family at bedside Disposition Plan: remains inpatient, will slowly advance diet as tolerated and continue supportive care. Will need EUS at some point.   Consultants:  GI  Procedures:  EGD: revealing  1. The mucosa of the esophagus appeared normal 2. The mucosa of the stomach appeared normal 3. Mild fullness, most likely from head of pancreas inflammation in the early 2nd part of the duodenum. Normal duodenal mucosa throughout and no evidence of duodenum obstruction.  Antibiotics:  None   HPI/Subjective: Feeling better. Still with some mid abd/epigastric pain and mils nausea. No fever.  Objective: Filed Vitals:   03/21/15 2114  BP: 121/64  Pulse: 57  Temp: 98.8 F (37.1 C)  Resp: 17    Intake/Output Summary (Last 24 hours) at 03/21/15 2258 Last data filed at 03/21/15 2102  Gross per 24 hour  Intake   3695 ml  Output   2800 ml  Net    895 ml   Filed Weights   03/20/15 0432 03/20/15 2100  Weight: 98 kg (216 lb 0.8 oz) 97.8 kg (215 lb 9.8 oz)    Exam:   General:  Feeling slightly better, but still with abd pain and some nausea. No fever Lipase  WNL  Cardiovascular: S1 and S2, no rubs or gallops  Respiratory: CTA bilaterally  Abdomen: soft, no guarding, tender to palpation epigastric and mid abd area, no guarding. Positive BS  Musculoskeletal: no edema or cyanosis  Data Reviewed: Basic Metabolic Panel:  Recent Labs Lab 03/19/15 2133 03/21/15 0622  NA 137 134*  K 3.4* 3.6  CL 103 101  CO2 23 25  GLUCOSE 117* 91  BUN 16 14  CREATININE 0.79 0.81  CALCIUM 9.5 8.7   Liver Function Tests:  Recent Labs Lab 03/19/15 2133  AST 19  ALT 20  ALKPHOS 53  BILITOT 0.6  PROT 7.4  ALBUMIN 3.9    Recent Labs Lab 03/19/15 2133 03/21/15 0622  LIPASE 116* 39   CBC:  Recent Labs Lab 03/19/15 2133  WBC 14.4*  NEUTROABS 11.0*  HGB 14.3  HCT 42.7  MCV 86.8  PLT 241   CBG:  Recent Labs Lab 03/20/15 0435 03/20/15 0711  GLUCAP 115* 123*    Studies: Ct Abdomen Pelvis W Contrast  03/20/2015   CLINICAL DATA:  Acute onset of mid abdominal pain. Initial encounter.  EXAM: CT ABDOMEN AND PELVIS WITH CONTRAST  TECHNIQUE: Multidetector CT imaging of the abdomen and pelvis was performed using the standard protocol following bolus administration of intravenous contrast.  CONTRAST:  111mL OMNIPAQUE IOHEXOL 300 MG/ML  SOLN  COMPARISON:  Abdominal ultrasound performed 03/09/2015, MRI of the lumbar spine performed 06/20/2012, and CT of the abdomen and pelvis from 03/31/2012  FINDINGS: Minimal bibasilar atelectasis is noted. Relatively  diffuse coronary artery calcifications are seen.  There is marked wall thickening along the first and second segments of the duodenum, somewhat irregular in appearance, with overlying soft tissue inflammation and trace fluid. This tracks about the adjacent head of the pancreas. This may reflect an underlying mass, or possibly inflammation in association with a duodenal ulceration.  Scattered calcifications at the head of the pancreas likely reflect sequelae of chronic pancreatitis. The pancreatic duct  is dilated to 1.0 cm, worsened from the prior study, with an enlarging 1.0 cm stone noted within the pancreatic duct at the body of the pancreas.  The liver and spleen are unremarkable in appearance. The gallbladder is within normal limits. The pancreas and adrenal glands are unremarkable.  A 1.3 cm cyst is noted at the interpole region of the left kidney. The kidneys are otherwise unremarkable in appearance. There is no evidence of hydronephrosis. No renal or ureteral stones are seen. No perinephric stranding is appreciated.  No free fluid is identified. The small bowel is unremarkable in appearance. The stomach is within normal limits. No acute vascular abnormalities are seen.  The appendix is normal in caliber and contains air. Multiple capsules are noted within the distal ileum, cecum and ascending colon, likely reflecting ingested pills. Minimal diverticulosis is noted along the proximal sigmoid colon, without evidence of diverticulitis.  The bladder is mildly distended; a tiny stone is noted at the right side of the base of the bladder, without evidence of obstruction. The prostate remains normal in size. No inguinal lymphadenopathy is seen.  No acute osseous abnormalities are identified. The patient is status post lumbar spinal fusion at L3-L5, with underlying decompression.  IMPRESSION: 1. Marked wall thickening along the first and second segments of the duodenum, somewhat irregular in appearance, with overlying soft tissue inflammation and trace fluid. This tracks about the adjacent head of the pancreas. This may reflect an underlying duodenal mass, or possibly inflammation in association with a large duodenal ulceration. Endoscopy could be considered for further evaluation, as deemed clinically appropriate. 2. Scattered calcifications at the head of the pancreas are worsened from the prior CT and likely reflect sequelae of chronic pancreatitis. Dilatation of the pancreatic duct to 1.0 cm, worsened from the  prior study, both proximal and distal to an enlarging 1.0 cm stone in the pancreatic duct at the body of the pancreas. 3. Small left renal cyst noted. 4. Multiple capsules within the distal ileum, cecum and ascending colon, likely reflecting ingested pills. 5. Minimal diverticulosis along the proximal sigmoid colon, without evidence of diverticulitis. 6. Tiny stone incidentally noted at the right side of the base of the bladder, without evidence of obstruction.   Electronically Signed   By: Garald Balding M.D.   On: 03/20/2015 02:44    Scheduled Meds: . aspirin  325 mg Oral Daily  . atorvastatin  80 mg Oral Daily  . escitalopram  20 mg Oral Daily  . nebivolol  5 mg Oral Daily  . pantoprazole  40 mg Oral QAC breakfast   Continuous Infusions: . sodium chloride 150 mL/hr at 03/21/15 0933  . sodium chloride Stopped (03/20/15 1249)    Principal Problem:   Duodenal anomaly Active Problems:   Pancreatitis   Acute pancreatitis   Abnormal CT of the abdomen   Abdominal pain, epigastric    Time spent: 30 minutes    Barton Dubois  [facility] Pager [phone] If 7PM-7AM, please contact night-coverage at [email or web address], password [facility] 03/21/2015, 10:58 PM  LOS: 1 day

## 2015-03-22 ENCOUNTER — Encounter (HOSPITAL_COMMUNITY): Payer: Self-pay | Admitting: Internal Medicine

## 2015-03-22 LAB — CBC
HEMATOCRIT: 37.4 % — AB (ref 39.0–52.0)
HEMOGLOBIN: 12.2 g/dL — AB (ref 13.0–17.0)
MCH: 28.4 pg (ref 26.0–34.0)
MCHC: 32.6 g/dL (ref 30.0–36.0)
MCV: 87.2 fL (ref 78.0–100.0)
Platelets: 212 10*3/uL (ref 150–400)
RBC: 4.29 MIL/uL (ref 4.22–5.81)
RDW: 12.7 % (ref 11.5–15.5)
WBC: 7.5 10*3/uL (ref 4.0–10.5)

## 2015-03-22 LAB — BASIC METABOLIC PANEL
Anion gap: 7 (ref 5–15)
BUN: 11 mg/dL (ref 6–23)
CALCIUM: 9.3 mg/dL (ref 8.4–10.5)
CO2: 26 mmol/L (ref 19–32)
Chloride: 107 mmol/L (ref 96–112)
Creatinine, Ser: 0.79 mg/dL (ref 0.50–1.35)
GFR calc Af Amer: >90 mL/min (ref >90)
GFR calc non Af Amer: >90 mL/min (ref >90)
GLUCOSE: 115 mg/dL — AB (ref 70–99)
Potassium: 4.1 mmol/L (ref 3.5–5.1)
Sodium: 140 mmol/L (ref 135–145)

## 2015-03-22 MED ORDER — OXYCODONE HCL 5 MG PO TABS
5.0000 mg | ORAL_TABLET | Freq: Four times a day (QID) | ORAL | Status: DC | PRN
  Administered 2015-03-23 (×2): 5 mg via ORAL
  Filled 2015-03-22 (×2): qty 1

## 2015-03-22 MED ORDER — HYDROMORPHONE HCL 1 MG/ML IJ SOLN: 1.0000 mg | Freq: Four times a day (QID) | INTRAMUSCULAR | Status: DC | PRN

## 2015-03-22 NOTE — Progress Notes (Signed)
          Daily Rounding Note  03/22/2015, 9:37 AM  LOS: 2 days   SUBJECTIVE:       Still some pain mostly in LUQ, worsens with meals but not reaching intensity of previous days.  No nausea.  Having BMs and passing flatus.  Overall feeling better. Not requiring as much Dilaudid.    OBJECTIVE:         Vital signs in last 24 hours:    Temp:  [98.1 F (36.7 C)-98.8 F (37.1 C)] 98.5 F (36.9 C) (04/26 0926) Pulse Rate:  [57-68] 58 (04/26 0926) Resp:  [17-49] 20 (04/26 0926) BP: (104-139)/(63-92) 139/82 mmHg (04/26 0926) SpO2:  [96 %-98 %] 98 % (04/26 0926) Weight:  [227 lb 4.7 oz (103.1 kg)] 227 lb 4.7 oz (103.1 kg) (04/26 0513) Last BM Date: 03/21/15 Filed Weights   03/20/15 0432 03/20/15 2100 03/22/15 0513  Weight: 216 lb 0.8 oz (98 kg) 215 lb 9.8 oz (97.8 kg) 227 lb 4.7 oz (103.1 kg)   General: looks well, comfortable   Heart: RRR Chest: clear bil.  No sob or cough.  Abdomen: soft, active BS, slight protuberance.  Slight left UQ tenderness.   Extremities: no CCE.  Neuro/Psych:  Pleasant, fully alert and oriented.   Intake/Output from previous day: 04/25 0701 - 04/26 0700 In: 3920 [P.O.:920; I.V.:3000] Out: 3300 [Urine:3300]  Intake/Output this shift: Total I/O In: 120 [P.O.:120] Out: -   Lab Results:  Recent Labs  03/19/15 2133 03/22/15 0745  WBC 14.4* 7.5  HGB 14.3 12.2*  HCT 42.7 37.4*  PLT 241 212   BMET  Recent Labs  03/19/15 2133 03/21/15 0622  NA 137 134*  K 3.4* 3.6  CL 103 101  CO2 23 25  GLUCOSE 117* 91  BUN 16 14  CREATININE 0.79 0.81  CALCIUM 9.5 8.7   LFT  Recent Labs  03/19/15 2133  PROT 7.4  ALBUMIN 3.9  AST 19  ALT 20  ALKPHOS 53  BILITOT 0.6   Scheduled Meds: . aspirin  325 mg Oral Daily  . atorvastatin  80 mg Oral Daily  . escitalopram  20 mg Oral Daily  . nebivolol  5 mg Oral Daily  . pantoprazole  40 mg Oral QAC breakfast   Continuous Infusions: . sodium  chloride 150 mL/hr at 03/21/15 2200  . sodium chloride Stopped (03/20/15 1249)   PRN Meds:.ALPRAZolam, HYDROmorphone (DILAUDID) injection   ASSESMENT:   * Epigastric pain. ? Smoldering pancreatitis?  Irregular appearance to duodenum, PD dilation, pancreatic head calcifications, PD stone on CT with mild elevation of Lipase. Normal LFTs. No hx of acute pancreatitis but previous hx triglycerides above 1000.  4/24 EGD: mild fullness in proximal D2, likely extrinsic effect from HOP inflammation. No associated obstruction.  Clinically improving.   * HLD. Labs 07/2014: triglycerides 389 and cholesterol 274 with elevated LDL: suboptimal control.     PLAN   *  Supportive care.  Looks like he may be able to discharge and be seen at tertiary facility as outpt .  *  Leave HH diet in place.  *  Consider adding hydrocodone in place of Dilaudid for pain mgt, as if he discharges will need to assure that oral agent can effectively control pain.     Azucena Freed  03/22/2015, 9:37 AM Pager: [phone]

## 2015-03-22 NOTE — Progress Notes (Signed)
Pt reports that pain is worse shortly after he eats and requires IV pain medication for relief.

## 2015-03-22 NOTE — Progress Notes (Signed)
[facility] PROGRESS NOTE  [name] XLK:[id number] DOB: March 19, 1961 DOA: 03/19/2015 PCP: Terald Sleeper, PA-C  Assessment/Plan: 1-epigastric pain and nausea: due to mild pancreatitis -EGD revealed fullness in duodenum due to presumed inflammation in the head of the pancreas -no masses or other abnormalities inside intestine -lipase is now back to normal range and giving the fact he is not throwing up, plan is to advance diet and to switch pain meds to PO -will require EUS and potentially ERCP as an outpatient. Will follow GI rec's -continue supportive care (antiemetics and analgesics)  2-GERD: continue PPI  3-HLD: continue lipitor -given elevated TG and low HDL, will add lovaza at discharge to his current regimen  4-HTN: stable -continue bystolic  5-depression/anxiety: lexapro and PRN xanax  6-hx of CAD: no CP -continue ASA, B-blocker and statins  Code Status: Full Family Communication: no family at bedside Disposition Plan: remains inpatient, will slowly advance diet as tolerated and continue supportive care. Will need EUS at some point.   Consultants:  GI  Procedures:  EGD: revealing  1. The mucosa of the esophagus appeared normal 2. The mucosa of the stomach appeared normal 3. Mild fullness, most likely from head of pancreas inflammation in the early 2nd part of the duodenum. Normal duodenal mucosa throughout and no evidence of duodenum obstruction.  Antibiotics:  None   HPI/Subjective: Feeling better. No fever, no vomiting and with normal WBC's and lipase lvel. Still with pain around food intake  Objective: Filed Vitals:   03/22/15 2059  BP: 135/83  Pulse: 55  Temp: 97.4 F (36.3 C)  Resp: 18    Intake/Output Summary (Last 24 hours) at 03/22/15 2224 Last data filed at 03/22/15 1734  Gross per 24 hour  Intake    840 ml  Output   2901 ml  Net  -2061 ml   Filed Weights   03/20/15 2100 03/22/15 0513 03/22/15 2059  Weight: 97.8 kg  (215 lb 9.8 oz) 103.1 kg (227 lb 4.7 oz) 103.5 kg (228 lb 2.8 oz)    Exam:   General:  Feeling better, no fever, no vomiting and with resolution of elevated WBC's and lipase. Still with abd pain around food intake.  Cardiovascular: S1 and S2, no rubs or gallops  Respiratory: CTA bilaterally  Abdomen: soft, no rebound, mild tenderness to palpation epigastric and mid abd area, no guarding. Positive BS  Musculoskeletal: no edema or cyanosis  Data Reviewed: Basic Metabolic Panel:  Recent Labs Lab 03/19/15 2133 03/21/15 0622 03/22/15 0745  NA 137 134* 140  K 3.4* 3.6 4.1  CL 103 101 107  CO2 23 25 26   GLUCOSE 117* 91 115*  BUN 16 14 11   CREATININE 0.79 0.81 0.79  CALCIUM 9.5 8.7 9.3   Liver Function Tests:  Recent Labs Lab 03/19/15 2133  AST 19  ALT 20  ALKPHOS 53  BILITOT 0.6  PROT 7.4  ALBUMIN 3.9    Recent Labs Lab 03/19/15 2133 03/21/15 0622  LIPASE 116* 39   CBC:  Recent Labs Lab 03/19/15 2133 03/22/15 0745  WBC 14.4* 7.5  NEUTROABS 11.0*  --   HGB 14.3 12.2*  HCT 42.7 37.4*  MCV 86.8 87.2  PLT 241 212   CBG:  Recent Labs Lab 03/20/15 0435 03/20/15 0711  GLUCAP 115* 123*    Studies: No results found.  Scheduled Meds: . aspirin  325 mg Oral Daily  . atorvastatin  80 mg Oral Daily  . escitalopram  20 mg Oral Daily  .  nebivolol  5 mg Oral Daily  . pantoprazole  40 mg Oral QAC breakfast   Continuous Infusions: . sodium chloride 150 mL/hr at 03/22/15 1258  . sodium chloride 20 mL/hr at 03/22/15 2041    Principal Problem:   Duodenal anomaly Active Problems:   Pancreatitis   Acute pancreatitis   Abnormal CT of the abdomen   Abdominal pain, epigastric   Benign essential HTN   Depression with anxiety   HLD (hyperlipidemia)    Time spent: 30 minutes    Barton Dubois  [facility] Pager [phone] If 7PM-7AM, please contact night-coverage at [email or web address], password [email or web address] 03/22/2015, 10:24 PM  LOS: 2 days

## 2015-03-23 DIAGNOSIS — K850 Idiopathic acute pancreatitis: Secondary | ICD-10-CM

## 2015-03-23 LAB — BASIC METABOLIC PANEL
Anion gap: 6 (ref 5–15)
BUN: 10 mg/dL (ref 6–23)
CO2: 26 mmol/L (ref 19–32)
Calcium: 9.1 mg/dL (ref 8.4–10.5)
Chloride: 104 mmol/L (ref 96–112)
Creatinine, Ser: 0.83 mg/dL (ref 0.50–1.35)
GFR calc Af Amer: >90 mL/min (ref >90)
GFR calc non Af Amer: >90 mL/min (ref >90)
Glucose, Bld: 99 mg/dL (ref 70–99)
POTASSIUM: 3.8 mmol/L (ref 3.5–5.1)
SODIUM: 136 mmol/L (ref 135–145)

## 2015-03-23 LAB — CBC
HEMATOCRIT: 36.3 % — AB (ref 39.0–52.0)
HEMOGLOBIN: 12 g/dL — AB (ref 13.0–17.0)
MCH: 28.4 pg (ref 26.0–34.0)
MCHC: 33.1 g/dL (ref 30.0–36.0)
MCV: 85.8 fL (ref 78.0–100.0)
Platelets: 208 10*3/uL (ref 150–400)
RBC: 4.23 MIL/uL (ref 4.22–5.81)
RDW: 12.5 % (ref 11.5–15.5)
WBC: 5.6 10*3/uL (ref 4.0–10.5)

## 2015-03-23 MED ORDER — OXYCODONE HCL 5 MG PO TABS: 5.0000 mg | ORAL_TABLET | Freq: Four times a day (QID) | ORAL | Status: DC | PRN

## 2015-03-23 NOTE — Progress Notes (Signed)
Daily Rounding Note  03/23/2015, 10:57 AM  LOS: 3 days   SUBJECTIVE:       No significant pain, and any pain is controlled with oral ocycodone 5 mg.  Ate eggs, toast this AM.  Having BMs, no nausea.  Feels well and ok with plan to discharge to home.   Urinating a lot with IFV still at 125/hour.   OBJECTIVE:         Vital signs in last 24 hours:    Temp:  [97.4 F (36.3 C)-98.2 F (36.8 C)] 98.2 F (36.8 C) (04/27 0854) Pulse Rate:  [50-77] 64 (04/27 0854) Resp:  [18-20] 18 (04/27 0854) BP: (129-140)/(74-83) 129/74 mmHg (04/27 0854) SpO2:  [94 %-98 %] 95 % (04/27 0854) Weight:  [228 lb 2.8 oz (103.5 kg)] 228 lb 2.8 oz (103.5 kg) (04/26 2059) Last BM Date: 03/22/15 Filed Weights   03/20/15 2100 03/22/15 0513 03/22/15 2059  Weight: 215 lb 9.8 oz (97.8 kg) 227 lb 4.7 oz (103.1 kg) 228 lb 2.8 oz (103.5 kg)   General: looks well   Heart: RRR Chest: clear bil.  No dyspnea or cough Abdomen: soft, NT, active BS.    Extremities: no CCE Neuro/Psych:  Pleasant, in good spirits, no deficits or tremor.  Fully alert/oriented x 3.   Intake/Output from previous day: 04/26 0701 - 04/27 0700 In: 691.3 [P.O.:240; I.V.:451.3] Out: 1901 [Urine:1900; Stool:1]  Intake/Output this shift: Total I/O In: 240 [P.O.:240] Out: 0   Lab Results:  Recent Labs  03/22/15 0745 03/23/15 0618  WBC 7.5 5.6  HGB 12.2* 12.0*  HCT 37.4* 36.3*  PLT 212 208   BMET  Recent Labs  03/21/15 0622 03/22/15 0745 03/23/15 0618  NA 134* 140 136  K 3.6 4.1 3.8  CL 101 107 104  CO2 25 26 26   GLUCOSE 91 115* 99  BUN 14 11 10   CREATININE 0.81 0.79 0.83  CALCIUM 8.7 9.3 9.1    Studies/Results: No results found.  Scheduled Meds: . aspirin  325 mg Oral Daily  . atorvastatin  80 mg Oral Daily  . escitalopram  20 mg Oral Daily  . nebivolol  5 mg Oral Daily  . pantoprazole  40 mg Oral QAC breakfast   Continuous Infusions: . sodium chloride  20 mL/hr at 03/22/15 2041   PRN Meds:.ALPRAZolam, HYDROmorphone (DILAUDID) injection, oxyCODONE   ASSESMENT:   * Epigastric pain. Irregular appearance to duodenum, PD dilation, pancreatic head calcifications, PD stone on CT with mild elevation of Lipase. Normal LFTs. ? Smoldering pancreatitis. No hx of acute pancreatitis but previous hx triglycerides above 1000.  4/24 EGD: mild fullness in proximal D2, likely extrinsic effect from HOP inflammation. No associated obstruction.  Clinically much improved, pain decreased, tolerating po and safe for discharge home.  Suspect pain to lionger/recur so needs adequate supply of oxycodone to tide him over.   * HLD. Labs reveal triglycerides 389 and cholesterol 274 with elevated LDL: suboptimal control.     PLAN   *  Needs Rx for oxycodone at discharge, to tide him over till follow up at [facility].   *  I gave pt cc of GI consult notes and CMET/CBCs since admission.  May help to give him a cc of his discharge summary to take with him to [facility]. If he has acute recurrence of sxs, pt has been advised to go to ED at [facility] so he can be admitted directly there, otherwise just plan to  keep appt there. Dr Hilarie Fredrickson plans to contact pt once follow up has been arranged.  Pt's wife's cell # is [phone] and this is best way to convey information to pt.       Azucena Freed  03/23/2015, 10:57 AM Pager: [phone]

## 2015-03-23 NOTE — Progress Notes (Signed)
Discharged patient with his step parents,patient aware of his [facility] appointment is still in progress and needs to contact his [facility] [place].

## 2015-03-23 NOTE — Discharge Summary (Signed)
Physician Discharge Summary  [name] ASN:[id number] DOB: [birth year] DOA: 03/19/2015  PCP: Terald Sleeper, PA-C  Admit date: 03/19/2015 Discharge date: 03/23/2015  Time spent: 35 minutes  Recommendations for Outpatient Follow-up:  1. Needs to follow up at [facility] for pancreas stone. Need EUS and ERCP.   Discharge Diagnoses:    Acute pancreatitis   Abnormal CT of the abdomen   Abdominal pain, epigastric   Benign essential HTN   Depression with anxiety   HLD (hyperlipidemia)   Discharge Condition: Stable.   Diet recommendation: Heart Heallthy  Filed Weights   03/20/15 2100 03/22/15 0513 03/22/15 2059  Weight: 97.8 kg (215 lb 9.8 oz) 103.1 kg (227 lb 4.7 oz) 103.5 kg (228 lb 2.8 oz)    History of present illness:  HPI: [name] is a 54 y.o. male who presents to the ED with epigastric pain, intermittent, onset "months" ago. Got worse 3 days ago. Is severe, 10/10 after eating. Unable to eat due to pain. Patient ultimately had cardiac cath which was normal. Had US done with GI which was normal 10 days ago. Has EGD scheduled for first week of May. On Carafate and omeprazole BID.  Hospital Course:  1-Epigastric pain and nausea: due to pancreatitis -EGD revealed fullness in duodenum due to presumed inflammation in the head of the pancreas -no masses or other abnormalities inside intestine -lipase is now back to normal range.  -will require EUS and potentially ERCP as an outpatient. Will follow GI rec's -continue supportive care (antiemetics and analgesics) CT abdomen and pelvis : Scattered calcifications at the head of the pancreas are worsened from the prior CT and likely reflect sequelae of chronic pancreatitis. Dilatation of the pancreatic duct to 1.0 cm, worsened from the prior study, both proximal and distal to an enlarging 1.0 cm stone in the pancreatic duct at the body of the pancreas. Needs to follow up at [facility]. GI is helping with referral.  Patient tolerating  diet. Will provide oxycodone for pain controlled.   2-GERD: continue PPI  3-HLD: continue lipitor -given elevated TG and low HDL.   4-HTN: stable -continue bystolic  5-depression/anxiety: lexapro and PRN xanax  6-hx of CAD: no CP -continue ASA, B-blocker and statins  Procedures:  EGD: revealing 1. The mucosa of the esophagus appeared normal 2. The mucosa of the stomach appeared normal 3. Mild fullness, most likely from head of pancreas inflammation in the early 2nd part of the duodenum. Normal duodenal mucosa throughout and no evidence of duodenum obstruction.  Consultations:  GI, Dr Hilarie Fredrickson.   Discharge Exam: Filed Vitals:   03/23/15 0854  BP: 129/74  Pulse: 64  Temp: 98.2 F (36.8 C)  Resp: 18    General: Alert in no distress.  Cardiovascular: S 1, S 2 RRR Respiratory: CTA Abdomen; soft NT  Discharge Instructions   Discharge Instructions    Diet - low sodium heart healthy    Complete by:  As directed      Increase activity slowly    Complete by:  As directed           Current Discharge Medication List    START taking these medications   Details  oxyCODONE (OXY IR/ROXICODONE) 5 MG immediate release tablet Take 1 tablet (5 mg total) by mouth every 6 (six) hours as needed for moderate pain. Qty: 30 tablet, Refills: 0      CONTINUE these medications which have NOT CHANGED   Details  ALPRAZolam (XANAX) 0.25 MG tablet Take  1 tablet (0.25 mg total) by mouth at bedtime as needed for anxiety. Qty: 30 tablet, Refills: 1    aspirin 325 MG tablet Take 325 mg by mouth daily.    atorvastatin (LIPITOR) 80 MG tablet Take 1 tablet (80 mg total) by mouth daily. Qty: 30 tablet, Refills: 11    B Complex-C (SUPER B COMPLEX PO) Take 1 tablet by mouth daily.    Cholecalciferol (VITAMIN D) 2000 UNITS tablet Take 2,000 Units by mouth daily.    Choline Fenofibrate 135 MG capsule Take 1 capsule (135 mg total) by mouth daily. Qty: 90 capsule, Refills: 1     escitalopram (LEXAPRO) 20 MG tablet Take 1 tablet (20 mg total) by mouth daily. Qty: 90 tablet, Refills: 1    nebivolol (BYSTOLIC) 5 MG tablet Take 1 tablet (5 mg total) by mouth daily. Qty: 90 tablet, Refills: 1    Omega-3 Fatty Acids (FISH OIL) 1000 MG CPDR Take 2,000 mg by mouth daily.     omeprazole (PRILOSEC) 40 MG capsule Take 40 mg by mouth 2 (two) times daily.       STOP taking these medications     Coenzyme Q10 (CO Q 10) 100 MG CAPS      HYDROcodone-acetaminophen (NORCO/VICODIN) 5-325 MG per tablet      hyoscyamine (LEVBID) 0.375 MG 12 hr tablet      sucralfate (CARAFATE) 1 GM/10ML suspension      traMADol (ULTRAM) 50 MG tablet        No Known Allergies Follow-up Information    Follow up with PYRTLE, Lajuan Lines, MD Today.   Specialty:  Gastroenterology   Contact information:   [address] [place] [address] [phone]        The results of significant diagnostics from this hospitalization (including imaging, microbiology, ancillary and laboratory) are listed below for reference.    Significant Diagnostic Studies: US Abdomen Complete  03/09/2015   CLINICAL DATA:  Epigastric pain. Colon cancer screening. Nausea and vomiting.  EXAM: ULTRASOUND ABDOMEN COMPLETE  COMPARISON:  CT abdomen 03/31/2012  FINDINGS: Gallbladder: No gallstones or wall thickening visualized. No sonographic Murphy sign noted.  Common bile duct: Diameter: 4.0 mm  Liver: Diffuse fatty infiltration of the liver with focal fatty sparing near the gallbladder fossa.  IVC: No abnormality visualized.  Pancreas: Not visualized due to bowel gas.  Spleen: Size and appearance within normal limits.  Right Kidney: Length: 13.3 cm. Echogenicity within normal limits. No mass or hydronephrosis visualized.  Left Kidney: Length: 13.0 cm in length. 14 mm midpole cyst. Echogenicity within normal limits. No mass or hydronephrosis visualized.  Abdominal aorta: No aneurysm visualized.  Other findings: None.   IMPRESSION: Negative for gallstones  Fatty infiltration of the liver with fatty sparing near the gallbladder fossa.   Electronically Signed   By: Franchot Gallo M.D.   On: 03/09/2015 13:55   Ct Abdomen Pelvis W Contrast  03/20/2015   CLINICAL DATA:  Acute onset of mid abdominal pain. Initial encounter.  EXAM: CT ABDOMEN AND PELVIS WITH CONTRAST  TECHNIQUE: Multidetector CT imaging of the abdomen and pelvis was performed using the standard protocol following bolus administration of intravenous contrast.  CONTRAST:  182mL OMNIPAQUE IOHEXOL 300 MG/ML  SOLN  COMPARISON:  Abdominal ultrasound performed 03/09/2015, MRI of the lumbar spine performed 06/20/2012, and CT of the abdomen and pelvis from 03/31/2012  FINDINGS: Minimal bibasilar atelectasis is noted. Relatively diffuse coronary artery calcifications are seen.  There is marked wall thickening along the first  and second segments of the duodenum, somewhat irregular in appearance, with overlying soft tissue inflammation and trace fluid. This tracks about the adjacent head of the pancreas. This may reflect an underlying mass, or possibly inflammation in association with a duodenal ulceration.  Scattered calcifications at the head of the pancreas likely reflect sequelae of chronic pancreatitis. The pancreatic duct is dilated to 1.0 cm, worsened from the prior study, with an enlarging 1.0 cm stone noted within the pancreatic duct at the body of the pancreas.  The liver and spleen are unremarkable in appearance. The gallbladder is within normal limits. The pancreas and adrenal glands are unremarkable.  A 1.3 cm cyst is noted at the interpole region of the left kidney. The kidneys are otherwise unremarkable in appearance. There is no evidence of hydronephrosis. No renal or ureteral stones are seen. No perinephric stranding is appreciated.  No free fluid is identified. The small bowel is unremarkable in appearance. The stomach is within normal limits. No acute vascular  abnormalities are seen.  The appendix is normal in caliber and contains air. Multiple capsules are noted within the distal ileum, cecum and ascending colon, likely reflecting ingested pills. Minimal diverticulosis is noted along the proximal sigmoid colon, without evidence of diverticulitis.  The bladder is mildly distended; a tiny stone is noted at the right side of the base of the bladder, without evidence of obstruction. The prostate remains normal in size. No inguinal lymphadenopathy is seen.  No acute osseous abnormalities are identified. The patient is status post lumbar spinal fusion at L3-L5, with underlying decompression.  IMPRESSION: 1. Marked wall thickening along the first and second segments of the duodenum, somewhat irregular in appearance, with overlying soft tissue inflammation and trace fluid. This tracks about the adjacent head of the pancreas. This may reflect an underlying duodenal mass, or possibly inflammation in association with a large duodenal ulceration. Endoscopy could be considered for further evaluation, as deemed clinically appropriate. 2. Scattered calcifications at the head of the pancreas are worsened from the prior CT and likely reflect sequelae of chronic pancreatitis. Dilatation of the pancreatic duct to 1.0 cm, worsened from the prior study, both proximal and distal to an enlarging 1.0 cm stone in the pancreatic duct at the body of the pancreas. 3. Small left renal cyst noted. 4. Multiple capsules within the distal ileum, cecum and ascending colon, likely reflecting ingested pills. 5. Minimal diverticulosis along the proximal sigmoid colon, without evidence of diverticulitis. 6. Tiny stone incidentally noted at the right side of the base of the bladder, without evidence of obstruction.   Electronically Signed   By: Garald Balding M.D.   On: 03/20/2015 02:44    Microbiology: No results found for this or any previous visit (from the past 240 hour(s)).   Labs: Basic Metabolic  Panel:  Recent Labs Lab 03/19/15 2133 03/21/15 0622 03/22/15 0745 03/23/15 0618  NA 137 134* 140 136  K 3.4* 3.6 4.1 3.8  CL 103 101 107 104  CO2 23 25 26 26   GLUCOSE 117* 91 115* 99  BUN 16 14 11 10   CREATININE 0.79 0.81 0.79 0.83  CALCIUM 9.5 8.7 9.3 9.1   Liver Function Tests:  Recent Labs Lab 03/19/15 2133  AST 19  ALT 20  ALKPHOS 53  BILITOT 0.6  PROT 7.4  ALBUMIN 3.9    Recent Labs Lab 03/19/15 2133 03/21/15 0622  LIPASE 116* 39   No results for input(s): AMMONIA in the last 168 hours. CBC:  Recent  Labs Lab 03/19/15 2133 03/22/15 0745 03/23/15 0618  WBC 14.4* 7.5 5.6  NEUTROABS 11.0*  --   --   HGB 14.3 12.2* 12.0*  HCT 42.7 37.4* 36.3*  MCV 86.8 87.2 85.8  PLT 241 212 208   Cardiac Enzymes: No results for input(s): CKTOTAL, CKMB, CKMBINDEX, TROPONINI in the last 168 hours. BNP: BNP (last 3 results) No results for input(s): BNP in the last 8760 hours.  ProBNP (last 3 results) No results for input(s): PROBNP in the last 8760 hours.  CBG:  Recent Labs Lab 03/20/15 0435 03/20/15 0711  GLUCAP 115* 123*       Signed:  Regalado, Belkys A  [facility] 03/23/2015, 1:02 PM

## 2015-03-23 NOTE — Progress Notes (Signed)
Primary  M.D. Made aware that [facility] Is trying to make an appointment at [facility] for this patient.As per Sarah,they would give a call to him once the appointment is confirmed.O.k. For the G.I[facility] To discharged at this time.

## 2015-03-25 ENCOUNTER — Telehealth: Payer: Self-pay | Admitting: Internal Medicine

## 2015-03-25 NOTE — Telephone Encounter (Signed)
Pts wife called and he is scheduled to see Dr. Jola Schmidt 04/04/15@8am . Pt needs CT images on a disc to take with them to the appt. Disc being made and pt may pick up at [facility] radiology Monday am.   Wife wants to know if they need to reschedule his colon. Concerned about him drinking the prep and having problems with his pancreatic stone causing problems again. Please advise.

## 2015-03-25 NOTE — Telephone Encounter (Signed)
Pts wife calling to see if appt had been made at [facility]. Spoke with Azucena Freed PA and she states that Dr. Hilarie Fredrickson was to make the contact for this appt. Please advise.

## 2015-03-27 NOTE — Telephone Encounter (Signed)
Ok to delay colonoscopy until after [facility] appt, can schedule for sometime in June Thanks

## 2015-03-28 NOTE — Telephone Encounter (Signed)
Colon appt cancelled, pt aware. Wife states they will call back to reschedule after the [facility] appt.

## 2015-04-01 ENCOUNTER — Encounter: Payer: Self-pay | Admitting: Internal Medicine

## 2015-04-27 HISTORY — PX: ERCP: SHX60

## 2015-04-28 DIAGNOSIS — Z8719 Personal history of other diseases of the digestive system: Secondary | ICD-10-CM | POA: Insufficient documentation

## 2015-04-28 DIAGNOSIS — Z8639 Personal history of other endocrine, nutritional and metabolic disease: Secondary | ICD-10-CM | POA: Insufficient documentation

## 2015-05-17 ENCOUNTER — Encounter: Payer: Self-pay | Admitting: Internal Medicine

## 2015-05-19 NOTE — Telephone Encounter (Signed)
Spoke with patient and his wife by phone to answer questions regarding pancreatic surgery recommended by Dr. 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

## 2015-05-23 ENCOUNTER — Other Ambulatory Visit: Payer: Self-pay

## 2015-07-18 DIAGNOSIS — Q453 Other congenital malformations of pancreas and pancreatic duct: Secondary | ICD-10-CM | POA: Insufficient documentation

## 2015-07-27 ENCOUNTER — Ambulatory Visit: Payer: PRIVATE HEALTH INSURANCE | Admitting: Family Medicine

## 2015-07-28 ENCOUNTER — Encounter: Payer: Self-pay | Admitting: Family Medicine

## 2015-07-28 ENCOUNTER — Ambulatory Visit (INDEPENDENT_AMBULATORY_CARE_PROVIDER_SITE_OTHER): Payer: 59 | Admitting: Family Medicine

## 2015-07-28 ENCOUNTER — Ambulatory Visit: Payer: PRIVATE HEALTH INSURANCE | Admitting: Family Medicine

## 2015-07-28 VITALS — BP 123/78 | HR 69 | Temp 98.6°F | Ht 71.0 in | Wt 218.4 lb

## 2015-07-28 DIAGNOSIS — A6920 Lyme disease, unspecified: Secondary | ICD-10-CM | POA: Diagnosis not present

## 2015-07-28 MED ORDER — DOXYCYCLINE HYCLATE 100 MG PO TABS: 100.0000 mg | ORAL_TABLET | Freq: Two times a day (BID) | ORAL | Status: AC

## 2015-07-28 NOTE — Patient Instructions (Signed)
Lyme Disease You may have been bitten by a tick and are to watch for the development of Lyme Disease. Lyme Disease is an infection that is caused by a bacteria The bacteria causing this disease is named Borreilia burgdorferi. If a tick is infected with this bacteria and then bites you, then Lyme Disease may occur. These ticks are carried by deer and rodents such as rabbits and mice and infest grassy as well as forested areas. Fortunately most tick bites do not cause Lyme Disease.  Lyme Disease is easier to prevent than to treat. First, covering your legs with clothing when walking in areas where ticks are possibly abundant will prevent their attachment because ticks tend to stay within inches of the ground. Second, using insecticides containing DEET can be applied on skin or clothing. Last, because it takes about 12 to 24 hours for the tick to transmit the disease after attachment to the human host, you should inspect your body for ticks twice a day when you are in areas where Lyme Disease is common. You must look thoroughly when searching for ticks. The Ixodes tick that carries Lyme Disease is very small. It is around the size of a sesame seed (picture of tick is not actual size). Removal is best done by grasping the tick by the head and pulling it out. Do not to squeeze the body of the tick. This could inject the infecting bacteria into the bite site. Wash the area of the bite with an antiseptic solution after removal.  Lyme Disease is a disease that may affect many body systems. Because of the small size of the biting tick, most people do not notice being bitten. The first sign of an infection is usually a round red rash that extends out from the center of the tick bite. The center of the lesion may be blood colored (hemorrhagic) or have tiny blisters (vesicular). Most lesions have bright red outer borders and partial central clearing. This rash may extend out many inches in diameter, and multiple lesions may  be present. Other symptoms such as fatigue, headaches, chills and fever, general achiness and swelling of lymph glands may also occur. If this first stage of the disease is left untreated, these symptoms may gradually resolve by themselves, or progressive symptoms may occur because of spread of infection to other areas of the body.  Follow up with your caregiver to have testing and treatment if you have a tick bite and you develop any of the above complaints. Your caregiver may recommend preventative (prophylactic) medications which kill bacteria (antibiotics). Once a diagnosis of Lyme Disease is made, antibiotic treatment is highly likely to cure the disease. Effective treatment of late stage Lyme Disease may require longer courses of antibiotic therapy.  MAKE SURE YOU:   Understand these instructions.  Will watch your condition.  Will get help right away if you are not doing well or get worse. Document Released: 02/18/2001 Document Revised: 02/04/2012 Document Reviewed: 04/22/2009 [employer] Patient Information 2015 [employer]. This information is not intended to replace advice given to you by your health care provider. Make sure you discuss any questions you have with your health care provider.  

## 2015-07-28 NOTE — Progress Notes (Signed)
BP 123/78 mmHg  Pulse 69  Temp(Src) 98.6 F (37 C) (Oral)  Ht 5\' 11"  (1.803 m)  Wt 218 lb 6.4 oz (99.066 kg)  BMI 30.47 kg/m2   Subjective:    Patient ID: [name], male    DOB: October 23, 1961, 54 y.o.   MRN: [id number]  HPI: [name] is a 54 y.o. male presenting on 07/28/2015 for Tick bites   HPI Tick bite/rash Patient presents today after having a rash that arose after tick bite. The tick was found to today's ago and the rash was found then as well. The rashes on his right posterior buttock and is shaped like a target. He also completed headache but denies any fevers or chills or joint pains or muscle aches. He has had multiple tick bites before but has never had a rash like this.  Relevant past medical, surgical, family and social history reviewed and updated as indicated. Interim medical history since our last visit reviewed. Allergies and medications reviewed and updated.  Review of Systems  Constitutional: Negative for fever and chills.  HENT: Negative for ear discharge and ear pain.   Eyes: Negative for discharge and visual disturbance.  Respiratory: Negative for shortness of breath and wheezing.   Cardiovascular: Negative for chest pain and leg swelling.  Gastrointestinal: Negative for abdominal pain, diarrhea and constipation.  Genitourinary: Negative for difficulty urinating.  Musculoskeletal: Negative for back pain and gait problem.  Skin: Positive for rash.  Neurological: Positive for headaches. Negative for syncope and light-headedness.  All other systems reviewed and are negative.   Per HPI unless specifically indicated above     Medication List       This list is accurate as of: 07/28/15  4:55 PM.  Always use your most recent med list.               aspirin 325 MG tablet  Take 325 mg by mouth daily.     atorvastatin 80 MG tablet  Commonly known as:  LIPITOR  Take 1 tablet (80 mg total) by mouth daily.     Choline Fenofibrate 135 MG capsule    Take 1 capsule (135 mg total) by mouth daily.     doxycycline 100 MG tablet  Commonly known as:  VIBRA-TABS  Take 1 tablet (100 mg total) by mouth 2 (two) times daily. 1 po bid     escitalopram 20 MG tablet  Commonly known as:  LEXAPRO  Take 1 tablet (20 mg total) by mouth daily.     Fish Oil 1000 MG Cpdr  Take 2,000 mg by mouth daily.     nebivolol 5 MG tablet  Commonly known as:  BYSTOLIC  Take 1 tablet (5 mg total) by mouth daily.     oxyCODONE 5 MG immediate release tablet  Commonly known as:  Oxy IR/ROXICODONE  Take 1 tablet (5 mg total) by mouth every 6 (six) hours as needed for moderate pain.     SUPER B COMPLEX PO  Take 1 tablet by mouth daily.     Vitamin D 2000 UNITS tablet  Take 2,000 Units by mouth daily.           Objective:    BP 123/78 mmHg  Pulse 69  Temp(Src) 98.6 F (37 C) (Oral)  Ht 5\' 11"  (1.803 m)  Wt 218 lb 6.4 oz (99.066 kg)  BMI 30.47 kg/m2  Wt Readings from Last 3 Encounters:  07/28/15 218 lb 6.4 oz (99.066 kg)  03/22/15  228 lb 2.8 oz (103.5 kg)  03/02/15 228 lb 2 oz (103.477 kg)    Physical Exam  Constitutional: He is oriented to person, place, and time. He appears well-developed and well-nourished. No distress.  Eyes: Conjunctivae and EOM are normal. Right eye exhibits no discharge. No scleral icterus.  Cardiovascular: Normal rate, regular rhythm, normal heart sounds and intact distal pulses.   No murmur heard. Pulmonary/Chest: Effort normal and breath sounds normal. No respiratory distress. He has no wheezes.  Musculoskeletal: Normal range of motion. He exhibits no edema.  Neurological: He is alert and oriented to person, place, and time. Coordination normal.  Skin: Skin is warm and dry. Rash (Circular, pink, raised rash with central clearing. The rashes on her posterior right buttocks and measures about 5 cm across in diameter.) noted. He is not diaphoretic.  Psychiatric: He has a normal mood and affect. His behavior is normal.   Vitals reviewed.   Results for orders placed or performed during the hospital encounter of 03/19/15  CBC with Differential  Result Value Ref Range   WBC 14.4 (H) 4.0 - 10.5 K/uL   RBC 4.92 4.22 - 5.81 MIL/uL   Hemoglobin 14.3 13.0 - 17.0 g/dL   HCT 42.7 39.0 - 52.0 %   MCV 86.8 78.0 - 100.0 fL   MCH 29.1 26.0 - 34.0 pg   MCHC 33.5 30.0 - 36.0 g/dL   RDW 12.9 11.5 - 15.5 %   Platelets 241 150 - 400 K/uL   Neutrophils Relative % 77 43 - 77 %   Neutro Abs 11.0 (H) 1.7 - 7.7 K/uL   Lymphocytes Relative 14 12 - 46 %   Lymphs Abs 2.1 0.7 - 4.0 K/uL   Monocytes Relative 9 3 - 12 %   Monocytes Absolute 1.2 (H) 0.1 - 1.0 K/uL   Eosinophils Relative 0 0 - 5 %   Eosinophils Absolute 0.1 0.0 - 0.7 K/uL   Basophils Relative 0 0 - 1 %   Basophils Absolute 0.0 0.0 - 0.1 K/uL  Comprehensive metabolic panel  Result Value Ref Range   Sodium 137 135 - 145 mmol/L   Potassium 3.4 (L) 3.5 - 5.1 mmol/L   Chloride 103 96 - 112 mmol/L   CO2 23 19 - 32 mmol/L   Glucose, Bld 117 (H) 70 - 99 mg/dL   BUN 16 6 - 23 mg/dL   Creatinine, Ser 0.79 0.50 - 1.35 mg/dL   Calcium 9.5 8.4 - 10.5 mg/dL   Total Protein 7.4 6.0 - 8.3 g/dL   Albumin 3.9 3.5 - 5.2 g/dL   AST 19 0 - 37 U/L   ALT 20 0 - 53 U/L   Alkaline Phosphatase 53 39 - 117 U/L   Total Bilirubin 0.6 0.3 - 1.2 mg/dL   GFR calc non Af Amer >90 >90 mL/min   GFR calc Af Amer >90 >90 mL/min   Anion gap 11 5 - 15  Lipase, blood  Result Value Ref Range   Lipase 116 (H) 11 - 59 U/L  Urinalysis, Routine w reflex microscopic  Result Value Ref Range   Color, Urine AMBER (A) YELLOW   APPearance CLOUDY (A) CLEAR   Specific Gravity, Urine 1.035 (H) 1.005 - 1.030   pH 5.5 5.0 - 8.0   Glucose, UA NEGATIVE NEGATIVE mg/dL   Hgb urine dipstick LARGE (A) NEGATIVE   Bilirubin Urine SMALL (A) NEGATIVE   Ketones, ur NEGATIVE NEGATIVE mg/dL   Protein, ur >300 (A) NEGATIVE mg/dL  Urobilinogen, UA 0.2 0.0 - 1.0 mg/dL   Nitrite NEGATIVE NEGATIVE    Leukocytes, UA NEGATIVE NEGATIVE  Urine microscopic-add on  Result Value Ref Range   WBC, UA 0-2 <3 WBC/hpf   RBC / HPF 3-6 <3 RBC/hpf   Urine-Other MUCOUS PRESENT   Glucose, capillary  Result Value Ref Range   Glucose-Capillary 115 (H) 70 - 99 mg/dL  Glucose, capillary  Result Value Ref Range   Glucose-Capillary 123 (H) 70 - 99 mg/dL  Basic metabolic panel  Result Value Ref Range   Sodium 134 (L) 135 - 145 mmol/L   Potassium 3.6 3.5 - 5.1 mmol/L   Chloride 101 96 - 112 mmol/L   CO2 25 19 - 32 mmol/L   Glucose, Bld 91 70 - 99 mg/dL   BUN 14 6 - 23 mg/dL   Creatinine, Ser 0.81 0.50 - 1.35 mg/dL   Calcium 8.7 8.4 - 10.5 mg/dL   GFR calc non Af Amer >90 >90 mL/min   GFR calc Af Amer >90 >90 mL/min   Anion gap 8 5 - 15  Lipase, blood  Result Value Ref Range   Lipase 39 11 - 59 U/L  CBC  Result Value Ref Range   WBC 7.5 4.0 - 10.5 K/uL   RBC 4.29 4.22 - 5.81 MIL/uL   Hemoglobin 12.2 (L) 13.0 - 17.0 g/dL   HCT 37.4 (L) 39.0 - 52.0 %   MCV 87.2 78.0 - 100.0 fL   MCH 28.4 26.0 - 34.0 pg   MCHC 32.6 30.0 - 36.0 g/dL   RDW 12.7 11.5 - 15.5 %   Platelets 212 150 - 400 K/uL  Basic metabolic panel  Result Value Ref Range   Sodium 140 135 - 145 mmol/L   Potassium 4.1 3.5 - 5.1 mmol/L   Chloride 107 96 - 112 mmol/L   CO2 26 19 - 32 mmol/L   Glucose, Bld 115 (H) 70 - 99 mg/dL   BUN 11 6 - 23 mg/dL   Creatinine, Ser 0.79 0.50 - 1.35 mg/dL   Calcium 9.3 8.4 - 10.5 mg/dL   GFR calc non Af Amer >90 >90 mL/min   GFR calc Af Amer >90 >90 mL/min   Anion gap 7 5 - 15  CBC  Result Value Ref Range   WBC 5.6 4.0 - 10.5 K/uL   RBC 4.23 4.22 - 5.81 MIL/uL   Hemoglobin 12.0 (L) 13.0 - 17.0 g/dL   HCT 36.3 (L) 39.0 - 52.0 %   MCV 85.8 78.0 - 100.0 fL   MCH 28.4 26.0 - 34.0 pg   MCHC 33.1 30.0 - 36.0 g/dL   RDW 12.5 11.5 - 15.5 %   Platelets 208 150 - 400 K/uL  Basic metabolic panel  Result Value Ref Range   Sodium 136 135 - 145 mmol/L   Potassium 3.8 3.5 - 5.1 mmol/L   Chloride  104 96 - 112 mmol/L   CO2 26 19 - 32 mmol/L   Glucose, Bld 99 70 - 99 mg/dL   BUN 10 6 - 23 mg/dL   Creatinine, Ser 0.83 0.50 - 1.35 mg/dL   Calcium 9.1 8.4 - 10.5 mg/dL   GFR calc non Af Amer >90 >90 mL/min   GFR calc Af Amer >90 >90 mL/min   Anion gap 6 5 - 15  I-stat troponin, ED (only if pt is 53 y.o. or older & pain is above umbilicus) - do not order at [facility]  Result Value Ref Range  Troponin i, poc 0.01 0.00 - 0.08 ng/mL   Comment 3              Assessment & Plan:   Problem List Items Addressed This Visit    None    Visit Diagnoses    Lyme disease, acute    -  Primary    Relevant Medications    doxycycline (VIBRA-TABS) 100 MG tablet        Follow up plan: Return if symptoms worsen or fail to improve.  Caryl Pina, MD [facility] 07/28/2015, 4:55 PM

## 2015-09-07 ENCOUNTER — Ambulatory Visit (INDEPENDENT_AMBULATORY_CARE_PROVIDER_SITE_OTHER): Payer: 59

## 2015-09-07 DIAGNOSIS — Z23 Encounter for immunization: Secondary | ICD-10-CM | POA: Diagnosis not present

## 2016-01-16 MED FILL — ATORVASTATIN 80 MG TABLET: 80 | 90 days supply | Qty: 90 | Fill #2

## 2016-01-16 MED FILL — FENOFIBRIC ACID DR 135 MG C: 135 | 90 days supply | Qty: 90 | Fill #2

## 2016-01-17 DIAGNOSIS — Q453 Other congenital malformations of pancreas and pancreatic duct: Secondary | ICD-10-CM | POA: Diagnosis not present

## 2016-01-17 DIAGNOSIS — K861 Other chronic pancreatitis: Secondary | ICD-10-CM | POA: Insufficient documentation

## 2016-01-20 DIAGNOSIS — M545 Low back pain: Secondary | ICD-10-CM | POA: Diagnosis not present

## 2016-01-20 DIAGNOSIS — M47896 Other spondylosis, lumbar region: Secondary | ICD-10-CM | POA: Diagnosis not present

## 2016-01-20 DIAGNOSIS — Z79899 Other long term (current) drug therapy: Secondary | ICD-10-CM | POA: Diagnosis not present

## 2016-01-23 DIAGNOSIS — G894 Chronic pain syndrome: Secondary | ICD-10-CM | POA: Diagnosis not present

## 2016-01-23 DIAGNOSIS — Z79891 Long term (current) use of opiate analgesic: Secondary | ICD-10-CM | POA: Diagnosis not present

## 2016-01-30 MED FILL — ESCITALOPRAM 20 MG TABLET: 20 | 90 days supply | Qty: 90 | Fill #2

## 2016-01-31 DIAGNOSIS — K861 Other chronic pancreatitis: Secondary | ICD-10-CM | POA: Diagnosis not present

## 2016-01-31 DIAGNOSIS — K8689 Other specified diseases of pancreas: Secondary | ICD-10-CM | POA: Diagnosis not present

## 2016-01-31 DIAGNOSIS — Q453 Other congenital malformations of pancreas and pancreatic duct: Secondary | ICD-10-CM | POA: Diagnosis not present

## 2016-02-09 ENCOUNTER — Other Ambulatory Visit: Payer: Self-pay

## 2016-02-09 DIAGNOSIS — Z1211 Encounter for screening for malignant neoplasm of colon: Secondary | ICD-10-CM

## 2016-02-24 ENCOUNTER — Encounter: Payer: Self-pay | Admitting: Internal Medicine

## 2016-03-14 DIAGNOSIS — H5203 Hypermetropia, bilateral: Secondary | ICD-10-CM | POA: Diagnosis not present

## 2016-04-17 ENCOUNTER — Ambulatory Visit (AMBULATORY_SURGERY_CENTER): Payer: Self-pay | Admitting: *Deleted

## 2016-04-17 VITALS — Ht 71.0 in | Wt 231.0 lb

## 2016-04-17 DIAGNOSIS — Z1211 Encounter for screening for malignant neoplasm of colon: Secondary | ICD-10-CM

## 2016-04-17 MED ORDER — NA SULFATE-K SULFATE-MG SULF 17.5-3.13-1.6 GM/177ML PO SOLN: 1.0000 | Freq: Once | ORAL | Status: DC

## 2016-04-17 NOTE — Progress Notes (Signed)
No egg or soy allergy known to patient  No issues with past sedation with any surgeries  or procedures, no intubation problems  No diet pills per patient No home 02 use per patient  No blood thinners per patient  Pt denies issues with constipation  Pt unsure if mother had polyps  emmi video to e mail

## 2016-04-24 MED FILL — BYSTOLIC 5 MG TABLET: 5 | 90 days supply | Qty: 90 | Fill #2

## 2016-04-24 MED FILL — ATORVASTATIN 80 MG TABLET: 80 | 90 days supply | Qty: 90 | Fill #3

## 2016-04-24 MED FILL — FENOFIBRIC ACID DR 135 MG C: 135 | 90 days supply | Qty: 90 | Fill #3

## 2016-05-01 ENCOUNTER — Encounter: Payer: Self-pay | Admitting: Internal Medicine

## 2016-05-17 ENCOUNTER — Telehealth: Payer: Self-pay | Admitting: Internal Medicine

## 2016-05-17 ENCOUNTER — Encounter: Payer: Self-pay | Admitting: Internal Medicine

## 2016-05-18 NOTE — Telephone Encounter (Signed)
I have spoken to patient's wife, Jan (per patient request) who I have advised regarding Miralax prep in place of Suprep. I have given her specific instructions and she has verbalized them back to me.

## 2016-05-18 NOTE — Telephone Encounter (Signed)
See patient Email from 05/17/16.

## 2016-05-21 MED FILL — ESCITALOPRAM 20 MG TABLET: 20 | 90 days supply | Qty: 90 | Fill #3

## 2016-05-22 ENCOUNTER — Encounter: Payer: Self-pay | Admitting: Internal Medicine

## 2016-05-22 ENCOUNTER — Ambulatory Visit (AMBULATORY_SURGERY_CENTER): Payer: 59 | Admitting: Internal Medicine

## 2016-05-22 VITALS — BP 113/70 | HR 56 | Temp 97.8°F | Resp 9 | Ht 71.0 in | Wt 231.0 lb

## 2016-05-22 DIAGNOSIS — Z1211 Encounter for screening for malignant neoplasm of colon: Secondary | ICD-10-CM | POA: Diagnosis present

## 2016-05-22 DIAGNOSIS — D122 Benign neoplasm of ascending colon: Secondary | ICD-10-CM

## 2016-05-22 DIAGNOSIS — K635 Polyp of colon: Secondary | ICD-10-CM

## 2016-05-22 DIAGNOSIS — I10 Essential (primary) hypertension: Secondary | ICD-10-CM | POA: Diagnosis not present

## 2016-05-22 MED ORDER — SODIUM CHLORIDE 0.9 % IV SOLN: 500.0000 mL | INTRAVENOUS | Status: DC

## 2016-05-22 NOTE — Progress Notes (Signed)
Report to PACU, RN, vss, BBS= Clear.  

## 2016-05-22 NOTE — Op Note (Signed)
[facility] Patient Name: [name] Procedure Date: 05/22/2016 9:02 AM MRN: [id number] Endoscopist: Jerene Bears , MD Age: 55 Referring MD:  Date of Birth: 25-Sep-1961 Gender: Male Account #: [id number] Procedure:                Colonoscopy Indications:              Screening for colorectal malignant neoplasm, This                            is the patient's first colonoscopy Medicines:                Monitored Anesthesia Care Procedure:                Pre-Anesthesia Assessment:                           - Prior to the procedure, a History and Physical                            was performed, and patient medications and                            allergies were reviewed. The patient's tolerance of                            previous anesthesia was also reviewed. The risks                            and benefits of the procedure and the sedation                            options and risks were discussed with the patient.                            All questions were answered, and informed consent                            was obtained. Prior Anticoagulants: The patient has                            taken no previous anticoagulant or antiplatelet                            agents. ASA Grade Assessment: II - A patient with                            mild systemic disease. After reviewing the risks                            and benefits, the patient was deemed in                            satisfactory condition to undergo the procedure.  After obtaining informed consent, the colonoscope                            was passed under direct vision. Throughout the                            procedure, the patient's blood pressure, pulse, and                            oxygen saturations were monitored continuously. The                            Model CF-HQ190L [id number]) scope was introduced                            through the anus and advanced  to the the cecum,                            identified by appendiceal orifice and ileocecal                            valve. The colonoscopy was performed without                            difficulty. The patient tolerated the procedure                            well. The quality of the bowel preparation was                            good. The ileocecal valve, appendiceal orifice, and                            rectum were photographed. Scope In: 9:13:56 AM Scope Out: 9:30:23 AM Scope Withdrawal Time: 0 hours 11 minutes 42 seconds  Total Procedure Duration: 0 hours 16 minutes 27 seconds  Findings:                 The digital rectal exam was normal.                           A 4 mm polyp was found in the ascending colon. The                            polyp was sessile. The polyp was removed with a                            cold snare. Resection and retrieval were complete.                           A few small-mouthed diverticula were found in the                            sigmoid colon and descending colon.  Internal hemorrhoids were found during                            retroflexion. The hemorrhoids were small. Complications:            No immediate complications. Estimated Blood Loss:     Estimated blood loss: none. Impression:               - One 4 mm polyp in the ascending colon, removed                            with a cold snare. Resected and retrieved.                           - Diverticulosis in the sigmoid colon and in the                            descending colon.                           - Internal hemorrhoids. Recommendation:           - Patient has a contact number available for                            emergencies. The signs and symptoms of potential                            delayed complications were discussed with the                            patient. Return to normal activities tomorrow.                            Written  discharge instructions were provided to the                            patient.                           - Resume previous diet.                           - Continue present medications.                           - Await pathology results.                           - Repeat colonoscopy is recommended. The                            colonoscopy date will be determined after pathology                            results from today's exam become available for  review. Jerene Bears, MD 05/22/2016 9:33:12 AM This report has been signed electronically.

## 2016-05-22 NOTE — Patient Instructions (Signed)
YOU HAD AN ENDOSCOPIC PROCEDURE TODAY AT [facility]:   Refer to the procedure report that was given to you for any specific questions about what was found during the examination.  If the procedure report does not answer your questions, please call your gastroenterologist to clarify.  If you requested that your care partner not be given the details of your procedure findings, then the procedure report has been included in a sealed envelope for you to review at your convenience later.  YOU SHOULD EXPECT: Some feelings of bloating in the abdomen. Passage of more gas than usual.  Walking can help get rid of the air that was put into your GI tract during the procedure and reduce the bloating. If you had a lower endoscopy (such as a colonoscopy or flexible sigmoidoscopy) you may notice spotting of blood in your stool or on the toilet paper. If you underwent a bowel prep for your procedure, you may not have a normal bowel movement for a few days.  Please Note:  You might notice some irritation and congestion in your nose or some drainage.  This is from the oxygen used during your procedure.  There is no need for concern and it should clear up in a day or so.  SYMPTOMS TO REPORT IMMEDIATELY:   Following lower endoscopy (colonoscopy or flexible sigmoidoscopy):  Excessive amounts of blood in the stool  Significant tenderness or worsening of abdominal pains  Swelling of the abdomen that is new, acute  Fever of 100F or higher   For urgent or emergent issues, a gastroenterologist can be reached at any hour by calling [phone].   DIET: Your first meal following the procedure should be a small meal and then it is ok to progress to your normal diet. Heavy or fried foods are harder to digest and may make you feel nauseous or bloated.  Likewise, meals heavy in dairy and vegetables can increase bloating.  Drink plenty of fluids but you should avoid alcoholic beverages for 24  hours.  ACTIVITY:  You should plan to take it easy for the rest of today and you should NOT DRIVE or use heavy machinery until tomorrow (because of the sedation medicines used during the test).    FOLLOW UP: Our staff will call the number listed on your records the next business day following your procedure to check on you and address any questions or concerns that you may have regarding the information given to you following your procedure. If we do not reach you, we will leave a message.  However, if you are feeling well and you are not experiencing any problems, there is no need to return our call.  We will assume that you have returned to your regular daily activities without incident.  If any biopsies were taken you will be contacted by phone or by letter within the next 1-3 weeks.  Please call us at [phone] if you have not heard about the biopsies in 3 weeks.    SIGNATURES/CONFIDENTIALITY: You and/or your care partner have signed paperwork which will be entered into your electronic medical record.  These signatures attest to the fact that that the information above on your After Visit Summary has been reviewed and is understood.  Full responsibility of the confidentiality of this discharge information lies with you and/or your care-partner.  Polyps, diverticulosis, hemorrhoids, handouts provided. Await results of pathology to determine next colonoscopy.

## 2016-05-22 NOTE — Progress Notes (Signed)
Called to room to assist during endoscopic procedure.  Patient ID and intended procedure confirmed with present staff. Received instructions for my participation in the procedure from the performing physician.  

## 2016-05-23 ENCOUNTER — Telehealth: Payer: Self-pay

## 2016-05-23 NOTE — Telephone Encounter (Signed)
  Follow up Call-  Call back number 05/22/2016  Post procedure Call Back phone  # [phone]  Permission to leave phone message Yes    Patient was called for follow up after his procedure on 05/22/2016. No answer at the number given for follow up phone call. A message was left on the answering machine.

## 2016-05-29 ENCOUNTER — Encounter: Payer: Self-pay | Admitting: Internal Medicine

## 2016-07-31 ENCOUNTER — Other Ambulatory Visit: Payer: Self-pay

## 2016-07-31 MED ORDER — ATORVASTATIN CALCIUM 80 MG PO TABS: 80.0000 mg | ORAL_TABLET | Freq: Every day | ORAL | 0 refills | Status: DC

## 2016-07-31 MED ORDER — CHOLINE FENOFIBRATE 135 MG PO CPDR: 135.0000 mg | DELAYED_RELEASE_CAPSULE | Freq: Every day | ORAL | 0 refills | Status: DC

## 2016-07-31 MED FILL — FENOFIBRIC ACID DR 135 MG C: 135 | 90 days supply | Qty: 90 | Fill #0

## 2016-07-31 MED FILL — ATORVASTATIN 80 MG TABLET: 80 | 90 days supply | Qty: 90 | Fill #0

## 2016-08-24 ENCOUNTER — Ambulatory Visit (INDEPENDENT_AMBULATORY_CARE_PROVIDER_SITE_OTHER): Payer: 59 | Admitting: Physician Assistant

## 2016-08-24 ENCOUNTER — Encounter: Payer: Self-pay | Admitting: Physician Assistant

## 2016-08-24 VITALS — BP 131/85 | HR 62 | Temp 97.6°F | Ht 71.0 in | Wt 224.6 lb

## 2016-08-24 DIAGNOSIS — Z125 Encounter for screening for malignant neoplasm of prostate: Secondary | ICD-10-CM

## 2016-08-24 DIAGNOSIS — Z6831 Body mass index (BMI) 31.0-31.9, adult: Secondary | ICD-10-CM

## 2016-08-24 DIAGNOSIS — E119 Type 2 diabetes mellitus without complications: Secondary | ICD-10-CM

## 2016-08-24 DIAGNOSIS — I10 Essential (primary) hypertension: Secondary | ICD-10-CM | POA: Diagnosis not present

## 2016-08-24 DIAGNOSIS — E785 Hyperlipidemia, unspecified: Secondary | ICD-10-CM | POA: Diagnosis not present

## 2016-08-24 DIAGNOSIS — IMO0001 Reserved for inherently not codable concepts without codable children: Secondary | ICD-10-CM

## 2016-08-24 DIAGNOSIS — M5136 Other intervertebral disc degeneration, lumbar region: Secondary | ICD-10-CM

## 2016-08-24 DIAGNOSIS — F419 Anxiety disorder, unspecified: Secondary | ICD-10-CM

## 2016-08-24 DIAGNOSIS — R351 Nocturia: Secondary | ICD-10-CM

## 2016-08-24 DIAGNOSIS — Z0000 Encounter for general adult medical examination without abnormal findings: Secondary | ICD-10-CM | POA: Diagnosis not present

## 2016-08-24 MED ORDER — HYDROCODONE-ACETAMINOPHEN 5-325 MG PO TABS: 1.0000 | ORAL_TABLET | Freq: Three times a day (TID) | ORAL | 0 refills | Status: DC | PRN

## 2016-08-24 MED ORDER — CHOLINE FENOFIBRATE 135 MG PO CPDR
135.0000 mg | DELAYED_RELEASE_CAPSULE | Freq: Every day | ORAL | 0 refills | Status: DC
Start: 2016-08-24 — End: 2016-11-12

## 2016-08-24 MED ORDER — NEBIVOLOL HCL 5 MG PO TABS: 5.0000 mg | ORAL_TABLET | Freq: Every day | ORAL | 1 refills | Status: DC

## 2016-08-24 MED ORDER — ESCITALOPRAM OXALATE 20 MG PO TABS: 20.0000 mg | ORAL_TABLET | Freq: Every day | ORAL | 1 refills | Status: DC

## 2016-08-24 MED ORDER — ATORVASTATIN CALCIUM 80 MG PO TABS: 80.0000 mg | ORAL_TABLET | Freq: Every day | ORAL | 0 refills | Status: DC

## 2016-08-24 MED FILL — ESCITALOPRAM 20 MG TABLET: 20 | 90 days supply | Qty: 90 | Fill #0

## 2016-08-24 MED FILL — BYSTOLIC 5 MG TABLET: 5 | 90 days supply | Qty: 90 | Fill #0

## 2016-08-24 NOTE — Patient Instructions (Addendum)
Sciatica Sciatica is pain, weakness, numbness, or tingling along the path of the sciatic nerve. The nerve starts in the lower back and runs down the back of each leg. The nerve controls the muscles in the lower leg and in the back of the knee, while also providing sensation to the back of the thigh, lower leg, and the sole of your foot. Sciatica is a symptom of another medical condition. For instance, nerve damage or certain conditions, such as a herniated disk or bone spur on the spine, pinch or put pressure on the sciatic nerve. This causes the pain, weakness, or other sensations normally associated with sciatica. Generally, sciatica only affects one side of the body. CAUSES   Herniated or slipped disc.  Degenerative disk disease.  A pain disorder involving the narrow muscle in the buttocks (piriformis syndrome).  Pelvic injury or fracture.  Pregnancy.  Tumor (rare). SYMPTOMS  Symptoms can vary from mild to very severe. The symptoms usually travel from the low back to the buttocks and down the back of the leg. Symptoms can include:  Mild tingling or dull aches in the lower back, leg, or hip.  Numbness in the back of the calf or sole of the foot.  Burning sensations in the lower back, leg, or hip.  Sharp pains in the lower back, leg, or hip.  Leg weakness.  Severe back pain inhibiting movement. These symptoms may get worse with coughing, sneezing, laughing, or prolonged sitting or standing. Also, being overweight may worsen symptoms. DIAGNOSIS  Your caregiver will perform a physical exam to look for common symptoms of sciatica. He or she may ask you to do certain movements or activities that would trigger sciatic nerve pain. Other tests may be performed to find the cause of the sciatica. These may include:  Blood tests.  X-rays.  Imaging tests, such as an MRI or CT scan. TREATMENT  Treatment is directed at the cause of the sciatic pain. Sometimes, treatment is not necessary  and the pain and discomfort goes away on its own. If treatment is needed, your caregiver may suggest:  Over-the-counter medicines to relieve pain.  Prescription medicines, such as anti-inflammatory medicine, muscle relaxants, or narcotics.  Applying heat or ice to the painful area.  Steroid injections to lessen pain, irritation, and inflammation around the nerve.  Reducing activity during periods of pain.  Exercising and stretching to strengthen your abdomen and improve flexibility of your spine. Your caregiver may suggest losing weight if the extra weight makes the back pain worse.  Physical therapy.  Surgery to eliminate what is pressing or pinching the nerve, such as a bone spur or part of a herniated disk. HOME CARE INSTRUCTIONS   Only take over-the-counter or prescription medicines for pain or discomfort as directed by your caregiver.  Apply ice to the affected area for 20 minutes, 3-4 times a day for the first 48-72 hours. Then try heat in the same way.  Exercise, stretch, or perform your usual activities if these do not aggravate your pain.  Attend physical therapy sessions as directed by your caregiver.  Keep all follow-up appointments as directed by your caregiver.  Do not wear high heels or shoes that do not provide proper support.  Check your mattress to see if it is too soft. A firm mattress may lessen your pain and discomfort. SEEK IMMEDIATE MEDICAL CARE IF:   You lose control of your bowel or bladder (incontinence).  You have increasing weakness in the lower back, pelvis, buttocks,   or legs.  You have redness or swelling of your back.  You have a burning sensation when you urinate.  You have pain that gets worse when you lie down or awakens you at night.  Your pain is worse than you have experienced in the past.  Your pain is lasting longer than 4 weeks.  You are suddenly losing weight without reason. MAKE SURE YOU:  Understand these  instructions.  Will watch your condition.  Will get help right away if you are not doing well or get worse.   This information is not intended to replace advice given to you by your health care provider. Make sure you discuss any questions you have with your health care provider.   Document Released: 11/06/2001 Document Revised: 08/03/2015 Document Reviewed: 03/23/2012 [employer] Interactive Patient Education 2016 [employer] Back pain

## 2016-08-24 NOTE — Progress Notes (Signed)
BP 131/85   Pulse 62   Temp 97.6 F (36.4 C) (Oral)   Ht '5\' 11"'$  (1.803 m)   Wt 224 lb 9.6 oz (101.9 kg)   BMI 31.33 kg/m    Subjective:    Patient ID: [name], male    DOB: [birth year], 55 y.o.   MRN: [id number]  HPI: [name] is a 55 y.o. male presenting on 08/24/2016 for complete physical Here for a Complete physical and recheck on conditions and medications.  He has been feeling well overall but will Occasionally have a flareup of his lumbar spine issues. He had such a severe degenerative disc issue that he had a spinal fusion performed several years ago. He is on disability for this. He really tries to watch what activities he does. He is having very little issues with hyperglycemia. He is been well controlled with diet for any of his diabetic issues. We will draw an A1c today. He does have some nocturia and we can draw PSA to evaluate for that. All other labs will be ordered today.  Relevant past medical, surgical, family and social history reviewed and updated as indicated. Interim medical history since our last visit reviewed. Allergies and medications reviewed and updated. DATA REVIEWED: CHART IN EPIC  Social History   Social History  . Marital status: Married    Spouse name: N/A  . Number of children: 2  . Years of education: N/A   Occupational History  . Not on file.   Social History Main Topics  . Smoking status: Former Smoker    Packs/day: 1.50    Years: 35.00    Types: Cigarettes    Quit date: 07/27/2013  . Smokeless tobacco: Former Systems developer    Types: Snuff    Quit date: 07/27/2013  . Alcohol use 0.0 oz/week     Comment: 08/11/2014 "I drink a few times/year'  . Drug use: No  . Sexual activity: No   Other Topics Concern  . Not on file   Social History Narrative   ** Merged History Encounter **        Past Surgical History:  Procedure Laterality Date  . CARDIAC CATHETERIZATION  2007  . ENTEROSCOPY N/A 03/20/2015   Procedure: ENTEROSCOPY;   Surgeon: Jerene Bears, MD;  Location: [facility] ENDOSCOPY;  Service: Endoscopy;  Laterality: N/A;  . LEFT HEART CATHETERIZATION WITH CORONARY ANGIOGRAM N/A 08/12/2014   Procedure: LEFT HEART CATHETERIZATION WITH CORONARY ANGIOGRAM;  Surgeon: Burnell Blanks, MD;  Location: [facility] CATH LAB;  Service: Cardiovascular;  Laterality: N/A;  . POSTERIOR LUMBAR FUSION  2010?  . TONSILLECTOMY  1979    Family History  Problem Relation Age of Onset  . Heart attack Father     father died of heart attach in his 81s  . Hyperlipidemia Father   . Early death Father   . Lung cancer Mother   . Hyperlipidemia Mother   . Hyperlipidemia Brother   . Alzheimer's disease Maternal Grandmother   . Colon cancer Neg Hx   . Colon polyps Neg Hx   . Esophageal cancer Neg Hx   . Rectal cancer Neg Hx   . Stomach cancer Neg Hx     Review of Systems  Constitutional: Negative.  Negative for appetite change and fatigue.  HENT: Negative.   Eyes: Negative.  Negative for pain and visual disturbance.  Respiratory: Negative.  Negative for cough, chest tightness, shortness of breath and wheezing.   Cardiovascular: Negative.  Negative for chest  pain, palpitations and leg swelling.  Gastrointestinal: Negative.  Negative for abdominal pain, diarrhea, nausea and vomiting.  Endocrine: Negative.   Genitourinary: Negative.   Musculoskeletal: Positive for back pain.  Skin: Negative.  Negative for color change and rash.  Neurological: Negative.  Negative for weakness, numbness and headaches.  Psychiatric/Behavioral: The patient is nervous/anxious.       Medication List       Accurate as of 08/24/16  3:16 PM. Always use your most recent med list.          ALPRAZolam 0.25 MG tablet Commonly known as:  XANAX Take 0.25 mg by mouth at bedtime as needed for anxiety.   aspirin 325 MG tablet Take 325 mg by mouth daily.   atorvastatin 80 MG tablet Commonly known as:  LIPITOR Take 1 tablet (80 mg total) by mouth daily.     Choline Fenofibrate 135 MG capsule Take 1 capsule (135 mg total) by mouth daily.   escitalopram 20 MG tablet Commonly known as:  LEXAPRO Take 1 tablet (20 mg total) by mouth daily.   Fish Oil 1000 MG Cpdr Take 2,000 mg by mouth daily.   HYDROcodone-acetaminophen 5-325 MG tablet Commonly known as:  NORCO/VICODIN Take 1 tablet by mouth every 8 (eight) hours as needed for moderate pain.   nebivolol 5 MG tablet Commonly known as:  BYSTOLIC Take 1 tablet (5 mg total) by mouth daily.   SUPER B COMPLEX PO Take 1 tablet by mouth daily.   Vitamin D 2000 units tablet Take 2,000 Units by mouth daily.          Objective:    BP 131/85   Pulse 62   Temp 97.6 F (36.4 C) (Oral)   Ht '5\' 11"'$  (1.803 m)   Wt 224 lb 9.6 oz (101.9 kg)   BMI 31.33 kg/m   No Known Allergies  Wt Readings from Last 3 Encounters:  08/24/16 224 lb 9.6 oz (101.9 kg)  05/22/16 231 lb (104.8 kg)  04/17/16 231 lb (104.8 kg)    Physical Exam  Constitutional: He appears well-developed and well-nourished.  HENT:  Head: Normocephalic and atraumatic.  Eyes: Conjunctivae and EOM are normal. Pupils are equal, round, and reactive to light.  Neck: Normal range of motion. Neck supple.  Cardiovascular: Normal rate, regular rhythm and normal heart sounds.   Pulmonary/Chest: Effort normal and breath sounds normal.  Abdominal: Soft. Bowel sounds are normal.  Musculoskeletal: Normal range of motion.       Lumbar back: He exhibits tenderness, deformity and spasm.  Skin: Skin is warm and dry.  Nursing note and vitals reviewed.       Assessment & Plan:   1. Well adult - CMP14+EGFR - CBC with Differential/Platelet - TSH - Lipid panel - PSA.   2. Type 2 diabetes mellitus without complication, without long-term current use of insulin (HCC) - CMP14+EGFR - CBC with Differential/Platelet - TSH - Hemoglobin A1c  3. Benign essential HTN - nebivolol (BYSTOLIC) 5 MG tablet; Take 1 tablet (5 mg total) by mouth  daily.  Dispense: 90 tablet; Refill: 1 - CMP14+EGFR - CBC with Differential/Platelet  4. Anxiety - escitalopram (LEXAPRO) 20 MG tablet; Take 1 tablet (20 mg total) by mouth daily.  Dispense: 90 tablet; Refill: 1  5. DDD (degenerative disc disease), lumbar - HYDROcodone-acetaminophen (NORCO/VICODIN) 5-325 MG tablet; Take 1 tablet by mouth every 8 (eight) hours as needed for moderate pain.  Dispense: 90 tablet; Refill: 0  6. Hyperlipidemia - Choline Fenofibrate 135 MG  capsule; Take 1 capsule (135 mg total) by mouth daily.  Dispense: 90 capsule; Refill: 0 - atorvastatin (LIPITOR) 80 MG tablet; Take 1 tablet (80 mg total) by mouth daily.  Dispense: 90 tablet; Refill: 0 - CMP14+EGFR - Lipid panel  7. Screening for prostate cancer - PSA  8. Nocturia - PSA  9. Body mass index 31.0-31.9, adult   Continue all other maintenance medications as listed above.  Follow up plan: Return in about 6 months (around 02/21/2017).   Orders Placed This Encounter  Procedures  . CMP14+EGFR  . CBC with Differential/Platelet  . TSH  . Lipid panel  . PSA  . Hemoglobin A1c    Educational handout given for back pain   Terald Sleeper PA-C [facility] [address]  [place],  [address] [phone]   08/24/2016, 3:16 PM

## 2016-08-25 LAB — CMP14+EGFR
A/G RATIO: 1.8 (ref 1.2–2.2)
ALK PHOS: 54 IU/L (ref 39–117)
ALT: 37 IU/L (ref 0–44)
AST: 38 IU/L (ref 0–40)
Albumin: 4.3 g/dL (ref 3.5–5.5)
BILIRUBIN TOTAL: 0.3 mg/dL (ref 0.0–1.2)
BUN/Creatinine Ratio: 15 (ref 9–20)
BUN: 12 mg/dL (ref 6–24)
CO2: 22 mmol/L (ref 18–29)
Calcium: 9.1 mg/dL (ref 8.7–10.2)
Chloride: 104 mmol/L (ref 96–106)
Creatinine, Ser: 0.78 mg/dL (ref 0.76–1.27)
GFR calc non Af Amer: 102 mL/min/{1.73_m2} (ref >59)
GFR, EST AFRICAN AMERICAN: 118 mL/min/{1.73_m2} (ref >59)
GLUCOSE: 103 mg/dL — AB (ref 65–99)
Globulin, Total: 2.4 g/dL (ref 1.5–4.5)
POTASSIUM: 4.3 mmol/L (ref 3.5–5.2)
Sodium: 140 mmol/L (ref 134–144)
TOTAL PROTEIN: 6.7 g/dL (ref 6.0–8.5)

## 2016-08-25 LAB — TSH: TSH: 0.75 u[IU]/mL (ref 0.450–4.500)

## 2016-08-25 LAB — LIPID PANEL
Chol/HDL Ratio: 5.5 ratio units — ABNORMAL HIGH (ref 0.0–5.0)
Cholesterol, Total: 192 mg/dL (ref 100–199)
HDL: 35 mg/dL — AB (ref >39)
LDL CALC: 131 mg/dL — AB (ref 0–99)
Triglycerides: 129 mg/dL (ref 0–149)
VLDL CHOLESTEROL CAL: 26 mg/dL (ref 5–40)

## 2016-08-25 LAB — CBC WITH DIFFERENTIAL/PLATELET
BASOS: 0 %
Basophils Absolute: 0 10*3/uL (ref 0.0–0.2)
EOS (ABSOLUTE): 0.2 10*3/uL (ref 0.0–0.4)
Eos: 3 %
HEMATOCRIT: 43.4 % (ref 37.5–51.0)
HEMOGLOBIN: 14.6 g/dL (ref 12.6–17.7)
Immature Grans (Abs): 0 10*3/uL (ref 0.0–0.1)
Immature Granulocytes: 0 %
Lymphocytes Absolute: 2.1 10*3/uL (ref 0.7–3.1)
Lymphs: 30 %
MCH: 29.7 pg (ref 26.6–33.0)
MCHC: 33.6 g/dL (ref 31.5–35.7)
MCV: 88 fL (ref 79–97)
Monocytes Absolute: 0.5 10*3/uL (ref 0.1–0.9)
Monocytes: 8 %
Neutrophils Absolute: 4 10*3/uL (ref 1.4–7.0)
Neutrophils: 59 %
Platelets: 228 10*3/uL (ref 150–379)
RBC: 4.91 x10E6/uL (ref 4.14–5.80)
RDW: 13.1 % (ref 12.3–15.4)
WBC: 6.9 10*3/uL (ref 3.4–10.8)

## 2016-08-25 LAB — PSA: Prostate Specific Ag, Serum: 0.6 ng/mL (ref 0.0–4.0)

## 2016-08-25 LAB — HEMOGLOBIN A1C
Est. average glucose Bld gHb Est-mCnc: 123 mg/dL
Hgb A1c MFr Bld: 5.9 % — ABNORMAL HIGH (ref 4.8–5.6)

## 2016-09-05 ENCOUNTER — Ambulatory Visit (INDEPENDENT_AMBULATORY_CARE_PROVIDER_SITE_OTHER): Payer: Medicare Other

## 2016-09-05 DIAGNOSIS — Z23 Encounter for immunization: Secondary | ICD-10-CM

## 2016-09-07 ENCOUNTER — Ambulatory Visit: Payer: Medicare Other

## 2016-11-12 ENCOUNTER — Other Ambulatory Visit: Payer: Self-pay | Admitting: *Deleted

## 2016-11-12 MED ORDER — ATORVASTATIN CALCIUM 80 MG PO TABS: 80.0000 mg | ORAL_TABLET | Freq: Every day | ORAL | 1 refills | Status: DC

## 2016-11-12 MED ORDER — CHOLINE FENOFIBRATE 135 MG PO CPDR: 135.0000 mg | DELAYED_RELEASE_CAPSULE | Freq: Every day | ORAL | 1 refills | Status: DC

## 2016-11-12 MED FILL — ATORVASTATIN 80 MG TABLET: 80 | 90 days supply | Qty: 90 | Fill #0

## 2016-11-12 MED FILL — FENOFIBRIC ACID DR 135 MG C: 135 | 90 days supply | Qty: 90 | Fill #0

## 2016-11-20 MED FILL — ESCITALOPRAM 20 MG TABLET: 20 | 90 days supply | Qty: 90 | Fill #1

## 2017-02-05 DIAGNOSIS — K861 Other chronic pancreatitis: Secondary | ICD-10-CM | POA: Diagnosis not present

## 2017-02-22 ENCOUNTER — Encounter: Payer: Self-pay | Admitting: Family Medicine

## 2017-02-22 DIAGNOSIS — Z0100 Encounter for examination of eyes and vision without abnormal findings: Secondary | ICD-10-CM | POA: Diagnosis not present

## 2017-02-22 LAB — HM DIABETES EYE EXAM

## 2017-02-25 ENCOUNTER — Other Ambulatory Visit: Payer: Self-pay | Admitting: Physician Assistant

## 2017-02-25 DIAGNOSIS — F419 Anxiety disorder, unspecified: Secondary | ICD-10-CM

## 2017-02-25 MED FILL — FENOFIBRIC ACID DR 135 MG C: 135 | 90 days supply | Qty: 90 | Fill #1

## 2017-02-25 MED FILL — BYSTOLIC 5 MG TABLET: 5 | 90 days supply | Qty: 90 | Fill #1

## 2017-02-25 MED FILL — ATORVASTATIN 80 MG TABLET: 80 | 90 days supply | Qty: 90 | Fill #1

## 2017-02-26 MED FILL — ESCITALOPRAM 20 MG TABLET: 20 | 90 days supply | Qty: 90 | Fill #0

## 2017-04-02 ENCOUNTER — Encounter: Payer: Self-pay | Admitting: Physician Assistant

## 2017-04-02 ENCOUNTER — Ambulatory Visit (INDEPENDENT_AMBULATORY_CARE_PROVIDER_SITE_OTHER): Payer: 59 | Admitting: Physician Assistant

## 2017-04-02 VITALS — BP 128/81 | HR 62 | Temp 98.2°F | Ht 71.0 in | Wt 220.4 lb

## 2017-04-02 DIAGNOSIS — E785 Hyperlipidemia, unspecified: Secondary | ICD-10-CM | POA: Diagnosis not present

## 2017-04-02 DIAGNOSIS — E1165 Type 2 diabetes mellitus with hyperglycemia: Secondary | ICD-10-CM | POA: Diagnosis not present

## 2017-04-02 DIAGNOSIS — I10 Essential (primary) hypertension: Secondary | ICD-10-CM | POA: Diagnosis not present

## 2017-04-02 DIAGNOSIS — M5136 Other intervertebral disc degeneration, lumbar region: Secondary | ICD-10-CM

## 2017-04-02 LAB — BAYER DCA HB A1C WAIVED: HB A1C (BAYER DCA - WAIVED): 5.5 % (ref <7.0)

## 2017-04-02 MED ORDER — HYDROCODONE-ACETAMINOPHEN 5-325 MG PO TABS: 1.0000 | ORAL_TABLET | Freq: Three times a day (TID) | ORAL | 0 refills | Status: DC | PRN

## 2017-04-02 NOTE — Patient Instructions (Signed)
In a few days you may receive a survey in the mail or online from [employer] regarding your visit with us today. Please take a moment to fill this out. Your feedback is very important to our whole office. It can help us better understand your needs as well as improve your experience and satisfaction. Thank you for taking your time to complete it. We care about you.  Kari Montero, PA-C  

## 2017-04-03 LAB — LIPID PANEL
Chol/HDL Ratio: 5.2 ratio — ABNORMAL HIGH (ref 0.0–5.0)
Cholesterol, Total: 177 mg/dL (ref 100–199)
HDL: 34 mg/dL — ABNORMAL LOW (ref >39)
LDL Calculated: 105 mg/dL — ABNORMAL HIGH (ref 0–99)
Triglycerides: 192 mg/dL — ABNORMAL HIGH (ref 0–149)
VLDL CHOLESTEROL CAL: 38 mg/dL (ref 5–40)

## 2017-04-03 LAB — CMP14+EGFR
ALBUMIN: 4.3 g/dL (ref 3.5–5.5)
ALT: 29 IU/L (ref 0–44)
AST: 29 IU/L (ref 0–40)
Albumin/Globulin Ratio: 1.7 (ref 1.2–2.2)
Alkaline Phosphatase: 60 IU/L (ref 39–117)
BILIRUBIN TOTAL: 0.3 mg/dL (ref 0.0–1.2)
BUN/Creatinine Ratio: 27 — ABNORMAL HIGH (ref 9–20)
BUN: 20 mg/dL (ref 6–24)
CO2: 21 mmol/L (ref 18–29)
Calcium: 9.3 mg/dL (ref 8.7–10.2)
Chloride: 103 mmol/L (ref 96–106)
Creatinine, Ser: 0.75 mg/dL — ABNORMAL LOW (ref 0.76–1.27)
GFR calc non Af Amer: 103 mL/min/{1.73_m2} (ref >59)
GFR, EST AFRICAN AMERICAN: 119 mL/min/{1.73_m2} (ref >59)
GLOBULIN, TOTAL: 2.5 g/dL (ref 1.5–4.5)
GLUCOSE: 103 mg/dL — AB (ref 65–99)
Potassium: 4.3 mmol/L (ref 3.5–5.2)
SODIUM: 139 mmol/L (ref 134–144)
TOTAL PROTEIN: 6.8 g/dL (ref 6.0–8.5)

## 2017-04-04 NOTE — Progress Notes (Signed)
BP 128/81   Pulse 62   Temp 98.2 F (36.8 C) (Oral)   Ht '5\' 11"'$  (1.803 m)   Wt 220 lb 6.4 oz (100 kg)   BMI 30.74 kg/m    Subjective:    Patient ID: [name], male    DOB: [birth year], 56 y.o.   MRN: [id number]  HPI: [name] is a 56 y.o. male presenting on 04/02/2017 for Follow-up; Hyperlipidemia; Hypertension; and Diabetes  This patient comes in for periodic recheck on medications and conditions including diet controlled diabetes, CAD, HTN, elevated cholesterol, depression.   All medications are reviewed today. There are no reports of any problems with the medications. All of the medical conditions are reviewed and updated.  Lab work is reviewed and will be ordered as medically necessary. There are no new problems reported with today's visit.  Relevant past medical, surgical, family and social history reviewed and updated as indicated. Allergies and medications reviewed and updated.  Past Medical History:  Diagnosis Date  . Anxiety   . Benign essential HTN    pt denies  . CAD (coronary artery disease)   . DDD (degenerative disc disease), lumbar   . Depression   . DM (diabetes mellitus) ([place])    diet controlled  . Fatty liver   . MEQASTMH(962.2)    "monthly" (08/11/2014)  . Hyperlipidemia   . Intermediate coronary syndrome ([place])   . Kidney stone    "passed it"  . Obesity   . Palpitation   . Pancreatic divisum     Past Surgical History:  Procedure Laterality Date  . CARDIAC CATHETERIZATION  2007  . ENTEROSCOPY N/A 03/20/2015   Procedure: ENTEROSCOPY;  Surgeon: Jerene Bears, MD;  Location: [facility] ENDOSCOPY;  Service: Endoscopy;  Laterality: N/A;  . LEFT HEART CATHETERIZATION WITH CORONARY ANGIOGRAM N/A 08/12/2014   Procedure: LEFT HEART CATHETERIZATION WITH CORONARY ANGIOGRAM;  Surgeon: Burnell Blanks, MD;  Location: [facility] CATH LAB;  Service: Cardiovascular;  Laterality: N/A;  . POSTERIOR LUMBAR FUSION  2010?  . TONSILLECTOMY  1979    Review of Systems    Constitutional: Negative.  Negative for appetite change and fatigue.  HENT: Negative.   Eyes: Negative.  Negative for pain and visual disturbance.  Respiratory: Negative.  Negative for cough, chest tightness, shortness of breath and wheezing.   Cardiovascular: Negative.  Negative for chest pain, palpitations and leg swelling.  Gastrointestinal: Negative.  Negative for abdominal pain, diarrhea, nausea and vomiting.  Endocrine: Negative.   Genitourinary: Negative.   Musculoskeletal: Negative.   Skin: Negative.  Negative for color change and rash.  Neurological: Negative.  Negative for weakness, numbness and headaches.  Psychiatric/Behavioral: Negative.     Allergies as of 04/02/2017   No Known Allergies     Medication List       Accurate as of 04/02/17 11:59 PM. Always use your most recent med list.          ALPRAZolam 0.25 MG tablet Commonly known as:  XANAX Take 0.25 mg by mouth at bedtime as needed for anxiety.   aspirin 325 MG tablet Take 325 mg by mouth daily.   atorvastatin 80 MG tablet Commonly known as:  LIPITOR Take 1 tablet (80 mg total) by mouth daily.   Choline Fenofibrate 135 MG capsule Take 1 capsule (135 mg total) by mouth daily.   escitalopram 20 MG tablet Commonly known as:  LEXAPRO TAKE 1 TABLET (20 MG TOTAL) BY MOUTH DAILY.   Fish Oil 1000 MG  Cpdr Take 2,000 mg by mouth daily.   HYDROcodone-acetaminophen 5-325 MG tablet Commonly known as:  NORCO/VICODIN Take 1 tablet by mouth every 8 (eight) hours as needed for moderate pain.   nebivolol 5 MG tablet Commonly known as:  BYSTOLIC Take 1 tablet (5 mg total) by mouth daily.   SUPER B COMPLEX PO Take 1 tablet by mouth daily.   Vitamin D 2000 units tablet Take 2,000 Units by mouth daily.          Objective:    BP 128/81   Pulse 62   Temp 98.2 F (36.8 C) (Oral)   Ht '5\' 11"'$  (1.803 m)   Wt 220 lb 6.4 oz (100 kg)   BMI 30.74 kg/m   No Known Allergies  Physical Exam  Constitutional:  He appears well-developed and well-nourished.  HENT:  Head: Normocephalic and atraumatic.  Eyes: Conjunctivae and EOM are normal. Pupils are equal, round, and reactive to light.  Neck: Normal [name] of motion. Neck supple.  Cardiovascular: Normal rate, regular rhythm and normal heart sounds.   Pulmonary/Chest: Effort normal and breath sounds normal.  Abdominal: Soft. Bowel sounds are normal.  Musculoskeletal: Normal [name] of motion.  Skin: Skin is warm and dry.    Results for orders placed or performed in visit on 04/02/17  Lipid panel  Result Value Ref [name]   Cholesterol, Total 177 100 - 199 mg/dL   Triglycerides 192 (H) 0 - 149 mg/dL   HDL 34 (L) >39 mg/dL   VLDL Cholesterol Cal 38 5 - 40 mg/dL   LDL Calculated 105 (H) 0 - 99 mg/dL   Chol/HDL Ratio 5.2 (H) 0.0 - 5.0 ratio  CMP14+EGFR  Result Value Ref [name]   Glucose 103 (H) 65 - 99 mg/dL   BUN 20 6 - 24 mg/dL   Creatinine, Ser 0.75 (L) 0.76 - 1.27 mg/dL   GFR calc non Af Amer 103 >59 mL/min/1.73   GFR calc Af Amer 119 >59 mL/min/1.73   BUN/Creatinine Ratio 27 (H) 9 - 20   Sodium 139 134 - 144 mmol/L   Potassium 4.3 3.5 - 5.2 mmol/L   Chloride 103 96 - 106 mmol/L   CO2 21 18 - 29 mmol/L   Calcium 9.3 8.7 - 10.2 mg/dL   Total Protein 6.8 6.0 - 8.5 g/dL   Albumin 4.3 3.5 - 5.5 g/dL   Globulin, Total 2.5 1.5 - 4.5 g/dL   Albumin/Globulin Ratio 1.7 1.2 - 2.2   Bilirubin Total 0.3 0.0 - 1.2 mg/dL   Alkaline Phosphatase 60 39 - 117 IU/L   AST 29 0 - 40 IU/L   ALT 29 0 - 44 IU/L  Bayer DCA Hb A1c Waived  Result Value Ref [name]   Bayer DCA Hb A1c Waived 5.5 <7.0 %      Assessment & Plan:   1. Hyperlipidemia with target low density lipoprotein (LDL) cholesterol less than 100 mg/dL - EKG 12-Lead - Lipid panel - CMP14+EGFR  2. Benign essential HTN - EKG 12-Lead - Lipid panel - CMP14+EGFR  3. DDD (degenerative disc disease), lumbar - HYDROcodone-acetaminophen (NORCO/VICODIN) 5-325 MG tablet; Take 1 tablet by mouth  every 8 (eight) hours as needed for moderate pain.  Dispense: 90 tablet; Refill: 0  4. Type 2 diabetes mellitus with hyperglycemia, without long-term current use of insulin (HCC) - Bayer DCA Hb A1c Waived   Continue all other maintenance medications as listed above.  Follow up plan: Recheck 6 months  Educational handout given for carb counting  Terald Sleeper PA-C [facility] [address]  [place], [place] [address] [phone]   04/04/2017, 9:49 PM

## 2017-06-24 ENCOUNTER — Other Ambulatory Visit: Payer: Self-pay

## 2017-06-24 MED ORDER — CHOLINE FENOFIBRATE 135 MG PO CPDR: 135.0000 mg | DELAYED_RELEASE_CAPSULE | Freq: Every day | ORAL | 1 refills | Status: DC

## 2017-06-24 MED ORDER — ATORVASTATIN CALCIUM 80 MG PO TABS: 80.0000 mg | ORAL_TABLET | Freq: Every day | ORAL | 1 refills | Status: DC

## 2017-06-24 MED FILL — FENOFIBRIC ACID 135 MG CPDR: 135 | 90 days supply | Qty: 90 | Fill #0

## 2017-06-24 MED FILL — ATORVASTATIN 80 MG TABLET: 80 | 90 days supply | Qty: 90 | Fill #0

## 2017-08-07 ENCOUNTER — Other Ambulatory Visit: Payer: Self-pay | Admitting: *Deleted

## 2017-08-07 DIAGNOSIS — M5136 Other intervertebral disc degeneration, lumbar region: Secondary | ICD-10-CM

## 2017-08-07 DIAGNOSIS — M51369 Other intervertebral disc degeneration, lumbar region without mention of lumbar back pain or lower extremity pain: Secondary | ICD-10-CM

## 2017-08-07 DIAGNOSIS — H9193 Unspecified hearing loss, bilateral: Secondary | ICD-10-CM

## 2017-08-07 MED ORDER — HYDROCODONE-ACETAMINOPHEN 5-325 MG PO TABS: 1.0000 | ORAL_TABLET | Freq: Three times a day (TID) | ORAL | 0 refills | Status: DC | PRN

## 2017-08-07 NOTE — Telephone Encounter (Signed)
Wife aware

## 2017-08-07 NOTE — Telephone Encounter (Signed)
Patient is requesting a refill on hydrocodone. Please review

## 2017-08-15 ENCOUNTER — Encounter: Payer: Self-pay | Admitting: Family Medicine

## 2017-08-15 ENCOUNTER — Ambulatory Visit (INDEPENDENT_AMBULATORY_CARE_PROVIDER_SITE_OTHER): Payer: 59 | Admitting: Family Medicine

## 2017-08-15 VITALS — BP 127/80 | HR 61 | Temp 97.6°F | Ht 71.0 in | Wt 225.0 lb

## 2017-08-15 DIAGNOSIS — M151 Heberden's nodes (with arthropathy): Secondary | ICD-10-CM | POA: Diagnosis not present

## 2017-08-15 DIAGNOSIS — M67911 Unspecified disorder of synovium and tendon, right shoulder: Secondary | ICD-10-CM

## 2017-08-15 MED ORDER — METHYLPREDNISOLONE ACETATE 80 MG/ML IJ SUSP
80.0000 mg | Freq: Once | INTRAMUSCULAR | Status: AC
  Administered 2017-08-15: 80 mg via INTRAMUSCULAR

## 2017-08-15 NOTE — Progress Notes (Signed)
BP 127/80   Pulse 61   Temp 97.6 F (36.4 C) (Oral)   Ht 5\' 11"  (1.803 m)   Wt 225 lb (102.1 kg)   BMI 31.38 kg/m    Subjective:    Patient ID: [name], [name]    DOB: 1961/02/10, 56 y.o.   MRN: [id number]  HPI: [name] is a 56 y.o. [name] presenting on 08/15/2017 for Shoulder Pain (Right shoulder, ongoing for several months, worse with activity) and Lesion on finger (3rd digit, right hand)   HPI Shoulder pain Patient has been complaining of right shoulder pain that comes and goes but is more often with activity and he does not have any at rest. The pain does not wake him up at night. He says the pain is mild but has been bothering him for some time. He denies any fevers or chills or redness or warmth. He has not noticed any swelling in that arm. He denies any numbness or weakness. He says the pain is along the lateral aspect of the upper shoulder but sometimes comes around posteriorly as well.  Finger lesion on right hand Patient has a swollen lump near the last joint on his right middle finger that has been there for quite some time. He has drained it on his own before and has found it to be viscous clear liquid comes out. He denies any fevers or chills or redness or warmth or pain associated with it. It gets bigger and smaller at times  Relevant past medical, surgical, family and social history reviewed and updated as indicated. Interim medical history since our last visit reviewed. Allergies and medications reviewed and updated.  Review of Systems  Constitutional: Negative for chills and fever.  Respiratory: Negative for shortness of breath and wheezing.   Cardiovascular: Negative for chest pain and leg swelling.  Musculoskeletal: Positive for arthralgias. Negative for back pain, gait problem, joint swelling and myalgias.  Skin: Negative for color change and rash.  All other systems reviewed and are negative.   Per HPI unless specifically indicated above       Objective:    BP 127/80   Pulse 61   Temp 97.6 F (36.4 C) (Oral)   Ht 5\' 11"  (1.803 m)   Wt 225 lb (102.1 kg)   BMI 31.38 kg/m   Wt Readings from Last 3 Encounters:  08/15/17 225 lb (102.1 kg)  04/02/17 220 lb 6.4 oz (100 kg)  08/24/16 224 lb 9.6 oz (101.9 kg)    Physical Exam  Constitutional: He is oriented to person, place, and time. He appears well-developed and well-nourished. No distress.  Eyes: Conjunctivae are normal. No scleral icterus.  Cardiovascular: Normal rate, regular rhythm, normal heart sounds and intact distal pulses.   No murmur heard. Pulmonary/Chest: Effort normal and breath sounds normal. No respiratory distress. He has no wheezes. He has no rales.  Musculoskeletal: Normal range of motion. He exhibits tenderness (Tenderness in right shoulder, positive Hawkins impingement, positive empty can test and positive external rotation causing tenderness but no weakness noted). He exhibits no edema.  Neurological: He is alert and oriented to person, place, and time. Coordination normal.  Skin: Skin is warm and dry. Lesion (Heberden's node on right middle finger DIP joint) noted. No rash noted. He is not diaphoretic.  Psychiatric: He has a normal mood and affect. His behavior is normal.  Nursing note and vitals reviewed.  Right Subacromial injection: Consent form signed. Risk factors of bleeding and infection discussed  with patient and patient is agreeable towards injection. Patient prepped with Betadine. Lateral approach towards injection used. Injected 80 mg of Depo-Medrol and 1 mL of 2% lidocaine. Patient tolerated procedure well and no side effects from noted. Minimal to no bleeding. Simple bandage applied after.     Assessment & Plan:   Problem List Items Addressed This Visit    None    Visit Diagnoses    Tendinopathy of right rotator cuff    -  Primary   Relevant Medications   methylPREDNISolone acetate (DEPO-MEDROL) injection 80 mg (Completed)   Heberden's  nodes of right hand           Follow up plan: Return if symptoms worsen or fail to improve.  Counseling provided for all of the vaccine components No orders of the defined types were placed in this encounter.   Caryl Pina, MD [facility] 08/15/2017, 9:30 AM

## 2017-09-23 ENCOUNTER — Other Ambulatory Visit: Payer: Self-pay | Admitting: *Deleted

## 2017-09-23 DIAGNOSIS — F419 Anxiety disorder, unspecified: Secondary | ICD-10-CM

## 2017-09-23 MED ORDER — ESCITALOPRAM OXALATE 20 MG PO TABS: 20.0000 mg | ORAL_TABLET | Freq: Every day | ORAL | 1 refills | Status: DC

## 2017-09-23 MED FILL — ESCITALOPRAM 20 MG TABLET: 20 | 90 days supply | Qty: 90 | Fill #0

## 2017-10-02 ENCOUNTER — Telehealth: Payer: Self-pay

## 2017-10-02 ENCOUNTER — Ambulatory Visit (INDEPENDENT_AMBULATORY_CARE_PROVIDER_SITE_OTHER): Payer: 59

## 2017-10-02 DIAGNOSIS — Z23 Encounter for immunization: Secondary | ICD-10-CM

## 2017-10-02 MED ORDER — ICOSAPENT ETHYL 1 G PO CAPS: 2.0000 g | ORAL_CAPSULE | Freq: Two times a day (BID) | ORAL | 3 refills | Status: DC

## 2017-10-02 MED FILL — VASCEPA 1 GM CAPSULE: 1 | 90 days supply | Qty: 360 | Fill #0

## 2017-10-02 NOTE — Addendum Note (Signed)
Addended by: Terald Sleeper on: 10/02/2017 01:07 PM   Modules accepted: Orders

## 2017-10-02 NOTE — Telephone Encounter (Signed)
Patient is taking Trilipix and OTC fish oil daily.  Could he switch to Vascepa and discontinue both of these?  If so, please send to [facility] and advise patient's wife.

## 2017-10-02 NOTE — Telephone Encounter (Signed)
Patient aware.

## 2017-10-07 MED FILL — ATORVASTATIN 80 MG TABLET: 80 | 90 days supply | Qty: 90 | Fill #1

## 2017-10-07 MED FILL — FENOFIBRIC ACID 135 MG CPDR: 135 | 90 days supply | Qty: 90 | Fill #1

## 2017-11-08 ENCOUNTER — Other Ambulatory Visit: Payer: Self-pay | Admitting: *Deleted

## 2017-11-08 DIAGNOSIS — M5136 Other intervertebral disc degeneration, lumbar region: Secondary | ICD-10-CM

## 2017-11-08 MED ORDER — HYDROCODONE-ACETAMINOPHEN 5-325 MG PO TABS: 1.0000 | ORAL_TABLET | Freq: Three times a day (TID) | ORAL | 0 refills | Status: DC | PRN

## 2017-11-08 NOTE — Telephone Encounter (Signed)
Patient is requesting a refill on hydrocodone.

## 2018-01-15 MED FILL — SHIPPING COST: 1 days supply | Qty: 1 | Fill #0

## 2018-01-15 MED FILL — ESCITALOPRAM 20 MG TABLET: 20 | 90 days supply | Qty: 90 | Fill #1

## 2018-03-03 MED FILL — SHIPPING COST: 1 days supply | Qty: 1 | Fill #1

## 2018-03-03 MED FILL — VASCEPA 1 GM CAPSULE: 1 | 90 days supply | Qty: 360 | Fill #1

## 2018-03-25 ENCOUNTER — Other Ambulatory Visit: Payer: Self-pay

## 2018-03-25 MED ORDER — ATORVASTATIN CALCIUM 80 MG PO TABS: 80.0000 mg | ORAL_TABLET | Freq: Every day | ORAL | 1 refills | Status: DC

## 2018-03-25 MED FILL — SHIPPING COST: 1 days supply | Qty: 1 | Fill #2

## 2018-03-25 MED FILL — ATORVASTATIN 80 MG TABLET: 80 | 90 days supply | Qty: 90 | Fill #0

## 2018-04-02 ENCOUNTER — Ambulatory Visit (INDEPENDENT_AMBULATORY_CARE_PROVIDER_SITE_OTHER): Payer: 59

## 2018-04-02 ENCOUNTER — Ambulatory Visit (INDEPENDENT_AMBULATORY_CARE_PROVIDER_SITE_OTHER): Payer: 59 | Admitting: Physician Assistant

## 2018-04-02 ENCOUNTER — Encounter: Payer: Self-pay | Admitting: Physician Assistant

## 2018-04-02 VITALS — BP 121/79 | HR 76 | Temp 99.1°F | Ht 71.0 in | Wt 223.0 lb

## 2018-04-02 DIAGNOSIS — I10 Essential (primary) hypertension: Secondary | ICD-10-CM | POA: Diagnosis not present

## 2018-04-02 DIAGNOSIS — J209 Acute bronchitis, unspecified: Secondary | ICD-10-CM

## 2018-04-02 DIAGNOSIS — E785 Hyperlipidemia, unspecified: Secondary | ICD-10-CM

## 2018-04-02 DIAGNOSIS — Z0000 Encounter for general adult medical examination without abnormal findings: Secondary | ICD-10-CM

## 2018-04-02 DIAGNOSIS — E1165 Type 2 diabetes mellitus with hyperglycemia: Secondary | ICD-10-CM | POA: Diagnosis not present

## 2018-04-02 DIAGNOSIS — R5383 Other fatigue: Secondary | ICD-10-CM | POA: Diagnosis not present

## 2018-04-02 LAB — BAYER DCA HB A1C WAIVED: HB A1C (BAYER DCA - WAIVED): 5.8 % (ref <7.0)

## 2018-04-02 MED ORDER — AZITHROMYCIN 250 MG PO TABS: ORAL_TABLET | ORAL | 0 refills | Status: DC

## 2018-04-02 NOTE — Progress Notes (Signed)
BP 121/79   Pulse 76   Temp 99.1 F (37.3 C) (Oral)   Ht 5\' 11"  (1.803 m)   Wt 223 lb (101.2 kg)   BMI 31.10 kg/m    Subjective:    Patient ID: [name], male    DOB: [birth year], 57 y.o.   MRN: [id number]  HPI: [name] is a 57 y.o. male presenting on 04/02/2018 for Annual Exam; Cough; Sore Throat; and Sinusitis  This patient comes in for annual well physical examination. All medications are reviewed today. There are no reports of any problems with the medications. All of the medical conditions are reviewed and updated.  Lab work is reviewed and will be ordered as medically necessary.   Patient with several days of progressing upper respiratory and bronchial symptoms. Initially there was more upper respiratory congestion. This progressed to having significant cough that is productive throughout the day and severe at night. There is occasional wheezing after coughing. Sometimes there is slight dyspnea on exertion. It is productive mucus that is yellow in color. Denies any blood.    Past Medical History:  Diagnosis Date  . Anxiety   . Benign essential HTN    pt denies  . CAD (coronary artery disease)   . DDD (degenerative disc disease), lumbar   . Depression   . DM (diabetes mellitus) ([place])    diet controlled  . Fatty liver   . TKWIOXBD(532.9)    "monthly" (08/11/2014)  . Hyperlipidemia   . Intermediate coronary syndrome ([place])   . Kidney stone    "passed it"  . Obesity   . Palpitation   . Pancreatic divisum    Relevant past medical, surgical, family and social history reviewed and updated as indicated. Interim medical history since our last visit reviewed. Allergies and medications reviewed and updated. DATA REVIEWED: CHART IN EPIC  Family History reviewed for pertinent findings.  Review of Systems  Constitutional: Positive for fatigue. Negative for appetite change and fever.  HENT: Positive for congestion, sinus pressure and sore throat.   Eyes: Negative.   Negative for pain and visual disturbance.  Respiratory: Positive for shortness of breath and wheezing. Negative for cough and chest tightness.   Cardiovascular: Negative.  Negative for chest pain, palpitations and leg swelling.  Gastrointestinal: Negative.  Negative for abdominal pain, diarrhea, nausea and vomiting.  Endocrine: Negative.   Genitourinary: Negative.   Musculoskeletal: Positive for back pain and myalgias.  Skin: Negative.  Negative for color change and rash.  Neurological: Negative for weakness, numbness and headaches.  Psychiatric/Behavioral: Negative.     Allergies as of 04/02/2018   No Known Allergies     Medication List        Accurate as of 04/02/18  1:44 PM. Always use your most recent med list.          ALPRAZolam 0.25 MG tablet Commonly known as:  XANAX Take 0.25 mg by mouth at bedtime as needed for anxiety.   aspirin 325 MG tablet Take 325 mg by mouth daily.   atorvastatin 80 MG tablet Commonly known as:  LIPITOR Take 1 tablet (80 mg total) by mouth daily.   azithromycin 250 MG tablet Commonly known as:  ZITHROMAX Take as directed   escitalopram 20 MG tablet Commonly known as:  LEXAPRO Take 1 tablet (20 mg total) by mouth daily.   HYDROcodone-acetaminophen 5-325 MG tablet Commonly known as:  NORCO/VICODIN Take 1 tablet by mouth every 8 (eight) hours as needed for moderate pain.  Icosapent Ethyl 1 g Caps Commonly known as:  VASCEPA Take 2 g 2 (two) times daily by mouth.   nebivolol 5 MG tablet Commonly known as:  BYSTOLIC Take 1 tablet (5 mg total) by mouth daily.   Vitamin D 2000 units tablet Take 2,000 Units by mouth daily.          Objective:    BP 121/79   Pulse 76   Temp 99.1 F (37.3 C) (Oral)   Ht 5\' 11"  (1.803 m)   Wt 223 lb (101.2 kg)   BMI 31.10 kg/m   No Known Allergies  Wt Readings from Last 3 Encounters:  04/02/18 223 lb (101.2 kg)  08/15/17 225 lb (102.1 kg)  04/02/17 220 lb 6.4 oz (100 kg)    Physical  Exam  Constitutional: He appears well-developed and well-nourished.  HENT:  Head: Normocephalic and atraumatic.  Right Ear: Hearing and tympanic membrane normal.  Left Ear: Hearing and tympanic membrane normal.  Nose: Mucosal edema and sinus tenderness present. No nasal deformity. Right sinus exhibits frontal sinus tenderness. Left sinus exhibits frontal sinus tenderness.  Mouth/Throat: Posterior oropharyngeal erythema present.  Eyes: Pupils are equal, round, and reactive to light. Conjunctivae and EOM are normal. Right eye exhibits no discharge. Left eye exhibits no discharge.  Neck: Normal range of motion. Neck supple.  Cardiovascular: Normal rate, regular rhythm and normal heart sounds.  Pulmonary/Chest: Effort normal. No respiratory distress. He has no decreased breath sounds. He has wheezes. He has no rhonchi. He has no rales.  Abdominal: Soft. Bowel sounds are normal.  Musculoskeletal: Normal range of motion.  Skin: Skin is warm and dry.        Assessment & Plan:   1. Well adult exam Annal labs drawn today Recheck in 1 year All regular medications will be sent to pharmacy  2. Bronchitis, acute, with bronchospasm - DG Chest 2 View; Future - azithromycin (ZITHROMAX) 250 MG tablet; Take as directed  Dispense: 6 tablet; Refill: 0    Continue all other maintenance medications as listed above.  Follow up plan: No follow-ups on file.  Educational handout given for health  [place] PA-C [facility] [address]  [place], [place] [address] [phone]   04/02/2018, 1:44 PM

## 2018-04-02 NOTE — Addendum Note (Signed)
Addended by: Thana Ates on: 04/02/2018 03:52 PM   Modules accepted: Orders

## 2018-04-03 LAB — CMP14+EGFR
A/G RATIO: 1.2 (ref 1.2–2.2)
ALT: 32 IU/L (ref 0–44)
AST: 28 IU/L (ref 0–40)
Albumin: 3.7 g/dL (ref 3.5–5.5)
Alkaline Phosphatase: 79 IU/L (ref 39–117)
BUN / CREAT RATIO: 17 (ref 9–20)
BUN: 14 mg/dL (ref 6–24)
Bilirubin Total: 0.4 mg/dL (ref 0.0–1.2)
CALCIUM: 9.1 mg/dL (ref 8.7–10.2)
CO2: 23 mmol/L (ref 20–29)
Chloride: 98 mmol/L (ref 96–106)
Creatinine, Ser: 0.81 mg/dL (ref 0.76–1.27)
GFR, EST AFRICAN AMERICAN: 115 mL/min/{1.73_m2} (ref >59)
GFR, EST NON AFRICAN AMERICAN: 99 mL/min/{1.73_m2} (ref >59)
GLOBULIN, TOTAL: 3 g/dL (ref 1.5–4.5)
GLUCOSE: 97 mg/dL (ref 65–99)
Potassium: 4.2 mmol/L (ref 3.5–5.2)
SODIUM: 137 mmol/L (ref 134–144)
TOTAL PROTEIN: 6.7 g/dL (ref 6.0–8.5)

## 2018-04-03 LAB — LIPID PANEL
Chol/HDL Ratio: 6.2 ratio — ABNORMAL HIGH (ref 0.0–5.0)
Cholesterol, Total: 197 mg/dL (ref 100–199)
HDL: 32 mg/dL — ABNORMAL LOW (ref >39)
LDL CALC: 135 mg/dL — AB (ref 0–99)
Triglycerides: 149 mg/dL (ref 0–149)
VLDL Cholesterol Cal: 30 mg/dL (ref 5–40)

## 2018-04-03 LAB — THYROID PANEL WITH TSH
Free Thyroxine Index: 1.9 (ref 1.2–4.9)
T3 Uptake Ratio: 27 % (ref 24–39)
T4, Total: 7 ug/dL (ref 4.5–12.0)
TSH: 1.07 u[IU]/mL (ref 0.450–4.500)

## 2018-04-03 LAB — CBC WITH DIFFERENTIAL/PLATELET
BASOS: 0 %
Basophils Absolute: 0.1 10*3/uL (ref 0.0–0.2)
EOS (ABSOLUTE): 0.1 10*3/uL (ref 0.0–0.4)
Eos: 1 %
Hematocrit: 45.7 % (ref 37.5–51.0)
Hemoglobin: 15.4 g/dL (ref 13.0–17.7)
Immature Grans (Abs): 0 10*3/uL (ref 0.0–0.1)
Immature Granulocytes: 0 %
LYMPHS ABS: 2.3 10*3/uL (ref 0.7–3.1)
Lymphs: 19 %
MCH: 30.2 pg (ref 26.6–33.0)
MCHC: 33.7 g/dL (ref 31.5–35.7)
MCV: 90 fL (ref 79–97)
MONOS ABS: 1 10*3/uL — AB (ref 0.1–0.9)
Monocytes: 8 %
NEUTROS ABS: 8.7 10*3/uL — AB (ref 1.4–7.0)
Neutrophils: 72 %
Platelets: 238 10*3/uL (ref 150–379)
RBC: 5.1 x10E6/uL (ref 4.14–5.80)
RDW: 13.6 % (ref 12.3–15.4)
WBC: 12.2 10*3/uL — ABNORMAL HIGH (ref 3.4–10.8)

## 2018-04-03 LAB — PSA, TOTAL AND FREE
PROSTATE SPECIFIC AG, SERUM: 0.8 ng/mL (ref 0.0–4.0)
PSA FREE PCT: 35 %
PSA, Free: 0.28 ng/mL

## 2018-04-24 ENCOUNTER — Other Ambulatory Visit: Payer: Self-pay | Admitting: *Deleted

## 2018-04-24 DIAGNOSIS — F419 Anxiety disorder, unspecified: Secondary | ICD-10-CM

## 2018-04-24 MED ORDER — ESCITALOPRAM OXALATE 20 MG PO TABS: 20.0000 mg | ORAL_TABLET | Freq: Every day | ORAL | 1 refills | Status: DC

## 2018-04-24 MED FILL — SHIPPING COST: 1 days supply | Qty: 1 | Fill #3

## 2018-04-24 MED FILL — ESCITALOPRAM 20 MG TABLET: 20 | 90 days supply | Qty: 90 | Fill #0

## 2018-05-07 ENCOUNTER — Ambulatory Visit (INDEPENDENT_AMBULATORY_CARE_PROVIDER_SITE_OTHER): Payer: 59 | Admitting: Physician Assistant

## 2018-05-07 ENCOUNTER — Encounter: Payer: Self-pay | Admitting: Physician Assistant

## 2018-05-07 VITALS — BP 141/93 | HR 86 | Temp 98.0°F | Ht 71.0 in | Wt 223.0 lb

## 2018-05-07 DIAGNOSIS — F321 Major depressive disorder, single episode, moderate: Secondary | ICD-10-CM

## 2018-05-07 MED ORDER — ALPRAZOLAM 0.5 MG PO TABS: 0.5000 mg | ORAL_TABLET | Freq: Two times a day (BID) | ORAL | 1 refills | Status: DC | PRN

## 2018-05-07 MED ORDER — DULOXETINE HCL 30 MG PO CPEP: 30.0000 mg | ORAL_CAPSULE | Freq: Every day | ORAL | 1 refills | Status: DC

## 2018-05-09 NOTE — Progress Notes (Signed)
BP (!) 141/93   Pulse 86   Temp 98 F (36.7 C) (Oral)   Ht '5\' 11"'$  (1.803 m)   Wt 223 lb (101.2 kg)   BMI 31.10 kg/m    Subjective:    Patient ID: [name], male    DOB: Mar 28, 1961, 57 y.o.   MRN: [id number]  HPI: [name] is a 57 y.o. male presenting on 05/07/2018 for Depression (lexapro not effective) This patient comes in to discuss his depression.  He states he has had a very hard time lately and with the amount of stress is going on.  He is not able to cope.  In the past he is always been able to do this and it is quite bothersome.  States is very emotional, frustrated, and "just feels broken".  He is not suicidal or homicidal at this time.  He has been on Lexapro for some time.  He feels that his overall anxiety is up and he is having near anxiety attacks at times.  It will last for a few moments.  He is decreased with his concentration.  He is also having some headaches. He is attending counseling with his wife.  I have encouraged him to continue working on this. Depression screen [facility] 2/9 05/07/2018 04/02/2018 08/15/2017 04/02/2017 08/24/2016  Decreased Interest 2 0 0 0 0  Down, Depressed, Hopeless 3 0 0 0 0  PHQ - 2 Score 5 0 0 0 0  Altered sleeping 1 - - - -  Tired, decreased energy 2 - - - -  Change in appetite 2 - - - -  Feeling bad or failure about yourself  2 - - - -  Trouble concentrating 2 - - - -  Moving slowly or fidgety/restless 2 - - - -  Suicidal thoughts 0 - - - -  PHQ-9 Score 16 - - - -      Past Medical History:  Diagnosis Date  . Anxiety   . Benign essential HTN    pt denies  . CAD (coronary artery disease)   . DDD (degenerative disc disease), lumbar   . Depression   . DM (diabetes mellitus) ([place])    diet controlled  . Fatty liver   . QZESPQZR(007.6)    "monthly" (08/11/2014)  . Hyperlipidemia   . Intermediate coronary syndrome ([place])   . Kidney stone    "passed it"  . Obesity   . Palpitation   . Pancreatic divisum    Relevant past  medical, surgical, family and social history reviewed and updated as indicated. Interim medical history since our last visit reviewed. Allergies and medications reviewed and updated. DATA REVIEWED: CHART IN EPIC  Family History reviewed for pertinent findings.  Review of Systems  Constitutional: Negative.  Negative for appetite change and fatigue.  HENT: Negative.   Eyes: Negative.  Negative for pain and visual disturbance.  Respiratory: Negative.  Negative for cough, chest tightness, shortness of breath and wheezing.   Cardiovascular: Negative.  Negative for chest pain, palpitations and leg swelling.  Gastrointestinal: Negative.  Negative for abdominal pain, diarrhea, nausea and vomiting.  Endocrine: Negative.   Genitourinary: Negative.   Musculoskeletal: Negative.   Skin: Negative.  Negative for color change and rash.  Neurological: Negative.  Negative for weakness, numbness and headaches.  Psychiatric/Behavioral: Positive for decreased concentration and dysphoric mood. Negative for self-injury, sleep disturbance and suicidal ideas. The patient is nervous/anxious.     Allergies as of 05/07/2018   No Known Allergies  Medication List        Accurate as of 05/07/18 11:59 PM. Always use your most recent med list.          ALPRAZolam 0.5 MG tablet Commonly known as:  XANAX Take 1 tablet (0.5 mg total) by mouth 2 (two) times daily as needed for anxiety.   aspirin 325 MG tablet Take 325 mg by mouth daily.   atorvastatin 80 MG tablet Commonly known as:  LIPITOR Take 1 tablet (80 mg total) by mouth daily.   DULoxetine 30 MG capsule Commonly known as:  CYMBALTA Take 1-2 capsules (30-60 mg total) by mouth daily.   escitalopram 20 MG tablet Commonly known as:  LEXAPRO Take 1 tablet (20 mg total) by mouth daily.   HYDROcodone-acetaminophen 5-325 MG tablet Commonly known as:  NORCO/VICODIN Take 1 tablet by mouth every 8 (eight) hours as needed for moderate pain.     Icosapent Ethyl 1 g Caps Commonly known as:  VASCEPA Take 2 g 2 (two) times daily by mouth.   nebivolol 5 MG tablet Commonly known as:  BYSTOLIC Take 1 tablet (5 mg total) by mouth daily.   Vitamin D 2000 units tablet Take 2,000 Units by mouth daily.          Objective:    BP (!) 141/93   Pulse 86   Temp 98 F (36.7 C) (Oral)   Ht '5\' 11"'$  (1.803 m)   Wt 223 lb (101.2 kg)   BMI 31.10 kg/m   No Known Allergies  Wt Readings from Last 3 Encounters:  05/07/18 223 lb (101.2 kg)  04/02/18 223 lb (101.2 kg)  08/15/17 225 lb (102.1 kg)    Physical Exam  Constitutional: He appears well-developed and well-nourished.  HENT:  Head: Normocephalic and atraumatic.  Eyes: Pupils are equal, round, and reactive to light. Conjunctivae and EOM are normal.  Neck: Normal range of motion. Neck supple.  Cardiovascular: Normal rate, regular rhythm and normal heart sounds.  Pulmonary/Chest: Effort normal and breath sounds normal.  Abdominal: Soft. Bowel sounds are normal.  Musculoskeletal: Normal range of motion.  Skin: Skin is warm and dry.    Results for orders placed or performed in visit on 04/02/18  CMP14+EGFR  Result Value Ref Range   Glucose 97 65 - 99 mg/dL   BUN 14 6 - 24 mg/dL   Creatinine, Ser 0.81 0.76 - 1.27 mg/dL   GFR calc non Af Amer 99 >59 mL/min/1.73   GFR calc Af Amer 115 >59 mL/min/1.73   BUN/Creatinine Ratio 17 9 - 20   Sodium 137 134 - 144 mmol/L   Potassium 4.2 3.5 - 5.2 mmol/L   Chloride 98 96 - 106 mmol/L   CO2 23 20 - 29 mmol/L   Calcium 9.1 8.7 - 10.2 mg/dL   Total Protein 6.7 6.0 - 8.5 g/dL   Albumin 3.7 3.5 - 5.5 g/dL   Globulin, Total 3.0 1.5 - 4.5 g/dL   Albumin/Globulin Ratio 1.2 1.2 - 2.2   Bilirubin Total 0.4 0.0 - 1.2 mg/dL   Alkaline Phosphatase 79 39 - 117 IU/L   AST 28 0 - 40 IU/L   ALT 32 0 - 44 IU/L  CBC with Differential/Platelet  Result Value Ref Range   WBC 12.2 (H) 3.4 - 10.8 x10E3/uL   RBC 5.10 4.14 - 5.80 x10E6/uL   Hemoglobin  15.4 13.0 - 17.7 g/dL   Hematocrit 45.7 37.5 - 51.0 %   MCV 90 79 - 97 fL   MCH  30.2 26.6 - 33.0 pg   MCHC 33.7 31.5 - 35.7 g/dL   RDW 13.6 12.3 - 15.4 %   Platelets 238 150 - 379 x10E3/uL   Neutrophils 72 Not Estab. %   Lymphs 19 Not Estab. %   Monocytes 8 Not Estab. %   Eos 1 Not Estab. %   Basos 0 Not Estab. %   Neutrophils Absolute 8.7 (H) 1.4 - 7.0 x10E3/uL   Lymphocytes Absolute 2.3 0.7 - 3.1 x10E3/uL   Monocytes Absolute 1.0 (H) 0.1 - 0.9 x10E3/uL   EOS (ABSOLUTE) 0.1 0.0 - 0.4 x10E3/uL   Basophils Absolute 0.1 0.0 - 0.2 x10E3/uL   Immature Granulocytes 0 Not Estab. %   Immature Grans (Abs) 0.0 0.0 - 0.1 x10E3/uL   Hematology Comments: Note:   Lipid panel  Result Value Ref Range   Cholesterol, Total 197 100 - 199 mg/dL   Triglycerides 149 0 - 149 mg/dL   HDL 32 (L) >39 mg/dL   VLDL Cholesterol Cal 30 5 - 40 mg/dL   LDL Calculated 135 (H) 0 - 99 mg/dL   Chol/HDL Ratio 6.2 (H) 0.0 - 5.0 ratio  PSA, total and free  Result Value Ref Range   Prostate Specific Ag, Serum 0.8 0.0 - 4.0 ng/mL   PSA, Free 0.28 N/A ng/mL   PSA, Free Pct 35.0 %  Thyroid Panel With TSH  Result Value Ref Range   TSH 1.070 0.450 - 4.500 uIU/mL   T4, Total 7.0 4.5 - 12.0 ug/dL   T3 Uptake Ratio 27 24 - 39 %   Free Thyroxine Index 1.9 1.2 - 4.9  Bayer DCA Hb A1c Waived  Result Value Ref Range   HB A1C (BAYER DCA - WAIVED) 5.8 <7.0 %      Assessment & Plan:   1. Depression, major, single episode, moderate (HCC) - DULoxetine (CYMBALTA) 30 MG capsule; Take 1-2 capsules (30-60 mg total) by mouth daily.  Dispense: 60 capsule; Refill: 1 - ALPRAZolam (XANAX) 0.5 MG tablet; Take 1 tablet (0.5 mg total) by mouth 2 (two) times daily as needed for anxiety.  Dispense: 40 tablet; Refill: 1    Continue all other maintenance medications as listed above.  Follow up plan: Return in about 1 month (around 06/04/2018).  Educational handout given for [place] PA-C [facility] [address]  [place], [place] [address] [phone]   05/09/2018, 11:11 AM

## 2018-05-15 ENCOUNTER — Other Ambulatory Visit: Payer: Self-pay | Admitting: *Deleted

## 2018-05-15 DIAGNOSIS — I10 Essential (primary) hypertension: Secondary | ICD-10-CM

## 2018-05-15 MED ORDER — NEBIVOLOL HCL 5 MG PO TABS: 5.0000 mg | ORAL_TABLET | Freq: Every day | ORAL | 1 refills | Status: DC

## 2018-05-15 MED FILL — BYSTOLIC 5 MG TABLET: 5 | 90 days supply | Qty: 90 | Fill #0

## 2018-05-15 MED FILL — SHIPPING COST: 1 days supply | Qty: 1 | Fill #4

## 2018-06-02 ENCOUNTER — Other Ambulatory Visit: Payer: Self-pay | Admitting: *Deleted

## 2018-06-02 MED ORDER — DULOXETINE HCL 60 MG PO CPEP: 60.0000 mg | ORAL_CAPSULE | Freq: Every day | ORAL | 3 refills | Status: DC

## 2018-06-02 MED FILL — DULoxetine HCL 60 MG CPEP: 60 | 90 days supply | Qty: 90 | Fill #0

## 2018-06-02 MED FILL — SHIPPING COST: 1 days supply | Qty: 1 | Fill #5

## 2018-06-04 ENCOUNTER — Encounter: Payer: Self-pay | Admitting: Physician Assistant

## 2018-06-04 ENCOUNTER — Ambulatory Visit (INDEPENDENT_AMBULATORY_CARE_PROVIDER_SITE_OTHER): Payer: 59 | Admitting: Physician Assistant

## 2018-06-04 VITALS — BP 136/85 | HR 96 | Temp 97.6°F | Ht 71.0 in | Wt 211.2 lb

## 2018-06-04 DIAGNOSIS — R3129 Other microscopic hematuria: Secondary | ICD-10-CM | POA: Diagnosis not present

## 2018-06-04 DIAGNOSIS — F418 Other specified anxiety disorders: Secondary | ICD-10-CM | POA: Diagnosis not present

## 2018-06-04 DIAGNOSIS — R319 Hematuria, unspecified: Secondary | ICD-10-CM

## 2018-06-04 LAB — URINALYSIS, COMPLETE
BILIRUBIN UA: NEGATIVE
Glucose, UA: NEGATIVE
Ketones, UA: NEGATIVE
Leukocytes, UA: NEGATIVE
Nitrite, UA: NEGATIVE
PH UA: 6.5 (ref 5.0–7.5)
Specific Gravity, UA: 1.025 (ref 1.005–1.030)
Urobilinogen, Ur: 1 mg/dL (ref 0.2–1.0)

## 2018-06-04 LAB — MICROSCOPIC EXAMINATION
Bacteria, UA: NONE SEEN
RBC, UA: >30 /hpf — AB (ref 0–2)
RENAL EPITHEL UA: NONE SEEN /HPF

## 2018-06-04 NOTE — Progress Notes (Signed)
BP 136/85   Pulse 96   Temp 97.6 F (36.4 C) (Oral)   Ht '5\' 11"'$  (1.803 m)   Wt 211 lb 3.2 oz (95.8 kg)   BMI 29.46 kg/m    Subjective:    Patient ID: [name], male    DOB: January 05, 1961, 57 y.o.   MRN: [id number]  HPI: [name] is a 57 y.o. male presenting on 06/04/2018 for Depression (1 month follow up)  Depression screen [facility] 2/9 06/04/2018 05/07/2018 04/02/2018 08/15/2017 04/02/2017  Decreased Interest 0 2 0 0 0  Down, Depressed, Hopeless 1 3 0 0 0  PHQ - 2 Score 1 5 0 0 0  Altered sleeping 0 1 - - -  Tired, decreased energy 1 2 - - -  Change in appetite 1 2 - - -  Feeling bad or failure about yourself  1 2 - - -  Trouble concentrating 1 2 - - -  Moving slowly or fidgety/restless 0 2 - - -  Suicidal thoughts 0 0 - - -  PHQ-9 Score 5 16 - - -    This patient comes in for 1 month recheck on his depression medication.  He had been on Lexapro for some time but is not working very well his PHQ score last month was 6.  He started Cymbalta and is doing extremely well on the medication.  He states he feels better, more motivated, more energy.  He overall is very pleased with his medication.  We have sent refills and we will just plan to recheck him in 6 months on that  The patient informs me that about 2 weeks ago he saw gross hematuria whenever he was urinating.  It happened through a couple of voids.  It was red in color, not bright, with some slight clotting to the blood.  He did not have pain at the time.  He has not had pain in the prostate area.  He has not ran a fever.  He has had a previous kidney stone many years ago.  He had a little bit of pain into the right lower quadrant but it is completely gone at this time.  We have checked a urine he does have significant Hemicroscopic hematuria and we will set up urology for as soon as possible.  Past Medical History:  Diagnosis Date  . Anxiety   . Benign essential HTN    pt denies  . CAD (coronary artery disease)   . DDD  (degenerative disc disease), lumbar   . Depression   . DM (diabetes mellitus) ([place])    diet controlled  . Fatty liver   . VZDGLOVF(643.3)    "monthly" (08/11/2014)  . Hyperlipidemia   . Intermediate coronary syndrome ([place])   . Kidney stone    "passed it"  . Obesity   . Palpitation   . Pancreatic divisum    Relevant past medical, surgical, family and social history reviewed and updated as indicated. Interim medical history since our last visit reviewed. Allergies and medications reviewed and updated. DATA REVIEWED: CHART IN EPIC  Family History reviewed for pertinent findings.  Review of Systems  Constitutional: Negative.  Negative for appetite change and fatigue.  HENT: Negative.   Eyes: Negative.  Negative for pain and visual disturbance.  Respiratory: Negative.  Negative for cough, chest tightness, shortness of breath and wheezing.   Cardiovascular: Negative.  Negative for chest pain, palpitations and leg swelling.  Gastrointestinal: Negative.  Negative for abdominal  pain, diarrhea, nausea and vomiting.  Endocrine: Negative.   Genitourinary: Positive for hematuria.  Musculoskeletal: Negative.   Skin: Negative.  Negative for color change and rash.  Neurological: Negative.  Negative for weakness, numbness and headaches.  Psychiatric/Behavioral: Negative.     Allergies as of 06/04/2018   No Known Allergies     Medication List        Accurate as of 06/04/18  3:05 PM. Always use your most recent med list.          ALPRAZolam 0.5 MG tablet Commonly known as:  XANAX Take 1 tablet (0.5 mg total) by mouth 2 (two) times daily as needed for anxiety.   aspirin 325 MG tablet Take 325 mg by mouth daily.   atorvastatin 80 MG tablet Commonly known as:  LIPITOR Take 1 tablet (80 mg total) by mouth daily.   DULoxetine 60 MG capsule Commonly known as:  CYMBALTA Take 1 capsule (60 mg total) by mouth daily.   HYDROcodone-acetaminophen 5-325 MG tablet Commonly known as:   NORCO/VICODIN Take 1 tablet by mouth every 8 (eight) hours as needed for moderate pain.   Icosapent Ethyl 1 g Caps Commonly known as:  VASCEPA Take 2 g 2 (two) times daily by mouth.   nebivolol 5 MG tablet Commonly known as:  BYSTOLIC Take 1 tablet (5 mg total) by mouth daily.   Vitamin D 2000 units tablet Take 2,000 Units by mouth daily.          Objective:    BP 136/85   Pulse 96   Temp 97.6 F (36.4 C) (Oral)   Ht '5\' 11"'$  (1.803 m)   Wt 211 lb 3.2 oz (95.8 kg)   BMI 29.46 kg/m   No Known Allergies  Wt Readings from Last 3 Encounters:  06/04/18 211 lb 3.2 oz (95.8 kg)  05/07/18 223 lb (101.2 kg)  04/02/18 223 lb (101.2 kg)    Physical Exam  Constitutional: He appears well-developed and well-nourished. No distress.  HENT:  Head: Normocephalic and atraumatic.  Eyes: Pupils are equal, round, and reactive to light. Conjunctivae and EOM are normal.  Cardiovascular: Normal rate, regular rhythm and normal heart sounds.  Pulmonary/Chest: Effort normal and breath sounds normal. No respiratory distress.  Skin: Skin is warm and dry.  Psychiatric: He has a normal mood and affect. His behavior is normal.  Nursing note and vitals reviewed.   Results for orders placed or performed in visit on 04/02/18  CMP14+EGFR  Result Value Ref Range   Glucose 97 65 - 99 mg/dL   BUN 14 6 - 24 mg/dL   Creatinine, Ser 0.81 0.76 - 1.27 mg/dL   GFR calc non Af Amer 99 >59 mL/min/1.73   GFR calc Af Amer 115 >59 mL/min/1.73   BUN/Creatinine Ratio 17 9 - 20   Sodium 137 134 - 144 mmol/L   Potassium 4.2 3.5 - 5.2 mmol/L   Chloride 98 96 - 106 mmol/L   CO2 23 20 - 29 mmol/L   Calcium 9.1 8.7 - 10.2 mg/dL   Total Protein 6.7 6.0 - 8.5 g/dL   Albumin 3.7 3.5 - 5.5 g/dL   Globulin, Total 3.0 1.5 - 4.5 g/dL   Albumin/Globulin Ratio 1.2 1.2 - 2.2   Bilirubin Total 0.4 0.0 - 1.2 mg/dL   Alkaline Phosphatase 79 39 - 117 IU/L   AST 28 0 - 40 IU/L   ALT 32 0 - 44 IU/L  CBC with  Differential/Platelet  Result Value Ref Range  WBC 12.2 (H) 3.4 - 10.8 x10E3/uL   RBC 5.10 4.14 - 5.80 x10E6/uL   Hemoglobin 15.4 13.0 - 17.7 g/dL   Hematocrit 45.7 37.5 - 51.0 %   MCV 90 79 - 97 fL   MCH 30.2 26.6 - 33.0 pg   MCHC 33.7 31.5 - 35.7 g/dL   RDW 13.6 12.3 - 15.4 %   Platelets 238 150 - 379 x10E3/uL   Neutrophils 72 Not Estab. %   Lymphs 19 Not Estab. %   Monocytes 8 Not Estab. %   Eos 1 Not Estab. %   Basos 0 Not Estab. %   Neutrophils Absolute 8.7 (H) 1.4 - 7.0 x10E3/uL   Lymphocytes Absolute 2.3 0.7 - 3.1 x10E3/uL   Monocytes Absolute 1.0 (H) 0.1 - 0.9 x10E3/uL   EOS (ABSOLUTE) 0.1 0.0 - 0.4 x10E3/uL   Basophils Absolute 0.1 0.0 - 0.2 x10E3/uL   Immature Granulocytes 0 Not Estab. %   Immature Grans (Abs) 0.0 0.0 - 0.1 x10E3/uL   Hematology Comments: Note:   Lipid panel  Result Value Ref Range   Cholesterol, Total 197 100 - 199 mg/dL   Triglycerides 149 0 - 149 mg/dL   HDL 32 (L) >39 mg/dL   VLDL Cholesterol Cal 30 5 - 40 mg/dL   LDL Calculated 135 (H) 0 - 99 mg/dL   Chol/HDL Ratio 6.2 (H) 0.0 - 5.0 ratio  PSA, total and free  Result Value Ref Range   Prostate Specific Ag, Serum 0.8 0.0 - 4.0 ng/mL   PSA, Free 0.28 N/A ng/mL   PSA, Free Pct 35.0 %  Thyroid Panel With TSH  Result Value Ref Range   TSH 1.070 0.450 - 4.500 uIU/mL   T4, Total 7.0 4.5 - 12.0 ug/dL   T3 Uptake Ratio 27 24 - 39 %   Free Thyroxine Index 1.9 1.2 - 4.9  Bayer DCA Hb A1c Waived  Result Value Ref Range   HB A1C (BAYER DCA - WAIVED) 5.8 <7.0 %      Assessment & Plan:   1. Hematuria, unspecified type - Urinalysis, Complete - Ambulatory referral to Urology  2. Microscopic hematuria - Ambulatory referral to Urology  3. Depression with anxiety Continue meds\recheck 6 months   Continue all other maintenance medications as listed above.  Follow up plan: No follow-ups on file.  Educational handout given for [place] PA-C [facility] [address]  [place], [place] [address] [phone]   06/04/2018, 3:05 PM

## 2018-06-04 NOTE — Patient Instructions (Signed)

## 2018-06-18 DIAGNOSIS — N5201 Erectile dysfunction due to arterial insufficiency: Secondary | ICD-10-CM | POA: Diagnosis not present

## 2018-06-18 DIAGNOSIS — N400 Enlarged prostate without lower urinary tract symptoms: Secondary | ICD-10-CM | POA: Diagnosis not present

## 2018-06-18 DIAGNOSIS — R310 Gross hematuria: Secondary | ICD-10-CM | POA: Diagnosis not present

## 2018-06-25 ENCOUNTER — Encounter: Payer: Self-pay | Admitting: Physician Assistant

## 2018-06-25 DIAGNOSIS — R310 Gross hematuria: Secondary | ICD-10-CM | POA: Diagnosis not present

## 2018-06-30 MED FILL — SHIPPING COST: 1 days supply | Qty: 1 | Fill #6

## 2018-06-30 MED FILL — ATORVASTATIN 80 MG TABLET: 80 | 90 days supply | Qty: 90 | Fill #1

## 2018-07-01 ENCOUNTER — Other Ambulatory Visit: Payer: Self-pay | Admitting: *Deleted

## 2018-07-01 MED ORDER — ICOSAPENT ETHYL 1 G PO CAPS: 2.0000 g | ORAL_CAPSULE | Freq: Two times a day (BID) | ORAL | 3 refills | Status: DC

## 2018-07-01 MED FILL — SHIPPING COST: 1 days supply | Qty: 1 | Fill #7

## 2018-07-01 MED FILL — VASCEPA 1 GM CAPSULE: 1 | 90 days supply | Qty: 360 | Fill #0

## 2018-07-03 DIAGNOSIS — D414 Neoplasm of uncertain behavior of bladder: Secondary | ICD-10-CM | POA: Diagnosis not present

## 2018-07-07 ENCOUNTER — Other Ambulatory Visit: Payer: Self-pay | Admitting: Urology

## 2018-07-16 NOTE — Progress Notes (Signed)
04-02-18 (Epic) CXR

## 2018-07-16 NOTE — Patient Instructions (Addendum)
[name]  07/16/2018   Your procedure is scheduled on: 07-23-18   Report to [facility] Main  Entrance    Report to Admitting at 8:00 AM    Call this number if you have problems the morning of surgery [phone]   Remember: Do not eat food or drink liquids :After Midnight.     Take these medicines the morning of surgery with A SIP OF WATER: Alprazolam (Xanax), Duloxetine (Cymbalta), and Nebivolol (Bystolic)                                You may not have any metal on your body including hair pins and              piercings  Do not wear jewelry, lotions, powders, cologne or deodorant             Men may shave face and neck.   Do not bring valuables to the hospital. [place].  Contacts, dentures or bridgework may not be worn into surgery.      Patients discharged the day of surgery will not be allowed to drive home.  Name and phone number of your driver: [name] [phone]                Please read over the following fact sheets you were given: _____________________________________________________________________             [facility] - Preparing for Surgery Before surgery, you can play an important role.  Because skin is not sterile, your skin needs to be as free of germs as possible.  You can reduce the number of germs on your skin by washing with CHG (chlorahexidine gluconate) soap before surgery.  CHG is an antiseptic cleaner which kills germs and bonds with the skin to continue killing germs even after washing. Please DO NOT use if you have an allergy to CHG or antibacterial soaps.  If your skin becomes reddened/irritated stop using the CHG and inform your nurse when you arrive at Short Stay. Do not shave (including legs and underarms) for at least 48 hours prior to the first CHG shower.  You may shave your face/neck. Please follow these instructions carefully:  1.  Shower with CHG  Soap the night before surgery and the  morning of Surgery.  2.  If you choose to wash your hair, wash your hair first as usual with your  normal  shampoo.  3.  After you shampoo, rinse your hair and body thoroughly to remove the  shampoo.                           4.  Use CHG as you would any other liquid soap.  You can apply chg directly  to the skin and wash                       Gently with a scrungie or clean washcloth.  5.  Apply the CHG Soap to your body ONLY FROM THE NECK DOWN.   Do not use on face/ open  Wound or open sores. Avoid contact with eyes, ears mouth and genitals (private parts).                       Wash face,  Genitals (private parts) with your normal soap.             6.  Wash thoroughly, paying special attention to the area where your surgery  will be performed.  7.  Thoroughly rinse your body with warm water from the neck down.  8.  DO NOT shower/wash with your normal soap after using and rinsing off  the CHG Soap.                9.  Pat yourself dry with a clean towel.            10.  Wear clean pajamas.            11.  Place clean sheets on your bed the night of your first shower and do not  sleep with pets. Day of Surgery : Do not apply any lotions/deodorants the morning of surgery.  Please wear clean clothes to the hospital/surgery center.  FAILURE TO FOLLOW THESE INSTRUCTIONS MAY RESULT IN THE CANCELLATION OF YOUR SURGERY PATIENT SIGNATURE_________________________________  NURSE SIGNATURE__________________________________  ________________________________________________________________________

## 2018-07-17 ENCOUNTER — Encounter (HOSPITAL_COMMUNITY)
Admission: RE | Admit: 2018-07-17 | Discharge: 2018-07-17 | Disposition: A | Discharge status: Home / Self Care | Payer: 59 | Source: Ambulatory Visit | Attending: Urology | Admitting: Urology

## 2018-07-17 ENCOUNTER — Encounter (HOSPITAL_COMMUNITY): Payer: Self-pay

## 2018-07-17 ENCOUNTER — Other Ambulatory Visit: Payer: Self-pay

## 2018-07-17 DIAGNOSIS — D494 Neoplasm of unspecified behavior of bladder: Secondary | ICD-10-CM | POA: Diagnosis not present

## 2018-07-17 DIAGNOSIS — Z01810 Encounter for preprocedural cardiovascular examination: Secondary | ICD-10-CM | POA: Diagnosis not present

## 2018-07-17 DIAGNOSIS — I10 Essential (primary) hypertension: Secondary | ICD-10-CM | POA: Insufficient documentation

## 2018-07-17 DIAGNOSIS — Z01812 Encounter for preprocedural laboratory examination: Secondary | ICD-10-CM | POA: Diagnosis not present

## 2018-07-17 DIAGNOSIS — E119 Type 2 diabetes mellitus without complications: Secondary | ICD-10-CM | POA: Diagnosis not present

## 2018-07-17 HISTORY — DX: Personal history of urinary calculi: Z87.442

## 2018-07-17 LAB — CBC
HEMATOCRIT: 48.8 % (ref 39.0–52.0)
Hemoglobin: 16.3 g/dL (ref 13.0–17.0)
MCH: 30.5 pg (ref 26.0–34.0)
MCHC: 33.4 g/dL (ref 30.0–36.0)
MCV: 91.2 fL (ref 78.0–100.0)
PLATELETS: 190 10*3/uL (ref 150–400)
RBC: 5.35 MIL/uL (ref 4.22–5.81)
RDW: 13.1 % (ref 11.5–15.5)
WBC: 8.7 10*3/uL (ref 4.0–10.5)

## 2018-07-17 LAB — HEMOGLOBIN A1C
Hgb A1c MFr Bld: 6 % — ABNORMAL HIGH (ref 4.8–5.6)
Mean Plasma Glucose: 125.5 mg/dL

## 2018-07-17 LAB — BASIC METABOLIC PANEL
Anion gap: 9 (ref 5–15)
BUN: 16 mg/dL (ref 6–20)
CHLORIDE: 106 mmol/L (ref 98–111)
CO2: 26 mmol/L (ref 22–32)
CREATININE: 0.8 mg/dL (ref 0.61–1.24)
Calcium: 9.4 mg/dL (ref 8.9–10.3)
GFR calc non Af Amer: >60 mL/min (ref >60)
Glucose, Bld: 118 mg/dL — ABNORMAL HIGH (ref 70–99)
POTASSIUM: 4 mmol/L (ref 3.5–5.1)
SODIUM: 141 mmol/L (ref 135–145)

## 2018-07-23 ENCOUNTER — Encounter (HOSPITAL_COMMUNITY)
Admission: RE | Disposition: A | Discharge status: Home / Self Care | Payer: Self-pay | Source: Other Acute Inpatient Hospital | Attending: Urology

## 2018-07-23 ENCOUNTER — Ambulatory Visit (HOSPITAL_COMMUNITY): Payer: 59 | Admitting: Certified Registered Nurse Anesthetist

## 2018-07-23 ENCOUNTER — Ambulatory Visit (HOSPITAL_COMMUNITY)
Admission: RE | Admit: 2018-07-23 | Discharge: 2018-07-23 | Disposition: A | Discharge status: Home / Self Care | Payer: 59 | Source: Other Acute Inpatient Hospital | Attending: Urology | Admitting: Urology

## 2018-07-23 ENCOUNTER — Encounter (HOSPITAL_COMMUNITY): Payer: Self-pay | Admitting: *Deleted

## 2018-07-23 ENCOUNTER — Other Ambulatory Visit: Payer: Self-pay

## 2018-07-23 DIAGNOSIS — F329 Major depressive disorder, single episode, unspecified: Secondary | ICD-10-CM | POA: Insufficient documentation

## 2018-07-23 DIAGNOSIS — E119 Type 2 diabetes mellitus without complications: Secondary | ICD-10-CM | POA: Diagnosis not present

## 2018-07-23 DIAGNOSIS — C670 Malignant neoplasm of trigone of bladder: Secondary | ICD-10-CM | POA: Insufficient documentation

## 2018-07-23 DIAGNOSIS — D494 Neoplasm of unspecified behavior of bladder: Secondary | ICD-10-CM | POA: Diagnosis not present

## 2018-07-23 DIAGNOSIS — I10 Essential (primary) hypertension: Secondary | ICD-10-CM | POA: Diagnosis not present

## 2018-07-23 DIAGNOSIS — C672 Malignant neoplasm of lateral wall of bladder: Secondary | ICD-10-CM | POA: Diagnosis not present

## 2018-07-23 DIAGNOSIS — M1990 Unspecified osteoarthritis, unspecified site: Secondary | ICD-10-CM | POA: Insufficient documentation

## 2018-07-23 DIAGNOSIS — C679 Malignant neoplasm of bladder, unspecified: Secondary | ICD-10-CM | POA: Diagnosis not present

## 2018-07-23 DIAGNOSIS — I4892 Unspecified atrial flutter: Secondary | ICD-10-CM | POA: Diagnosis not present

## 2018-07-23 DIAGNOSIS — F419 Anxiety disorder, unspecified: Secondary | ICD-10-CM | POA: Diagnosis not present

## 2018-07-23 DIAGNOSIS — Z79899 Other long term (current) drug therapy: Secondary | ICD-10-CM | POA: Diagnosis not present

## 2018-07-23 DIAGNOSIS — E7800 Pure hypercholesterolemia, unspecified: Secondary | ICD-10-CM | POA: Insufficient documentation

## 2018-07-23 HISTORY — PX: TRANSURETHRAL RESECTION OF BLADDER TUMOR WITH MITOMYCIN-C: SHX6459

## 2018-07-23 LAB — GLUCOSE, CAPILLARY: Glucose-Capillary: 119 mg/dL — ABNORMAL HIGH (ref 70–99)

## 2018-07-23 SURGERY — TRANSURETHRAL RESECTION OF BLADDER TUMOR WITH MITOMYCIN-C
Anesthesia: General | Site: Bladder

## 2018-07-23 MED ORDER — ONDANSETRON HCL 4 MG/2ML IJ SOLN
INTRAMUSCULAR | Status: AC
  Filled 2018-07-23: qty 2

## 2018-07-23 MED ORDER — KETOROLAC TROMETHAMINE 30 MG/ML IJ SOLN: 30.0000 mg | Freq: Once | INTRAMUSCULAR | Status: DC | PRN

## 2018-07-23 MED ORDER — PROPOFOL 10 MG/ML IV BOLUS
INTRAVENOUS | Status: AC
  Filled 2018-07-23: qty 20

## 2018-07-23 MED ORDER — DEXAMETHASONE SODIUM PHOSPHATE 10 MG/ML IJ SOLN
INTRAMUSCULAR | Status: AC
  Filled 2018-07-23: qty 1

## 2018-07-23 MED ORDER — ALBUTEROL SULFATE HFA 108 (90 BASE) MCG/ACT IN AERS
INHALATION_SPRAY | RESPIRATORY_TRACT | Status: AC
  Filled 2018-07-23: qty 6.7

## 2018-07-23 MED ORDER — PROMETHAZINE HCL 25 MG/ML IJ SOLN: 6.2500 mg | INTRAMUSCULAR | Status: DC | PRN

## 2018-07-23 MED ORDER — CEFAZOLIN SODIUM-DEXTROSE 2-4 GM/100ML-% IV SOLN
INTRAVENOUS | Status: AC
  Filled 2018-07-23: qty 100

## 2018-07-23 MED ORDER — LIDOCAINE 2% (20 MG/ML) 5 ML SYRINGE
INTRAMUSCULAR | Status: AC
  Filled 2018-07-23: qty 5

## 2018-07-23 MED ORDER — LACTATED RINGERS IV SOLN
INTRAVENOUS | Status: DC
  Administered 2018-07-23 (×2): via INTRAVENOUS

## 2018-07-23 MED ORDER — LIDOCAINE 2% (20 MG/ML) 5 ML SYRINGE
INTRAMUSCULAR | Status: DC | PRN
  Administered 2018-07-23: 100 mg via INTRAVENOUS

## 2018-07-23 MED ORDER — MIDAZOLAM HCL 5 MG/5ML IJ SOLN
INTRAMUSCULAR | Status: DC | PRN
  Administered 2018-07-23 (×2): 1 mg via INTRAVENOUS

## 2018-07-23 MED ORDER — SUGAMMADEX SODIUM 200 MG/2ML IV SOLN
INTRAVENOUS | Status: AC
  Filled 2018-07-23: qty 2

## 2018-07-23 MED ORDER — FENTANYL CITRATE (PF) 100 MCG/2ML IJ SOLN
INTRAMUSCULAR | Status: AC
  Filled 2018-07-23: qty 2

## 2018-07-23 MED ORDER — OXYCODONE HCL 5 MG/5ML PO SOLN
5.0000 mg | Freq: Once | ORAL | Status: DC | PRN
  Filled 2018-07-23: qty 5

## 2018-07-23 MED ORDER — OXYCODONE HCL 5 MG PO TABS: 5.0000 mg | ORAL_TABLET | Freq: Once | ORAL | Status: DC | PRN

## 2018-07-23 MED ORDER — EPHEDRINE SULFATE-NACL 50-0.9 MG/10ML-% IV SOSY
PREFILLED_SYRINGE | INTRAVENOUS | Status: DC | PRN
  Administered 2018-07-23: 10 mg via INTRAVENOUS
  Administered 2018-07-23 (×2): 5 mg via INTRAVENOUS

## 2018-07-23 MED ORDER — TRAMADOL HCL 50 MG PO TABS: 50.0000 mg | ORAL_TABLET | Freq: Four times a day (QID) | ORAL | 0 refills | Status: DC | PRN

## 2018-07-23 MED ORDER — PROPOFOL 10 MG/ML IV BOLUS
INTRAVENOUS | Status: DC | PRN
  Administered 2018-07-23: 150 mg via INTRAVENOUS

## 2018-07-23 MED ORDER — MIDAZOLAM HCL 2 MG/2ML IJ SOLN
INTRAMUSCULAR | Status: AC
  Filled 2018-07-23: qty 2

## 2018-07-23 MED ORDER — SODIUM CHLORIDE 0.9 % IR SOLN
Status: DC | PRN
  Administered 2018-07-23 (×2): 3000 mL via INTRAVESICAL

## 2018-07-23 MED ORDER — FENTANYL CITRATE (PF) 100 MCG/2ML IJ SOLN
INTRAMUSCULAR | Status: AC
  Administered 2018-07-23: 50 ug via INTRAVENOUS
  Filled 2018-07-23: qty 4

## 2018-07-23 MED ORDER — ONDANSETRON HCL 4 MG/2ML IJ SOLN
INTRAMUSCULAR | Status: DC | PRN
  Administered 2018-07-23: 4 mg via INTRAVENOUS

## 2018-07-23 MED ORDER — DEXAMETHASONE SODIUM PHOSPHATE 10 MG/ML IJ SOLN
INTRAMUSCULAR | Status: DC | PRN
  Administered 2018-07-23: 8 mg via INTRAVENOUS

## 2018-07-23 MED ORDER — CEFAZOLIN SODIUM-DEXTROSE 2-4 GM/100ML-% IV SOLN
2.0000 g | INTRAVENOUS | Status: AC
  Administered 2018-07-23: 2 g via INTRAVENOUS

## 2018-07-23 MED ORDER — SUCCINYLCHOLINE CHLORIDE 200 MG/10ML IV SOSY
PREFILLED_SYRINGE | INTRAVENOUS | Status: DC | PRN
  Administered 2018-07-23: 100 mg via INTRAVENOUS

## 2018-07-23 MED ORDER — SUGAMMADEX SODIUM 200 MG/2ML IV SOLN
INTRAVENOUS | Status: DC | PRN
  Administered 2018-07-23: 200 mg via INTRAVENOUS

## 2018-07-23 MED ORDER — FENTANYL CITRATE (PF) 100 MCG/2ML IJ SOLN
25.0000 ug | INTRAMUSCULAR | Status: DC | PRN
  Administered 2018-07-23 (×3): 50 ug via INTRAVENOUS

## 2018-07-23 MED ORDER — ROCURONIUM BROMIDE 50 MG/5ML IV SOSY
PREFILLED_SYRINGE | INTRAVENOUS | Status: DC | PRN
  Administered 2018-07-23: 30 mg via INTRAVENOUS

## 2018-07-23 MED ORDER — ALBUTEROL SULFATE HFA 108 (90 BASE) MCG/ACT IN AERS
INHALATION_SPRAY | RESPIRATORY_TRACT | Status: DC | PRN
  Administered 2018-07-23 (×2): 5 via RESPIRATORY_TRACT

## 2018-07-23 MED ORDER — EPHEDRINE 5 MG/ML INJ
INTRAVENOUS | Status: AC
  Filled 2018-07-23: qty 10

## 2018-07-23 MED ORDER — GEMCITABINE CHEMO FOR BLADDER INSTILLATION 2000 MG
2000.0000 mg | Freq: Once | INTRAVENOUS | Status: AC
  Administered 2018-07-23: 2000 mg via INTRAVESICAL
  Filled 2018-07-23: qty 2000

## 2018-07-23 MED ORDER — FENTANYL CITRATE (PF) 100 MCG/2ML IJ SOLN
INTRAMUSCULAR | Status: DC | PRN
  Administered 2018-07-23 (×2): 50 ug via INTRAVENOUS

## 2018-07-23 SURGICAL SUPPLY — 11 items
CATH FOLEY 2WAY SLVR  5CC 20FR (CATHETERS) ×2
CATH FOLEY 2WAY SLVR 5CC 20FR (CATHETERS) IMPLANT
COVER SURGICAL LIGHT HANDLE (MISCELLANEOUS) ×1 IMPLANT
GLOVE BIO SURGEON STRL SZ7.5 (GLOVE) ×2 IMPLANT
GOWN STRL REUS W/TWL XL LVL3 (GOWN DISPOSABLE) ×2 IMPLANT
INST BIOPSY MAXCORE 18GX25 (NEEDLE) IMPLANT
INSTR BIOPSY MAXCORE 18GX20 (NEEDLE) IMPLANT
LOOP CUT BIPOLAR 24F LRG (ELECTROSURGICAL) ×2 IMPLANT
PACK CYSTO (CUSTOM PROCEDURE TRAY) ×2 IMPLANT
SYR CONTROL 10ML LL (SYRINGE) IMPLANT
UNDERPAD 30X30 (UNDERPADS AND DIAPERS) ×1 IMPLANT

## 2018-07-23 NOTE — Anesthesia Postprocedure Evaluation (Signed)
Anesthesia Post Note  Patient: [name]  Procedure(s) Performed: TRANSURETHRAL RESECTION OF BLADDER TUMOR WITH GEMCITABINE (N/A Bladder)     Patient location during evaluation: PACU Anesthesia Type: General Level of consciousness: awake and alert Pain management: pain level controlled Vital Signs Assessment: post-procedure vital signs reviewed and stable Respiratory status: spontaneous breathing, nonlabored ventilation, respiratory function stable and patient connected to nasal cannula oxygen Cardiovascular status: blood pressure returned to baseline and stable Postop Assessment: no apparent nausea or vomiting Anesthetic complications: no    Last Vitals:  Vitals:   07/23/18 1215 07/23/18 1230  BP: 135/89 (!) 151/89  Pulse: 72 77  Resp: 15 12  Temp:    SpO2: 92% 95%    Last Pain:  Vitals:   07/23/18 1215  TempSrc:   PainSc: Asleep                 Zaia Carre S

## 2018-07-23 NOTE — Anesthesia Procedure Notes (Signed)
Procedure Name: Intubation Date/Time: 07/23/2018 10:32 AM Performed by: Maxwell Caul, CRNA Pre-anesthesia Checklist: Patient identified, Emergency Drugs available, Suction available and Patient being monitored Patient Re-evaluated:Patient Re-evaluated prior to induction Oxygen Delivery Method: Circle system utilized Preoxygenation: Pre-oxygenation with 100% oxygen Induction Type: IV induction Ventilation: Mask ventilation without difficulty Laryngoscope Size: Mac and 4 Grade View: Grade II Tube type: Oral Tube size: 7.5 mm Number of attempts: 1 Airway Equipment and Method: Stylet and Oral airway Placement Confirmation: ETT inserted through vocal cords under direct vision,  positive ETCO2 and breath sounds checked- equal and bilateral Secured at: 21 cm Tube secured with: Tape Dental Injury: Teeth and Oropharynx as per pre-operative assessment

## 2018-07-23 NOTE — Anesthesia Preprocedure Evaluation (Signed)
Anesthesia Evaluation  Patient identified by MRN, date of birth, ID band Patient awake    Reviewed: Allergy & Precautions, NPO status , Patient's Chart, lab work & pertinent test results  Airway Mallampati: II  TM Distance: >3 FB Neck ROM: Full    Dental no notable dental hx.    Pulmonary neg pulmonary ROS, former smoker,    Pulmonary exam normal breath sounds clear to auscultation       Cardiovascular hypertension, Normal cardiovascular exam Rhythm:Regular Rate:Normal     Neuro/Psych Anxiety negative neurological ROS     GI/Hepatic negative GI ROS, Neg liver ROS,   Endo/Other  diabetes  Renal/GU negative Renal ROS  negative genitourinary   Musculoskeletal negative musculoskeletal ROS (+)   Abdominal   Peds negative pediatric ROS (+)  Hematology negative hematology ROS (+)   Anesthesia Other Findings   Reproductive/Obstetrics negative OB ROS                             Anesthesia Physical Anesthesia Plan  ASA: II  Anesthesia Plan: General   Post-op Pain Management:    Induction: Intravenous  PONV Risk Score and Plan: 3 and Ondansetron, Dexamethasone and Treatment may vary due to age or medical condition  Airway Management Planned: LMA and Oral ETT  Additional Equipment:   Intra-op Plan:   Post-operative Plan: Extubation in OR  Informed Consent: I have reviewed the patients History and Physical, chart, labs and discussed the procedure including the risks, benefits and alternatives for the proposed anesthesia with the patient or authorized representative who has indicated his/her understanding and acceptance.   Dental advisory given  Plan Discussed with: CRNA and Surgeon  Anesthesia Plan Comments:         Anesthesia Quick Evaluation

## 2018-07-23 NOTE — Interval H&P Note (Signed)
History and Physical Interval Note:  07/23/2018 9:49 AM  [name]  has presented today for surgery, with the diagnosis of BLADDER TUMOR  The various methods of treatment have been discussed with the patient and family. After consideration of risks, benefits and other options for treatment, the patient has consented to  Procedure(s): TRANSURETHRAL RESECTION OF BLADDER TUMOR WITH GEMCITABINE (N/A) as a surgical intervention .  The patient's history has been reviewed, patient examined, no change in status, stable for surgery.  I have reviewed the patient's chart and labs.  Questions were answered to the patient's satisfaction.     Marton Redwood, III

## 2018-07-23 NOTE — Transfer of Care (Signed)
Immediate Anesthesia Transfer of Care Note  Patient: [name]  Procedure(s) Performed: TRANSURETHRAL RESECTION OF BLADDER TUMOR WITH GEMCITABINE (N/A Bladder)  Patient Location: PACU  Anesthesia Type:General  Level of Consciousness: awake, alert  and oriented  Airway & Oxygen Therapy: Patient Spontanous Breathing and Patient connected to face mask oxygen  Post-op Assessment: Report given to RN and Post -op Vital signs reviewed and stable  Post vital signs: Reviewed and stable  Last Vitals:  Vitals Value Taken Time  BP 154/82 07/23/2018 11:21 AM  Temp    Pulse 82 07/23/2018 11:22 AM  Resp 14 07/23/2018 11:22 AM  SpO2 94 % 07/23/2018 11:22 AM  Vitals shown include unvalidated device data.  Last Pain:  Vitals:   07/23/18 0757  TempSrc: Oral      Patients Stated Pain Goal: 3 (90/30/09 2330)  Complications: No apparent anesthesia complications

## 2018-07-23 NOTE — H&P (Signed)
CC/HPI: Cc: Bladder tumor  HPI: A 57 year old male was evaluated by Dr. Pilar Jarvis for gross hematuria. He underwent a CT IVP which revealed a 2 cm bladder mass. No other abnormalities. No hydronephrosis or kidney or ureteral tumors. The patient has had no further gross hematuria. He presents today for cystoscopy. No other complaints.     ALLERGIES: None   MEDICATIONS: Aspirin 325 mg tablet  Alprazolam 0.5 mg tablet  Atorvastatin Calcium 80 mg tablet  Duloxetine Hcl 60 mg capsule,delayed release  Hydrocodone-Acetaminophen 5 mg-325 mg tablet  Icosapent Ethyl  Nebivolol 1 PO Daily  Sildenafil 20 mg tablet Take 1 to 5 tabs PO daily prn  Vitamin D2 2,000 unit tablet     GU PSH: Locm 300-399Mg /Ml Iodine,1Ml - 06/25/2018    NON-GU PSH: Fusion Spine Tonsillectomy    GU PMH: BPH w/o LUTS - 06/18/2018 ED due to arterial insufficiency - 06/18/2018 Gross hematuria - 06/18/2018    NON-GU PMH: Anxiety Arthritis Depression Diabetes Type 2 Hypercholesterolemia    FAMILY HISTORY: Heart Attack - Father   SOCIAL HISTORY: Marital Status: Single Ethnicity: Not Hispanic Or Latino; Race: White Current Smoking Status: Patient does not smoke anymore. Has not smoked since 05/26/2013.   Tobacco Use Assessment Completed: Used Tobacco in last 30 days? Does drink.  Drinks 1 caffeinated drink per day.    REVIEW OF SYSTEMS:    GU Review Male:   Patient denies frequent urination, hard to postpone urination, burning/ pain with urination, get up at night to urinate, leakage of urine, stream starts and stops, trouble starting your stream, have to strain to urinate , erection problems, and penile pain.  Gastrointestinal (Upper):   Patient denies nausea, vomiting, and indigestion/ heartburn.  Gastrointestinal (Lower):   Patient denies diarrhea and constipation.  Constitutional:   Patient denies fever, night sweats, weight loss, and fatigue.  Skin:   Patient denies skin rash/ lesion and itching.  Eyes:    Patient denies double vision and blurred vision.  Ears/ Nose/ Throat:   Patient denies sore throat and sinus problems.  Hematologic/Lymphatic:   Patient denies swollen glands and easy bruising.  Cardiovascular:   Patient denies leg swelling and chest pains.  Respiratory:   Patient denies cough and shortness of breath.  Endocrine:   Patient denies excessive thirst.  Musculoskeletal:   Patient denies back pain and joint pain.  Neurological:   Patient denies headaches and dizziness.  Psychologic:   Patient denies depression and anxiety.   Notes: discomfort R side    VITAL SIGNS:      07/03/2018 04:21 PM  BP 134/77 mmHg  Heart Rate 66 /min  Temperature 98.6 F / 37 C   MULTI-SYSTEM PHYSICAL EXAMINATION:    Constitutional: Well-nourished. No physical deformities. Normally developed. Good grooming.  Respiratory: No labored breathing, no use of accessory muscles.   Cardiovascular: Normal temperature, adequate perfusion of extremities  Skin: No paleness, no jaundice  Neurologic / Psychiatric: Oriented to time, oriented to place, oriented to person. No depression, no anxiety, no agitation.  Gastrointestinal: No mass, no tenderness, no rigidity, non obese abdomen.  Eyes: Normal conjunctivae. Normal eyelids.  Musculoskeletal: Normal gait and station of head and neck.     PAST DATA REVIEWED:  Source Of History:  Patient  X-Ray Review: C.T. Abdomen/Pelvis: Reviewed Films. Reviewed Report. Discussed With Patient.     PROCEDURES:         Flexible Cystoscopy - 52000  Risks, benefits, and some of the potential complications of the procedure  were discussed at length with the patient including infection, bleeding, voiding discomfort, urinary retention, fever, chills, sepsis, and others. All questions were answered. Informed consent was obtained. Antibiotic prophylaxis was given. Sterile technique and intraurethral analgesia were used.  Meatus:  Normal size. Normal location. Normal condition.   Urethra:  No strictures.  External Sphincter:  Normal.  Verumontanum:  Normal.  Prostate:  Borderline obstructing. Mild hyperplasia.  Bladder Neck:  Non-obstructing.  Ureteral Orifices:  Normal location. Normal size. Normal shape. Effluxed clear urine.  Bladder:  No trabeculation. 2 cm left lateral wall tumor appears superficial and low-grade      The lower urinary tract was carefully examined. The procedure was well-tolerated and without complications. Antibiotic instructions were given. Instructions were given to call the office immediately for bloody urine, difficulty urinating, urinary retention, painful or frequent urination, fever, chills, nausea, vomiting or other illness. The patient stated that he understood these instructions and would comply with them.         Urinalysis w/Scope Dipstick Dipstick Cont'd Micro  Color: Yellow Bilirubin: Neg mg/dL WBC/hpf: NS (Not Seen)  Appearance: Clear Ketones: Neg mg/dL RBC/hpf: 3 - 10/hpf  Specific Gravity: 1.025 Blood: 3+ ery/uL Bacteria: NS (Not Seen)  pH: 6.0 Protein: 3+ mg/dL Cystals: NS (Not Seen)  Glucose: Neg mg/dL Urobilinogen: 0.2 mg/dL Casts: NS (Not Seen)    Nitrites: Neg Trichomonas: Not Present    Leukocyte Esterase: Neg leu/uL Mucous: Not Present      Epithelial Cells: 0 - 5/hpf      Yeast: NS (Not Seen)      Sperm: Not Present    ASSESSMENT:      ICD-10 Details  1 GU:   Bladder tumor/neoplasm - D41.4    PLAN:           Document Letter(s):  Created for Patient: Clinical Summary         Notes:   Plan to proceed with transurethral resection of bladder tumor with instillation of gemcitabine. He understands potential risks and complications including but not limited to bleeding, infection, injury to surrounding structures including risk of bladder perforation, need for additional procedures.  Signed by Link Snuffer, III, M.D. on 07/03/18 at 5:01 PM (EDT

## 2018-07-23 NOTE — Discharge Instructions (Signed)

## 2018-07-23 NOTE — Op Note (Signed)
Operative Note  Preoperative diagnosis:  1.  Bladder tumor--medium  Postoperative diagnosis: 1.  Bladder tumor--medium--3 cm  Procedure(s): 1.  Cystoscopy with urethral dilation 2.  Transurethral resection of bladder tumor--medium 3.  Instillation of intravesical chemotherapy--gemcitabine  Surgeon: Link Snuffer, MD  Assistants: None  Anesthesia: General  Complications: None immediate  EBL: Minimal  Specimens: 1.  Bladder tumor  Drains/Catheters: 1.  18 French urethral catheter  Intraoperative findings: 1.  Normal urethra 2.  Small subcentimeter bladder tumor at the trigone.  Larger 3 cm bladder tumor on the right lateral wall.  This appeared to be completely resected down to muscle fiber.  Indication: 57 year old male who was found to have a bladder tumor presents for the previously mentioned operation  Description of procedure:  The patient was identified and consent was obtained.  The patient was taken to the operating room and placed in the supine position.  The patient was placed under general anesthesia.  Perioperative antibiotics were administered.  The patient was placed in dorsal lithotomy.  Patient was prepped and draped in a standard sterile fashion and a timeout was performed.  A 26 French resectoscope with the visual obturator in place attempted be passed into the urethra but there was a small amount of resistance.  I therefore carefully dilated up to 65 [place] the urethra.  I was unable to easily pass the resectoscope with the visual obturator in place into the urethra and into the bladder.  I exchanged this for the working element and identified the bladder tumors of interest which were resected on bipolar settings followed by fulguration of the resection bed and surrounding areas.  Bilateral ureteral orifices did not appear to be involved.  I collected the bladder chips for specimen.  I then reinspected the bladder and there is no active bleeding noted.  I withdrew  the scope and placed an 60 French urethral catheter.  Patient tolerated the procedure well and was stable postoperatively.  In the PACU, I instilled intravesical chemotherapy--gemcitabine-- into the bladder and this remained in the bladder for 2 hours.  Plan: Return in about 1 week for pathology review.

## 2018-07-24 ENCOUNTER — Encounter (HOSPITAL_COMMUNITY): Payer: Self-pay | Admitting: Urology

## 2018-08-01 DIAGNOSIS — C672 Malignant neoplasm of lateral wall of bladder: Secondary | ICD-10-CM | POA: Diagnosis not present

## 2018-08-19 DIAGNOSIS — Z5111 Encounter for antineoplastic chemotherapy: Secondary | ICD-10-CM | POA: Diagnosis not present

## 2018-08-19 DIAGNOSIS — C672 Malignant neoplasm of lateral wall of bladder: Secondary | ICD-10-CM | POA: Diagnosis not present

## 2018-08-21 MED FILL — DULoxetine HCL 60 MG CPEP: 60 | 90 days supply | Qty: 90 | Fill #1

## 2018-08-21 MED FILL — SHIPPING COST: 1 days supply | Qty: 1 | Fill #8

## 2018-08-26 ENCOUNTER — Other Ambulatory Visit: Payer: Self-pay | Admitting: Physician Assistant

## 2018-08-26 ENCOUNTER — Telehealth: Payer: Self-pay | Admitting: *Deleted

## 2018-08-26 DIAGNOSIS — C672 Malignant neoplasm of lateral wall of bladder: Secondary | ICD-10-CM | POA: Diagnosis not present

## 2018-08-26 DIAGNOSIS — Z5111 Encounter for antineoplastic chemotherapy: Secondary | ICD-10-CM | POA: Diagnosis not present

## 2018-08-26 DIAGNOSIS — M5136 Other intervertebral disc degeneration, lumbar region: Secondary | ICD-10-CM

## 2018-08-26 MED ORDER — HYDROCODONE-ACETAMINOPHEN 5-325 MG PO TABS: 1.0000 | ORAL_TABLET | Freq: Three times a day (TID) | ORAL | 0 refills | Status: DC | PRN

## 2018-08-26 NOTE — Telephone Encounter (Signed)
Sent!

## 2018-08-26 NOTE — Telephone Encounter (Signed)
Patient is requesting a refill on hydrocodone.

## 2018-08-26 NOTE — Telephone Encounter (Signed)
Patient aware.

## 2018-09-02 DIAGNOSIS — C672 Malignant neoplasm of lateral wall of bladder: Secondary | ICD-10-CM | POA: Diagnosis not present

## 2018-09-02 DIAGNOSIS — Z5111 Encounter for antineoplastic chemotherapy: Secondary | ICD-10-CM | POA: Diagnosis not present

## 2018-09-09 DIAGNOSIS — C672 Malignant neoplasm of lateral wall of bladder: Secondary | ICD-10-CM | POA: Diagnosis not present

## 2018-09-09 DIAGNOSIS — Z5111 Encounter for antineoplastic chemotherapy: Secondary | ICD-10-CM | POA: Diagnosis not present

## 2018-09-16 DIAGNOSIS — C672 Malignant neoplasm of lateral wall of bladder: Secondary | ICD-10-CM | POA: Diagnosis not present

## 2018-09-16 DIAGNOSIS — Z5111 Encounter for antineoplastic chemotherapy: Secondary | ICD-10-CM | POA: Diagnosis not present

## 2018-09-23 DIAGNOSIS — C672 Malignant neoplasm of lateral wall of bladder: Secondary | ICD-10-CM | POA: Diagnosis not present

## 2018-09-23 DIAGNOSIS — Z5111 Encounter for antineoplastic chemotherapy: Secondary | ICD-10-CM | POA: Diagnosis not present

## 2018-09-25 ENCOUNTER — Other Ambulatory Visit: Payer: Self-pay | Admitting: *Deleted

## 2018-09-25 MED ORDER — ATORVASTATIN CALCIUM 80 MG PO TABS: 80.0000 mg | ORAL_TABLET | Freq: Every day | ORAL | 1 refills | Status: DC

## 2018-09-25 MED FILL — ATORVASTATIN 80 MG TABLET: 80 | 90 days supply | Qty: 90 | Fill #0

## 2018-09-25 MED FILL — SHIPPING COST: 1 days supply | Qty: 1 | Fill #9

## 2018-10-22 ENCOUNTER — Other Ambulatory Visit: Payer: Self-pay | Admitting: *Deleted

## 2018-10-22 ENCOUNTER — Other Ambulatory Visit: Payer: Self-pay | Admitting: Physician Assistant

## 2018-10-22 DIAGNOSIS — M5136 Other intervertebral disc degeneration, lumbar region: Secondary | ICD-10-CM

## 2018-10-22 MED ORDER — HYDROCODONE-ACETAMINOPHEN 5-325 MG PO TABS: 1.0000 | ORAL_TABLET | Freq: Three times a day (TID) | ORAL | 0 refills | Status: DC | PRN

## 2018-10-22 NOTE — Telephone Encounter (Signed)
Patient aware.

## 2018-10-22 NOTE — Telephone Encounter (Signed)
sent 

## 2018-10-22 NOTE — Telephone Encounter (Signed)
Patient is requesting a refill on hydrocodone.

## 2018-10-24 MED FILL — SHIPPING COST: 1 days supply | Qty: 1 | Fill #10

## 2018-10-24 MED FILL — BYSTOLIC 5 MG TABLET: 5 | 90 days supply | Qty: 90 | Fill #1

## 2018-10-24 MED FILL — VASCEPA 1 GM CAPSULE: 1 | 90 days supply | Qty: 360 | Fill #1

## 2018-11-14 DIAGNOSIS — C672 Malignant neoplasm of lateral wall of bladder: Secondary | ICD-10-CM | POA: Diagnosis not present

## 2018-11-21 ENCOUNTER — Other Ambulatory Visit: Payer: Self-pay | Admitting: Urology

## 2018-12-08 ENCOUNTER — Encounter (HOSPITAL_BASED_OUTPATIENT_CLINIC_OR_DEPARTMENT_OTHER): Payer: Self-pay | Admitting: *Deleted

## 2018-12-09 ENCOUNTER — Encounter (HOSPITAL_BASED_OUTPATIENT_CLINIC_OR_DEPARTMENT_OTHER): Payer: Self-pay | Admitting: *Deleted

## 2018-12-09 ENCOUNTER — Other Ambulatory Visit: Payer: Self-pay

## 2018-12-09 NOTE — Progress Notes (Signed)
Spoke w/ pt via phone for pre-op interview.  Npo after mn.  Arrive at 1015.  Needs istat.  Current ekg in chart and epic. Will take bystolic, lipitor, and cymbalta am dos w/ sips of water.

## 2018-12-19 ENCOUNTER — Ambulatory Visit (HOSPITAL_BASED_OUTPATIENT_CLINIC_OR_DEPARTMENT_OTHER)
Admission: RE | Admit: 2018-12-19 | Discharge: 2018-12-19 | Disposition: A | Discharge status: Home / Self Care | Payer: Medicare Other | Attending: Urology | Admitting: Urology

## 2018-12-19 ENCOUNTER — Encounter (HOSPITAL_BASED_OUTPATIENT_CLINIC_OR_DEPARTMENT_OTHER): Payer: Self-pay

## 2018-12-19 ENCOUNTER — Encounter (HOSPITAL_BASED_OUTPATIENT_CLINIC_OR_DEPARTMENT_OTHER)
Admission: RE | Disposition: A | Discharge status: Home / Self Care | Payer: Self-pay | Source: Home / Self Care | Attending: Urology

## 2018-12-19 ENCOUNTER — Ambulatory Visit (HOSPITAL_BASED_OUTPATIENT_CLINIC_OR_DEPARTMENT_OTHER): Payer: Medicare Other | Admitting: Anesthesiology

## 2018-12-19 DIAGNOSIS — C672 Malignant neoplasm of lateral wall of bladder: Secondary | ICD-10-CM | POA: Insufficient documentation

## 2018-12-19 DIAGNOSIS — Z8249 Family history of ischemic heart disease and other diseases of the circulatory system: Secondary | ICD-10-CM | POA: Insufficient documentation

## 2018-12-19 DIAGNOSIS — F419 Anxiety disorder, unspecified: Secondary | ICD-10-CM | POA: Insufficient documentation

## 2018-12-19 DIAGNOSIS — Z79899 Other long term (current) drug therapy: Secondary | ICD-10-CM | POA: Diagnosis not present

## 2018-12-19 DIAGNOSIS — Z87891 Personal history of nicotine dependence: Secondary | ICD-10-CM | POA: Insufficient documentation

## 2018-12-19 DIAGNOSIS — Z7982 Long term (current) use of aspirin: Secondary | ICD-10-CM | POA: Insufficient documentation

## 2018-12-19 DIAGNOSIS — M1990 Unspecified osteoarthritis, unspecified site: Secondary | ICD-10-CM | POA: Diagnosis not present

## 2018-12-19 DIAGNOSIS — F329 Major depressive disorder, single episode, unspecified: Secondary | ICD-10-CM | POA: Insufficient documentation

## 2018-12-19 DIAGNOSIS — E7800 Pure hypercholesterolemia, unspecified: Secondary | ICD-10-CM | POA: Insufficient documentation

## 2018-12-19 DIAGNOSIS — I10 Essential (primary) hypertension: Secondary | ICD-10-CM | POA: Insufficient documentation

## 2018-12-19 DIAGNOSIS — D494 Neoplasm of unspecified behavior of bladder: Secondary | ICD-10-CM | POA: Diagnosis present

## 2018-12-19 DIAGNOSIS — E119 Type 2 diabetes mellitus without complications: Secondary | ICD-10-CM | POA: Diagnosis not present

## 2018-12-19 HISTORY — DX: Malignant neoplasm of bladder, unspecified: C67.9

## 2018-12-19 HISTORY — DX: Personal history of other specified conditions: Z87.898

## 2018-12-19 HISTORY — DX: Palpitations: R00.2

## 2018-12-19 HISTORY — DX: Essential (primary) hypertension: I10

## 2018-12-19 HISTORY — DX: Personal history of other diseases of the digestive system: Z87.19

## 2018-12-19 HISTORY — DX: Neoplasm of unspecified behavior of bladder: D49.4

## 2018-12-19 HISTORY — DX: Type 2 diabetes mellitus without complications: E11.9

## 2018-12-19 HISTORY — DX: Atrioventricular block, first degree: I44.0

## 2018-12-19 HISTORY — PX: TRANSURETHRAL RESECTION OF BLADDER TUMOR WITH MITOMYCIN-C: SHX6459

## 2018-12-19 LAB — POCT I-STAT 4, (NA,K, GLUC, HGB,HCT)
Glucose, Bld: 118 mg/dL — ABNORMAL HIGH (ref 70–99)
HEMATOCRIT: 50 % (ref 39.0–52.0)
HEMOGLOBIN: 17 g/dL (ref 13.0–17.0)
Potassium: 4 mmol/L (ref 3.5–5.1)
Sodium: 141 mmol/L (ref 135–145)

## 2018-12-19 SURGERY — TRANSURETHRAL RESECTION OF BLADDER TUMOR WITH MITOMYCIN-C
Anesthesia: General

## 2018-12-19 MED ORDER — FENTANYL CITRATE (PF) 100 MCG/2ML IJ SOLN
INTRAMUSCULAR | Status: AC
  Filled 2018-12-19: qty 4

## 2018-12-19 MED ORDER — CEFAZOLIN SODIUM-DEXTROSE 2-4 GM/100ML-% IV SOLN
INTRAVENOUS | Status: AC
  Filled 2018-12-19: qty 100

## 2018-12-19 MED ORDER — KETOROLAC TROMETHAMINE 30 MG/ML IJ SOLN
30.0000 mg | Freq: Once | INTRAMUSCULAR | Status: DC | PRN
  Filled 2018-12-19: qty 1

## 2018-12-19 MED ORDER — ONDANSETRON HCL 4 MG/2ML IJ SOLN
INTRAMUSCULAR | Status: DC | PRN
  Administered 2018-12-19: 4 mg via INTRAVENOUS

## 2018-12-19 MED ORDER — PROPOFOL 10 MG/ML IV BOLUS
INTRAVENOUS | Status: DC | PRN
  Administered 2018-12-19: 200 mg via INTRAVENOUS

## 2018-12-19 MED ORDER — DEXAMETHASONE SODIUM PHOSPHATE 10 MG/ML IJ SOLN
INTRAMUSCULAR | Status: AC
  Filled 2018-12-19: qty 1

## 2018-12-19 MED ORDER — KETOROLAC TROMETHAMINE 30 MG/ML IJ SOLN
INTRAMUSCULAR | Status: AC
  Filled 2018-12-19: qty 1

## 2018-12-19 MED ORDER — LIDOCAINE HCL URETHRAL/MUCOSAL 2 % EX GEL
CUTANEOUS | Status: AC
  Filled 2018-12-19: qty 5

## 2018-12-19 MED ORDER — LIDOCAINE HCL URETHRAL/MUCOSAL 2 % EX GEL
CUTANEOUS | Status: DC | PRN
  Administered 2018-12-19: 1 via URETHRAL

## 2018-12-19 MED ORDER — CEFAZOLIN SODIUM-DEXTROSE 2-4 GM/100ML-% IV SOLN
2.0000 g | INTRAVENOUS | Status: AC
  Administered 2018-12-19: 2 g via INTRAVENOUS
  Filled 2018-12-19: qty 100

## 2018-12-19 MED ORDER — DEXAMETHASONE SODIUM PHOSPHATE 10 MG/ML IJ SOLN
INTRAMUSCULAR | Status: DC | PRN
  Administered 2018-12-19: 10 mg via INTRAVENOUS

## 2018-12-19 MED ORDER — SODIUM CHLORIDE 0.9 % IR SOLN
Status: DC | PRN
  Administered 2018-12-19: 3000 mL via INTRAVESICAL

## 2018-12-19 MED ORDER — LIDOCAINE 2% (20 MG/ML) 5 ML SYRINGE
INTRAMUSCULAR | Status: AC
  Filled 2018-12-19: qty 5

## 2018-12-19 MED ORDER — MIDAZOLAM HCL 2 MG/2ML IJ SOLN
INTRAMUSCULAR | Status: AC
  Filled 2018-12-19: qty 2

## 2018-12-19 MED ORDER — ONDANSETRON HCL 4 MG/2ML IJ SOLN
INTRAMUSCULAR | Status: AC
  Filled 2018-12-19: qty 2

## 2018-12-19 MED ORDER — PROMETHAZINE HCL 25 MG/ML IJ SOLN
6.2500 mg | INTRAMUSCULAR | Status: DC | PRN
  Filled 2018-12-19: qty 1

## 2018-12-19 MED ORDER — BELLADONNA ALKALOIDS-OPIUM 16.2-60 MG RE SUPP
RECTAL | Status: DC | PRN
  Administered 2018-12-19: 1 via RECTAL

## 2018-12-19 MED ORDER — OXYCODONE HCL 5 MG PO TABS
5.0000 mg | ORAL_TABLET | Freq: Once | ORAL | Status: DC | PRN
  Filled 2018-12-19: qty 1

## 2018-12-19 MED ORDER — FENTANYL CITRATE (PF) 100 MCG/2ML IJ SOLN
INTRAMUSCULAR | Status: DC | PRN
  Administered 2018-12-19: 100 ug via INTRAVENOUS

## 2018-12-19 MED ORDER — MIDAZOLAM HCL 2 MG/2ML IJ SOLN
INTRAMUSCULAR | Status: DC | PRN
  Administered 2018-12-19: 2 mg via INTRAVENOUS

## 2018-12-19 MED ORDER — GEMCITABINE CHEMO FOR BLADDER INSTILLATION 2000 MG
2000.0000 mg | Freq: Once | INTRAVENOUS | Status: AC
  Administered 2018-12-19: 2000 mg via INTRAVESICAL
  Filled 2018-12-19: qty 2000

## 2018-12-19 MED ORDER — BELLADONNA ALKALOIDS-OPIUM 16.2-60 MG RE SUPP
RECTAL | Status: AC
  Filled 2018-12-19: qty 1

## 2018-12-19 MED ORDER — FENTANYL CITRATE (PF) 100 MCG/2ML IJ SOLN
INTRAMUSCULAR | Status: AC
  Filled 2018-12-19: qty 2

## 2018-12-19 MED ORDER — OXYCODONE HCL 5 MG/5ML PO SOLN
5.0000 mg | Freq: Once | ORAL | Status: DC | PRN
  Filled 2018-12-19: qty 5

## 2018-12-19 MED ORDER — KETOROLAC TROMETHAMINE 30 MG/ML IJ SOLN
INTRAMUSCULAR | Status: DC | PRN
  Administered 2018-12-19: 30 mg via INTRAVENOUS

## 2018-12-19 MED ORDER — LIDOCAINE 2% (20 MG/ML) 5 ML SYRINGE
INTRAMUSCULAR | Status: DC | PRN
  Administered 2018-12-19: 80 mg via INTRAVENOUS

## 2018-12-19 MED ORDER — MIDAZOLAM HCL 2 MG/2ML IJ SOLN
2.0000 mg | Freq: Once | INTRAMUSCULAR | Status: DC
  Filled 2018-12-19: qty 2

## 2018-12-19 MED ORDER — FENTANYL CITRATE (PF) 100 MCG/2ML IJ SOLN
25.0000 ug | INTRAMUSCULAR | Status: DC | PRN
  Administered 2018-12-19: 25 ug via INTRAVENOUS
  Filled 2018-12-19: qty 1

## 2018-12-19 MED ORDER — PROPOFOL 10 MG/ML IV BOLUS
INTRAVENOUS | Status: AC
  Filled 2018-12-19: qty 40

## 2018-12-19 MED ORDER — HYDROCODONE-ACETAMINOPHEN 5-325 MG PO TABS: 1.0000 | ORAL_TABLET | ORAL | 0 refills | Status: DC | PRN

## 2018-12-19 MED ORDER — MIDAZOLAM HCL 2 MG/2ML IJ SOLN
1.0000 mg | Freq: Once | INTRAMUSCULAR | Status: AC
  Administered 2018-12-19: 1 mg via INTRAVENOUS
  Filled 2018-12-19: qty 2

## 2018-12-19 MED ORDER — LACTATED RINGERS IV SOLN
INTRAVENOUS | Status: DC
  Administered 2018-12-19 (×2): via INTRAVENOUS
  Filled 2018-12-19: qty 1000

## 2018-12-19 SURGICAL SUPPLY — 25 items
BAG DRAIN URO-CYSTO SKYTR STRL (DRAIN) ×3 IMPLANT
BAG DRN ANRFLXCHMBR STRAP LEK (BAG)
BAG DRN UROCATH (DRAIN) ×1
BAG URINE DRAINAGE (UROLOGICAL SUPPLIES) ×2 IMPLANT
BAG URINE LEG 19OZ MD ST LTX (BAG) IMPLANT
BAG URINE LEG 500ML (DRAIN) IMPLANT
CATH FOLEY 2WAY SLVR  5CC 18FR (CATHETERS) ×2
CATH FOLEY 2WAY SLVR  5CC 22FR (CATHETERS)
CATH FOLEY 2WAY SLVR 30CC 20FR (CATHETERS) IMPLANT
CATH FOLEY 2WAY SLVR 5CC 18FR (CATHETERS) IMPLANT
CATH FOLEY 2WAY SLVR 5CC 22FR (CATHETERS) IMPLANT
CLOTH BEACON ORANGE TIMEOUT ST (SAFETY) ×3 IMPLANT
ELECT REM PT RETURN 9FT ADLT (ELECTROSURGICAL) ×3
ELECTRODE REM PT RTRN 9FT ADLT (ELECTROSURGICAL) ×1 IMPLANT
EVACUATOR MICROVAS BLADDER (UROLOGICAL SUPPLIES) IMPLANT
GLOVE BIO SURGEON STRL SZ7.5 (GLOVE) ×3 IMPLANT
GOWN STRL REUS W/ TWL LRG LVL3 (GOWN DISPOSABLE) ×1 IMPLANT
GOWN STRL REUS W/TWL LRG LVL3 (GOWN DISPOSABLE) ×3
IV NS IRRIG 3000ML ARTHROMATIC (IV SOLUTION) ×3 IMPLANT
KIT TURNOVER CYSTO (KITS) ×3 IMPLANT
MANIFOLD NEPTUNE II (INSTRUMENTS) IMPLANT
PACK CYSTO (CUSTOM PROCEDURE TRAY) ×3 IMPLANT
SYRINGE IRR TOOMEY STRL 70CC (SYRINGE) IMPLANT
TUBE CONNECTING 12'X1/4 (SUCTIONS) ×1
TUBE CONNECTING 12X1/4 (SUCTIONS) ×1 IMPLANT

## 2018-12-19 NOTE — Anesthesia Preprocedure Evaluation (Addendum)
Anesthesia Evaluation  Patient identified by MRN, date of birth, ID band Patient awake    Reviewed: Allergy & Precautions, NPO status , Patient's Chart, lab work & pertinent test results  Airway Mallampati: II  TM Distance: >3 FB Neck ROM: Full    Dental no notable dental hx.    Pulmonary neg pulmonary ROS, former smoker,    Pulmonary exam normal breath sounds clear to auscultation       Cardiovascular hypertension, + angina with exertion Normal cardiovascular exam+ dysrhythmias  Rhythm:Regular Rate:Normal     Neuro/Psych Anxiety negative neurological ROS     GI/Hepatic negative GI ROS, Neg liver ROS,   Endo/Other  negative endocrine ROSdiabetes  Renal/GU negative Renal ROS  negative genitourinary   Musculoskeletal negative musculoskeletal ROS (+)   Abdominal   Peds negative pediatric ROS (+)  Hematology negative hematology ROS (+)   Anesthesia Other Findings   Reproductive/Obstetrics negative OB ROS                            Anesthesia Physical Anesthesia Plan  ASA: II  Anesthesia Plan: General   Post-op Pain Management:    Induction: Intravenous  PONV Risk Score and Plan: 2 and Ondansetron, Dexamethasone and Treatment may vary due to age or medical condition  Airway Management Planned: LMA  Additional Equipment:   Intra-op Plan:   Post-operative Plan: Extubation in OR  Informed Consent: I have reviewed the patients History and Physical, chart, labs and discussed the procedure including the risks, benefits and alternatives for the proposed anesthesia with the patient or authorized representative who has indicated his/her understanding and acceptance.     Dental advisory given  Plan Discussed with: CRNA and Surgeon  Anesthesia Plan Comments:         Anesthesia Quick Evaluation

## 2018-12-19 NOTE — Op Note (Signed)
Operative Note  Preoperative diagnosis:  1.  Bladder tumor  Postoperative diagnosis: 1.  Bladder tumor--medium  Procedure(s): 1.  Transurethral resection of bladder tumor--medium  2.  Intravesical instillation of chemotherapy agent--gemcitabine  Surgeon: Link Snuffer, MD  Assistants: None  Anesthesia: General  Complications: None immediate  EBL: Minimal  Specimens: 1.  Bladder tumor  Drains/Catheters: 1.  18 French Foley catheter  Intraoperative findings: 1.  Normal urethra 2.  Approximately 2 cm bladder tumor on the left lateral wall close to the bladder neck that appeared to be completely resected.  It appeared superficial in nature.  There were no other bladder tumors.  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  He presents for the previously mentioned operation.  Description of procedure:  The patient was identified and consent was obtained.  The patient was taken to the operating room and placed in the supine position.  The patient was placed under general anesthesia.  Perioperative antibiotics were administered.  The patient was placed in dorsal lithotomy.  Patient was prepped and draped in a standard sterile fashion and a timeout was performed.  A 26 French resectoscope with the visual obturator in place was advanced into the urethra and into the bladder.  This was exchanged for the working element.  On bipolar settings, I proceeded to resect the bladder tumor of interest.  This was collected for specimen.  It appeared to be completely resected.  I fulgurated the resection bed.  Cystoscopy revealed no other bladder tumors.  I then withdrew the scope and placed a 18 French Foley catheter.  Patient tolerated procedure well and was stable  postoperatively.  In the PACU, the patient had intravesical instillation of chemotherapy--gemcitabine.  This maintained in his bladder for approximately 1 hour at which point it was properly discarded and Foley catheter removed.  Plan: Return in 1 week for pathology review

## 2018-12-19 NOTE — Anesthesia Postprocedure Evaluation (Signed)
Anesthesia Post Note  Patient: [name]  Procedure(s) Performed: TRANSURETHRAL RESECTION OF BLADDER TUMOR WITH GEMCITABINE (N/A )     Patient location during evaluation: PACU Anesthesia Type: General Level of consciousness: sedated and patient cooperative Pain management: pain level controlled Vital Signs Assessment: post-procedure vital signs reviewed and stable Respiratory status: spontaneous breathing Cardiovascular status: stable Anesthetic complications: no    Last Vitals:  Vitals:   12/19/18 1400 12/19/18 1415  BP: 123/75 125/79  Pulse: 66 (!) 58  Resp: (!) 5 11  Temp:    SpO2: 95% 95%    Last Pain:  Vitals:   12/19/18 1415  PainSc: [place]

## 2018-12-19 NOTE — Anesthesia Procedure Notes (Signed)
Procedure Name: LMA Insertion Date/Time: 12/19/2018 12:42 PM Performed by: Wanita Chamberlain, CRNA Pre-anesthesia Checklist: Patient identified, Emergency Drugs available, Suction available, Patient being monitored and Timeout performed Patient Re-evaluated:Patient Re-evaluated prior to induction Oxygen Delivery Method: Circle system utilized Preoxygenation: Pre-oxygenation with 100% oxygen Induction Type: IV induction Ventilation: Mask ventilation without difficulty LMA: LMA inserted LMA Size: 4.0 Number of attempts: 1 Intubation method: gauze bite block placed. Placement Confirmation: breath sounds checked- equal and bilateral,  CO2 detector and positive ETCO2 Tube secured with: Tape Dental Injury: Teeth and Oropharynx as per pre-operative assessment

## 2018-12-19 NOTE — Transfer of Care (Signed)
Immediate Anesthesia Transfer of Care Note  Patient: [name]  Procedure(s) Performed: TRANSURETHRAL RESECTION OF BLADDER TUMOR WITH GEMCITABINE (N/A )  Patient Location: PACU  Anesthesia Type:General  Level of Consciousness: sedated and patient cooperative  Airway & Oxygen Therapy: Patient Spontanous Breathing and Patient connected to nasal cannula oxygen  Post-op Assessment: Report given to RN and Post -op Vital signs reviewed and stable  Post vital signs: Reviewed and stable  Last Vitals:  Vitals Value Taken Time  BP    Temp    Pulse 69 12/19/2018  1:13 PM  Resp    SpO2 93 % 12/19/2018  1:13 PM  Vitals shown include unvalidated device data.  Last Pain:  Vitals:   12/19/18 1032  PainSc: 0-No pain      Patients Stated Pain Goal: 5 (73/22/56 7209)  Complications: No apparent anesthesia complications

## 2018-12-19 NOTE — Discharge Instructions (Addendum)
Transurethral Resection of Bladder Tumor (TURBT) or Bladder Biopsy ° ° °Definition: ° Transurethral Resection of the Bladder Tumor is a surgical procedure used to diagnose and remove tumors within the bladder. TURBT is the most common treatment for early stage bladder cancer. ° °General instructions: °   ° Your recent bladder surgery requires very little post hospital care but some definite precautions. ° °Despite the fact that no skin incisions were used, the area around the bladder incisions are raw and covered with scabs to promote healing and prevent bleeding. Certain precautions are needed to insure that the scabs are not disturbed over the next 2-4 weeks while the healing proceeds. ° °Because the raw surface inside your bladder and the irritating effects of urine you may expect frequency of urination and/or urgency (a stronger desire to urinate) and perhaps even getting up at night more often. This will usually resolve or improve slowly over the healing period. You may see some blood in your urine over the first 6 weeks. Do not be alarmed, even if the urine was clear for a while. Get off your feet and drink lots of fluids until clearing occurs. If you start to pass clots or don't improve call us. ° °Diet: ° °You may return to your normal diet immediately. Because of the raw surface of your bladder, alcohol, spicy foods, foods high in acid and drinks with caffeine may cause irritation or frequency and should be used in moderation. To keep your urine flowing freely and avoid constipation, drink plenty of fluids during the day (8-10 glasses). Tip: Avoid cranberry juice because it is very acidic. ° °Activity: ° °Your physical activity doesn't need to be restricted. However, if you are very active, you may see some blood in the urine. We suggest that you reduce your activity under the circumstances until the bleeding has stopped. ° °Bowels: ° °It is important to keep your bowels regular during the postoperative  period. Straining with bowel movements can cause bleeding. A bowel movement every other day is reasonable. Use a mild laxative if needed, such as milk of magnesia 2-3 tablespoons, or 2 Dulcolax tablets. Call if you continue to have problems. If you had been taking narcotics for pain, before, during or after your surgery, you may be constipated. Take a laxative if necessary. ° ° ° °Medication: ° °You should resume your pre-surgery medications unless told not to. In addition you may be given an antibiotic to prevent or treat infection. Antibiotics are not always necessary. All medication should be taken as prescribed until the bottles are finished unless you are having an unusual reaction to one of the drugs. ° ° ° ° ° °Post Anesthesia Home Care Instructions ° °Activity: °Get plenty of rest for the remainder of the day. A responsible adult should stay with you for 24 hours following the procedure.  °For the next 24 hours, DO NOT: °-Drive a car °-Operate machinery °-Drink alcoholic beverages °-Take any medication unless instructed by your physician °-Make any legal decisions or sign important papers. ° °Meals: °Start with liquid foods such as gelatin or soup. Progress to regular foods as tolerated. Avoid greasy, spicy, heavy foods. If nausea and/or vomiting occur, drink only clear liquids until the nausea and/or vomiting subsides. Call your physician if vomiting continues. ° °Special Instructions/Symptoms: °Your throat may feel dry or sore from the anesthesia or the breathing tube placed in your throat during surgery. If this causes discomfort, gargle with warm salt water. The discomfort should disappear within 24   you had a scopolamine patch placed behind your ear for the management of post- operative nausea and/or vomiting:  1. The medication in the patch is effective for 72 hours, after which it should be removed.  Wrap patch in a tissue and discard in the trash. Wash hands thoroughly with soap and  water. 2. You may remove the patch earlier than 72 hours if you experience unpleasant side effects which may include dry mouth, dizziness or visual disturbances. 3. Avoid touching the patch. Wash your hands with soap and water after contact with the patch.   No advil, aleve, motrin, ibuprofen until 7 pm tonight

## 2018-12-19 NOTE — H&P (Signed)
CC/HPI: Cc: Bladder cancer  HPI:   07/03/2018:  A 58 year old male was evaluated by Dr. Pilar Jarvis for gross hematuria. He underwent a CT IVP which revealed a 2 cm bladder mass. No other abnormalities. No hydronephrosis or kidney or ureteral tumors. The patient has had no further gross hematuria. He presents today for cystoscopy. No other complaints.   08/01/2018:  Patient presents as a postoperative follow-up after TURBT. This revealed high-grade urothelial cell carcinoma, superficial Ta. Muscularis was present and uninvolved. During TURBT, I noted that the entire tumor was resected.   11/14/2018:  Patient completed 6 treatments with BCG. He presents today for surveillance cystoscopy. Denies interval gross hematuria.     ALLERGIES: No Allergies    MEDICATIONS: Aspirin 325 mg tablet  Alprazolam 0.5 mg tablet  Atorvastatin Calcium 80 mg tablet  Duloxetine Hcl 60 mg capsule,delayed release  Hydrocodone-Acetaminophen 5 mg-325 mg tablet  Icosapent Ethyl  Nebivolol 1 PO Daily  Sildenafil Citrate 20 mg tablet Take 1 to 5 tabs PO daily prn  Vitamin D2 2,000 unit tablet     GU PSH: Bladder Instill AntiCA Agent - 09/23/2018, 09/16/2018, 09/09/2018, 09/02/2018, 08/26/2018, 08/19/2018, 07/23/2018 Cystoscopy - 07/03/2018 Cystoscopy TURBT 2-5 cm - 07/23/2018 Locm 300-399Mg /Ml Iodine,1Ml - 06/25/2018    NON-GU PSH: Fusion Spine Tonsillectomy        GU PMH: Bladder Cancer Lateral - 08/01/2018 Bladder tumor/neoplasm - 07/03/2018 BPH w/o LUTS - 06/18/2018 ED due to arterial insufficiency - 06/18/2018 Gross hematuria - 06/18/2018    NON-GU PMH: Anxiety Arthritis Depression Diabetes Type 2 Hypercholesterolemia    FAMILY HISTORY: Heart Attack - Father   SOCIAL HISTORY: Marital Status: Single Ethnicity: Not Hispanic Or Latino; Race: White Current Smoking Status: Patient does not smoke anymore. Has not smoked since 05/26/2013.  <DIV'  Tobacco Use Assessment Completed:  Used Tobacco in last 30 days?   Does  drink.  Drinks 1 caffeinated drink per day.    REVIEW OF SYSTEMS:     GU Review Male:  Patient denies frequent urination, hard to postpone urination, burning/ pain with urination, get up at night to urinate, leakage of urine, stream starts and stops, trouble starting your stream, have to strain to urinate , erection problems, and penile pain.    Gastrointestinal (Upper):  Patient denies nausea, vomiting, and indigestion/ heartburn.    Gastrointestinal (Lower):  Patient denies diarrhea and constipation.    Constitutional:  Patient denies fever, night sweats, weight loss, and fatigue.    Skin:  Patient denies skin rash/ lesion and itching.    Eyes:  Patient denies blurred vision and double vision.    Ears/ Nose/ Throat:  Patient denies sore throat and sinus problems.    Hematologic/Lymphatic:  Patient denies swollen glands and easy bruising.    Cardiovascular:  Patient denies leg swelling and chest pains.    Respiratory:  Patient denies cough and shortness of breath.    Endocrine:  Patient denies excessive thirst.    Musculoskeletal:  Patient denies back pain and joint pain.    Neurological:  Patient denies headaches and dizziness.    Psychologic:  Patient denies depression and anxiety.    VITAL SIGNS:       11/14/2018 11:30 AM     Weight 213 lb / 96.62 kg     Height 70 in / 177.8 cm     BP 157/95 mmHg     Pulse 70 /min     Temperature 97.4 F / 36.3 C     BMI 30.6  kg/m     MULTI-SYSTEM PHYSICAL EXAMINATION:      Constitutional: Well-nourished. No physical deformities. Normally developed. Good grooming.     Respiratory: No labored breathing, no use of accessory muscles.      Cardiovascular: Normal temperature, adequate perfusion of extremities     Skin: No paleness, no jaundice     Neurologic / Psychiatric: Oriented to time, oriented to place, oriented to person. No depression, no anxiety, no agitation.     Gastrointestinal: No mass, no tenderness, no rigidity, non obese abdomen.      Eyes: Normal conjunctivae. Normal eyelids.     Musculoskeletal: Normal gait and station of head and neck.            PAST DATA REVIEWED:   Source Of History:  Patient   PROCEDURES:    Flexible Cystoscopy - 52000  Risks, benefits, and some of the potential complications of the procedure were discussed at length with the patient including infection, bleeding, voiding discomfort, urinary retention, fever, chills, sepsis, and others. All questions were answered. Informed consent was obtained. Antibiotic prophylaxis was given. Sterile technique and intraurethral analgesia were used.  Meatus:  Normal size. Normal location. Normal condition.  Urethra:  No strictures.  External Sphincter:  Normal.  Verumontanum:  Normal.  Prostate:  Borderline obstructing. Mild hyperplasia.  Bladder Neck:  Non-obstructing.  Ureteral Orifices:  Normal location. Normal size. Normal shape.   Bladder:  Small 1-2 cm bladder tumor on the left lateral wall. Solitary tumor.      The lower urinary tract was carefully examined. The procedure was well-tolerated and without complications. Antibiotic instructions were given. Instructions were given to call the office immediately for bloody urine, difficulty urinating, urinary retention, painful or frequent urination, fever, chills, nausea, vomiting or other illness. The patient stated that he understood these instructions and would comply with them.    Urinalysis w/Scope  Dipstick Dipstick Cont'd Micro  Color: Yellow Bilirubin: Neg mg/dL WBC/hpf: NS (Not Seen)  Appearance: Clear Ketones: Neg mg/dL RBC/hpf: 3 - 10/hpf  Specific Gravity: 1.025 Blood: 3+ ery/uL Bacteria: NS (Not Seen)  pH: 6.5 Protein: 3+ mg/dL Cystals: NS (Not Seen)  Glucose: Neg mg/dL Urobilinogen: 0.2 mg/dL Casts: NS (Not Seen)   Nitrites: Neg Trichomonas: Not Present   Leukocyte Esterase: Neg leu/uL Mucous: Not Present    Epithelial Cells: NS (Not Seen)    Yeast: NS (Not Seen)    Sperm: Not  Present    ASSESSMENT:     ICD-10 Details  1 GU:  Bladder Cancer Lateral - C67.2    PLAN:   Document  Letter(s):  Created for Patient: Clinical Summary   Notes:  Recommend repeat transurethral resection of bladder tumor with instillation of gemcitabine. He understands potential risks including but not limited to bleeding, infection, injury to surrounding structures, need for additional procedures.   Cc: Particia Nearing, PA   Signed by Link Snuffer, III, M.D. on 11/16/18 at 4:14 PM (EST

## 2018-12-22 ENCOUNTER — Encounter (HOSPITAL_BASED_OUTPATIENT_CLINIC_OR_DEPARTMENT_OTHER): Payer: Self-pay | Admitting: Urology

## 2019-04-06 ENCOUNTER — Other Ambulatory Visit: Payer: Self-pay

## 2019-04-06 ENCOUNTER — Encounter: Payer: Self-pay | Admitting: Physician Assistant

## 2019-04-06 ENCOUNTER — Ambulatory Visit (INDEPENDENT_AMBULATORY_CARE_PROVIDER_SITE_OTHER): Payer: Medicare Other | Admitting: Physician Assistant

## 2019-04-06 ENCOUNTER — Telehealth: Payer: Self-pay | Admitting: Physician Assistant

## 2019-04-06 DIAGNOSIS — E119 Type 2 diabetes mellitus without complications: Secondary | ICD-10-CM | POA: Diagnosis not present

## 2019-04-06 DIAGNOSIS — I10 Essential (primary) hypertension: Secondary | ICD-10-CM

## 2019-04-06 DIAGNOSIS — E785 Hyperlipidemia, unspecified: Secondary | ICD-10-CM

## 2019-04-06 MED ORDER — ATORVASTATIN CALCIUM 80 MG PO TABS: 80.0000 mg | ORAL_TABLET | Freq: Every morning | ORAL | 3 refills | Status: DC

## 2019-04-06 MED ORDER — ICOSAPENT ETHYL 1 G PO CAPS: 2.0000 g | ORAL_CAPSULE | Freq: Two times a day (BID) | ORAL | 3 refills | Status: DC

## 2019-04-06 MED ORDER — NEBIVOLOL HCL 5 MG PO TABS: 5.0000 mg | ORAL_TABLET | Freq: Every day | ORAL | 1 refills | Status: DC

## 2019-04-06 NOTE — Telephone Encounter (Signed)
There really is nothing that replaces this medication.  He can get fish oil over the counter.

## 2019-04-06 NOTE — Progress Notes (Signed)
Telephone visit  Subjective: CC: Hypertension, hyperlipidemia, diet-controlled diabetes PCP: Terald Sleeper, PA-C XUX:YBFXO A Rekowski is a 58 y.o. male calls for telephone consult today. Patient provides verbal consent for consult held via phone.  Patient is identified with 2 separate identifiers.  At this time the entire area is on COVID-19 social distancing and stay home orders are in place.  Patient is of higher risk and therefore we are performing this by a virtual method.  Location of patient: home Location of provider: HOME Others present for call: no  This patient has a phone call visit for his chronic medical conditions.  He does have hypertension, hyperlipidemia and diabetes that is controlled.  He does need refills on atorvastatin and Bystolic at this time.  He is still under the care of urology for his bladder cancer.  He will come for labs this summer.  A order will be placed.  If he has any other concerns please give [place] a call back   ROS: Per HPI  No Known Allergies Past Medical History:  Diagnosis Date  . Anxiety   . Benign essential hypertension    per pt denies ,  but is documented in epic by his pcp  . Bladder cancer [facility]) urologist-- dr bell   dx 08/ 2019  s/p  TURBT  . Bladder tumor   . CAD (coronary artery disease)    last cardiac cath 08-12-2014  mild non-obstructive CAD ,  normal LVF  . DDD (degenerative disc disease), lumbar   . Depression   . Diabetes mellitus type 2, diet-controlled ([place])   . Fatty liver   . First degree heart block   . Headache(784.0)   . History of acute pancreatitis 02/2015  . History of kidney stones   . History of syncope   . Hyperlipidemia   . Intermediate coronary syndrome ([place])   . Intermittent palpitations    takes bystolic  . Obesity   . Pancreatic divisum 2016    Current Outpatient Medications:  .  ALPRAZolam (XANAX) 0.5 MG tablet, Take 1 tablet (0.5 mg total) by mouth 2 (two) times daily as needed for anxiety.,  Disp: 40 tablet, Rfl: 1 .  aspirin 325 MG tablet, Take 325 mg by mouth daily., Disp: , Rfl:  .  atorvastatin (LIPITOR) 80 MG tablet, Take 1 tablet (80 mg total) by mouth every morning., Disp: 90 tablet, Rfl: 3 .  DULoxetine (CYMBALTA) 60 MG capsule, Take 1 capsule (60 mg total) by mouth daily. (Patient taking differently: Take 60 mg by mouth every morning. ), Disp: 90 capsule, Rfl: 3 .  HYDROcodone-acetaminophen (NORCO) 5-325 MG tablet, Take 1 tablet by mouth every 4 (four) hours as needed for moderate pain., Disp: 6 tablet, Rfl: 0 .  HYDROcodone-acetaminophen (NORCO/VICODIN) 5-325 MG tablet, Take 1 tablet by mouth every 8 (eight) hours as needed for moderate pain., Disp: 90 tablet, Rfl: 0 .  Icosapent Ethyl (VASCEPA) 1 g CAPS, Take 2 capsules (2 g total) by mouth 2 (two) times daily., Disp: 360 capsule, Rfl: 3 .  nebivolol (BYSTOLIC) 5 MG tablet, Take 1 tablet (5 mg total) by mouth daily., Disp: 90 tablet, Rfl: 1  Assessment/ Plan: 58 y.o. male   1. Benign essential HTN - nebivolol (BYSTOLIC) 5 MG tablet; Take 1 tablet (5 mg total) by mouth daily.  Dispense: 90 tablet; Refill: 1 - CBC with Differential/Platelet; Future - CMP14+EGFR; Future - Lipid panel; Future - Bayer DCA Hb A1c Waived; Future - PSA; Future  2. Type 2 diabetes mellitus without complication, without long-term current use of insulin (HCC) - CBC with Differential/Platelet; Future - Bayer DCA Hb A1c Waived; Future  3. Hyperlipidemia with target low density lipoprotein (LDL) cholesterol less than 100 mg/dL - atorvastatin (LIPITOR) 80 MG tablet; Take 1 tablet (80 mg total) by mouth every morning.  Dispense: 90 tablet; Refill: 3 - Icosapent Ethyl (VASCEPA) 1 g CAPS; Take 2 capsules (2 g total) by mouth 2 (two) times daily.  Dispense: 360 capsule; Refill: 3 - Lipid panel; Future   Start time: 10:22 AM End time: 10:30 AM  Meds ordered this encounter  Medications  . atorvastatin (LIPITOR) 80 MG tablet    Sig: Take 1  tablet (80 mg total) by mouth every morning.    Dispense:  90 tablet    Refill:  3    Order Specific Question:   Supervising Provider    Answer:   Janora Norlander [[id number]]  . nebivolol (BYSTOLIC) 5 MG tablet    Sig: Take 1 tablet (5 mg total) by mouth daily.    Dispense:  90 tablet    Refill:  1    Order Specific Question:   Supervising Provider    Answer:   Janora Norlander [[id number]]  . Icosapent Ethyl (VASCEPA) 1 g CAPS    Sig: Take 2 capsules (2 g total) by mouth 2 (two) times daily.    Dispense:  360 capsule    Refill:  3    Keep on file until patient calls    Order Specific Question:   Supervising Provider    Answer:   Janora Norlander [[id number]]    Particia Nearing PA-C [place] [phone]

## 2019-04-06 NOTE — Telephone Encounter (Signed)
Pt notified of recommendation Verbalizes understanding 

## 2019-04-07 ENCOUNTER — Other Ambulatory Visit: Payer: Self-pay | Admitting: Physician Assistant

## 2019-04-07 DIAGNOSIS — E785 Hyperlipidemia, unspecified: Secondary | ICD-10-CM

## 2019-04-09 ENCOUNTER — Telehealth: Payer: Self-pay | Admitting: *Deleted

## 2019-04-09 MED ORDER — LOSARTAN POTASSIUM 50 MG PO TABS: 50.0000 mg | ORAL_TABLET | Freq: Every day | ORAL | 3 refills | Status: DC

## 2019-04-09 NOTE — Telephone Encounter (Signed)
bystolic changed to losartan

## 2019-04-09 NOTE — Addendum Note (Signed)
Addended by: Chevis Pretty on: 04/09/2019 01:30 PM   Modules accepted: Orders

## 2019-04-09 NOTE — Telephone Encounter (Signed)
VM received from patient Would like Bystolic changed too expensive Please advise

## 2019-05-31 ENCOUNTER — Other Ambulatory Visit: Payer: Self-pay | Admitting: Physician Assistant

## 2019-06-28 ENCOUNTER — Other Ambulatory Visit: Payer: Self-pay | Admitting: Physician Assistant

## 2019-06-29 NOTE — Telephone Encounter (Signed)
appt made.  Patient aware  

## 2019-06-29 NOTE — Telephone Encounter (Signed)
[name] NTBS 30 days given 06/01/19

## 2019-07-01 ENCOUNTER — Other Ambulatory Visit: Payer: Self-pay

## 2019-07-01 ENCOUNTER — Other Ambulatory Visit: Payer: Medicare Other

## 2019-07-01 DIAGNOSIS — E785 Hyperlipidemia, unspecified: Secondary | ICD-10-CM

## 2019-07-01 DIAGNOSIS — I10 Essential (primary) hypertension: Secondary | ICD-10-CM

## 2019-07-01 DIAGNOSIS — E119 Type 2 diabetes mellitus without complications: Secondary | ICD-10-CM

## 2019-07-01 LAB — BAYER DCA HB A1C WAIVED: HB A1C (BAYER DCA - WAIVED): 6.2 % (ref <7.0)

## 2019-07-02 ENCOUNTER — Other Ambulatory Visit: Payer: Self-pay

## 2019-07-02 LAB — LIPID PANEL
Chol/HDL Ratio: 6.6 ratio — ABNORMAL HIGH (ref 0.0–5.0)
Cholesterol, Total: 244 mg/dL — ABNORMAL HIGH (ref 100–199)
HDL: 37 mg/dL — ABNORMAL LOW (ref >39)
LDL Calculated: 149 mg/dL — ABNORMAL HIGH (ref 0–99)
Triglycerides: 292 mg/dL — ABNORMAL HIGH (ref 0–149)
VLDL Cholesterol Cal: 58 mg/dL — ABNORMAL HIGH (ref 5–40)

## 2019-07-02 LAB — CMP14+EGFR
ALT: 37 IU/L (ref 0–44)
AST: 31 IU/L (ref 0–40)
Albumin/Globulin Ratio: 1.4 (ref 1.2–2.2)
Albumin: 4.1 g/dL (ref 3.8–4.9)
Alkaline Phosphatase: 96 IU/L (ref 39–117)
BUN/Creatinine Ratio: 13 (ref 9–20)
BUN: 13 mg/dL (ref 6–24)
Bilirubin Total: 0.7 mg/dL (ref 0.0–1.2)
CO2: 23 mmol/L (ref 20–29)
Calcium: 9.8 mg/dL (ref 8.7–10.2)
Chloride: 100 mmol/L (ref 96–106)
Creatinine, Ser: 0.99 mg/dL (ref 0.76–1.27)
GFR calc Af Amer: 97 mL/min/{1.73_m2} (ref >59)
GFR calc non Af Amer: 84 mL/min/{1.73_m2} (ref >59)
Globulin, Total: 3 g/dL (ref 1.5–4.5)
Glucose: 120 mg/dL — ABNORMAL HIGH (ref 65–99)
Potassium: 4 mmol/L (ref 3.5–5.2)
Sodium: 140 mmol/L (ref 134–144)
Total Protein: 7.1 g/dL (ref 6.0–8.5)

## 2019-07-02 LAB — CBC WITH DIFFERENTIAL/PLATELET
Basophils Absolute: 0.1 10*3/uL (ref 0.0–0.2)
Basos: 1 %
EOS (ABSOLUTE): 0.2 10*3/uL (ref 0.0–0.4)
Eos: 2 %
Hematocrit: 50.6 % (ref 37.5–51.0)
Hemoglobin: 17.5 g/dL (ref 13.0–17.7)
Immature Grans (Abs): 0 10*3/uL (ref 0.0–0.1)
Immature Granulocytes: 0 %
Lymphocytes Absolute: 2.2 10*3/uL (ref 0.7–3.1)
Lymphs: 23 %
MCH: 30.5 pg (ref 26.6–33.0)
MCHC: 34.6 g/dL (ref 31.5–35.7)
MCV: 88 fL (ref 79–97)
Monocytes Absolute: 0.6 10*3/uL (ref 0.1–0.9)
Monocytes: 7 %
Neutrophils Absolute: 6.4 10*3/uL (ref 1.4–7.0)
Neutrophils: 67 %
Platelets: 207 10*3/uL (ref 150–450)
RBC: 5.73 x10E6/uL (ref 4.14–5.80)
RDW: 12.2 % (ref 11.6–15.4)
WBC: 9.5 10*3/uL (ref 3.4–10.8)

## 2019-07-02 LAB — PSA: Prostate Specific Ag, Serum: 1.6 ng/mL (ref 0.0–4.0)

## 2019-07-03 ENCOUNTER — Encounter: Payer: Self-pay | Admitting: Physician Assistant

## 2019-07-03 ENCOUNTER — Ambulatory Visit (INDEPENDENT_AMBULATORY_CARE_PROVIDER_SITE_OTHER): Payer: Medicare Other | Admitting: Physician Assistant

## 2019-07-03 ENCOUNTER — Ambulatory Visit: Payer: Medicare Other | Admitting: Physician Assistant

## 2019-07-03 VITALS — BP 140/86 | HR 94 | Temp 98.9°F | Ht 71.0 in | Wt 233.0 lb

## 2019-07-03 DIAGNOSIS — R739 Hyperglycemia, unspecified: Secondary | ICD-10-CM

## 2019-07-03 DIAGNOSIS — F418 Other specified anxiety disorders: Secondary | ICD-10-CM

## 2019-07-03 DIAGNOSIS — M5136 Other intervertebral disc degeneration, lumbar region: Secondary | ICD-10-CM | POA: Diagnosis not present

## 2019-07-03 DIAGNOSIS — I10 Essential (primary) hypertension: Secondary | ICD-10-CM

## 2019-07-03 DIAGNOSIS — E785 Hyperlipidemia, unspecified: Secondary | ICD-10-CM

## 2019-07-03 MED ORDER — DULOXETINE HCL 30 MG PO CPEP: 30.0000 mg | ORAL_CAPSULE | Freq: Every day | ORAL | 3 refills | Status: DC

## 2019-07-03 MED ORDER — HYDROCODONE-ACETAMINOPHEN 5-325 MG PO TABS: 1.0000 | ORAL_TABLET | Freq: Three times a day (TID) | ORAL | 0 refills | Status: DC | PRN

## 2019-07-03 MED ORDER — LOSARTAN POTASSIUM 50 MG PO TABS: 50.0000 mg | ORAL_TABLET | Freq: Every day | ORAL | 3 refills | Status: DC

## 2019-07-03 MED ORDER — ATORVASTATIN CALCIUM 80 MG PO TABS: 80.0000 mg | ORAL_TABLET | Freq: Every morning | ORAL | 3 refills | Status: DC

## 2019-07-03 MED ORDER — OMEGA-3-ACID ETHYL ESTERS 1 G PO CAPS: 2.0000 g | ORAL_CAPSULE | Freq: Two times a day (BID) | ORAL | 3 refills | Status: DC

## 2019-07-05 ENCOUNTER — Encounter: Payer: Self-pay | Admitting: Physician Assistant

## 2019-07-05 DIAGNOSIS — M5136 Other intervertebral disc degeneration, lumbar region: Secondary | ICD-10-CM | POA: Insufficient documentation

## 2019-07-05 DIAGNOSIS — R739 Hyperglycemia, unspecified: Secondary | ICD-10-CM | POA: Insufficient documentation

## 2019-07-05 NOTE — Progress Notes (Signed)
BP 140/86   Pulse 94   Temp 98.9 F (37.2 C) (Temporal)   Ht 5' 11" (1.803 m)   Wt 233 lb (105.7 kg)   BMI 32.50 kg/m    Subjective:    Patient ID: [name], male    DOB: 1961-10-19, 58 y.o.   MRN: [id number]  HPI: [name] is a 58 y.o. male presenting on 07/03/2019 for Diabetes and Medical Management of Chronic Issues (6 month )  This patient comes in for follow-up on his chronic medical conditions.  They do include hypertension, hyperlipidemia, depression with anxiety which is greatly improved.  His hemoglobin A1c is slightly improving and we will continue to work on this if not medication will be added at the next visit.  He did really well with Vascepa, his triglycerides were almost normal.  However insurance is not paying for it anymore.  So working to try Lovaza to see if it will be beneficial or not.  We will see if the insurance restart.  He does need refills on his other medications.  He is quite stable.  He only uses rare pain medication on occasional times.  He only gets 1 prescription every 6 months.  We are not good I do a contract and drug screen at this time.  He is still followed by urology for his bladder cancer.  Past Medical History:  Diagnosis Date  . Anxiety   . Benign essential hypertension    per pt denies ,  but is documented in epic by his pcp  . Bladder cancer [facility]) urologist-- dr bell   dx 08/ 2019  s/p  TURBT  . Bladder tumor   . CAD (coronary artery disease)    last cardiac cath 08-12-2014  mild non-obstructive CAD ,  normal LVF  . DDD (degenerative disc disease), lumbar   . Depression   . Diabetes mellitus type 2, diet-controlled ([place])   . Fatty liver   . First degree heart block   . Headache(784.0)   . History of acute pancreatitis 02/2015  . History of kidney stones   . History of syncope   . Hyperlipidemia   . Intermediate coronary syndrome ([place])   . Intermittent palpitations    takes bystolic  . Obesity   . Pancreatic divisum  2016   Relevant past medical, surgical, family and social history reviewed and updated as indicated. Interim medical history since our last visit reviewed. Allergies and medications reviewed and updated. DATA REVIEWED: CHART IN EPIC  Family History reviewed for pertinent findings.  Review of Systems  Constitutional: Negative.  Negative for appetite change and fatigue.  Eyes: Negative for pain and visual disturbance.  Respiratory: Negative.  Negative for cough, chest tightness, shortness of breath and wheezing.   Cardiovascular: Negative.  Negative for chest pain, palpitations and leg swelling.  Gastrointestinal: Negative.  Negative for abdominal pain, diarrhea, nausea and vomiting.  Genitourinary: Negative.   Musculoskeletal: Positive for back pain and myalgias.  Skin: Negative.  Negative for color change and rash.  Neurological: Negative.  Negative for weakness, numbness and headaches.  Psychiatric/Behavioral: Negative.     Allergies as of 07/03/2019   No Known Allergies     Medication List       Accurate as of July 03, 2019 11:59 PM. If you have any questions, ask your nurse or doctor.        STOP taking these medications   Icosapent Ethyl 1 g Caps Commonly known as: Vascepa Stopped  by: Terald Sleeper, PA-C     TAKE these medications   ALPRAZolam 0.5 MG tablet Commonly known as: XANAX Take 1 tablet (0.5 mg total) by mouth 2 (two) times daily as needed for anxiety.   aspirin 325 MG tablet Take 325 mg by mouth daily.   atorvastatin 80 MG tablet Commonly known as: LIPITOR Take 1 tablet (80 mg total) by mouth every morning.   DULoxetine 30 MG capsule Commonly known as: CYMBALTA Take 1 capsule (30 mg total) by mouth daily. (Needs to be seen before next refill) What changed:   medication strength  how much to take Changed by: Terald Sleeper, PA-C   HYDROcodone-acetaminophen 5-325 MG tablet Commonly known as: NORCO/VICODIN Take 1 tablet by mouth every 8 (eight)  hours as needed for moderate pain. What changed: Another medication with the same name was removed. Continue taking this medication, and follow the directions you see here. Changed by: Terald Sleeper, PA-C   losartan 50 MG tablet Commonly known as: COZAAR Take 1 tablet (50 mg total) by mouth daily.   Misc. Devices Misc Initiate cpap at 15 cm of water pressure new machine and supplies DME company [place]   omega-3 acid ethyl esters 1 g capsule Commonly known as: LOVAZA Take 2 capsules (2 g total) by mouth 2 (two) times daily. Started by: Terald Sleeper, PA-C          Objective:    BP 140/86   Pulse 94   Temp 98.9 F (37.2 C) (Temporal)   Ht 5' 11" (1.803 m)   Wt 233 lb (105.7 kg)   BMI 32.50 kg/m   No Known Allergies  Wt Readings from Last 3 Encounters:  07/03/19 233 lb (105.7 kg)  12/19/18 220 lb 12.8 oz (100.2 kg)  07/23/18 216 lb 2 oz (98 kg)    Physical Exam Vitals signs and nursing note reviewed.  Constitutional:      General: He is not in acute distress.    Appearance: He is well-developed.  HENT:     Head: Normocephalic and atraumatic.  Eyes:     Conjunctiva/sclera: Conjunctivae normal.     Pupils: Pupils are equal, round, and reactive to light.  Cardiovascular:     Rate and Rhythm: Normal rate and regular rhythm.     Heart sounds: Normal heart sounds.  Pulmonary:     Effort: Pulmonary effort is normal. No respiratory distress.     Breath sounds: Normal breath sounds.  Skin:    General: Skin is warm and dry.  Psychiatric:        Behavior: Behavior normal.     Results for orders placed or performed in visit on 07/01/19  PSA  Result Value Ref Range   Prostate Specific Ag, Serum 1.6 0.0 - 4.0 ng/mL  Bayer DCA Hb A1c Waived  Result Value Ref Range   HB A1C (BAYER DCA - WAIVED) 6.2 <7.0 %  Lipid panel  Result Value Ref Range   Cholesterol, Total 244 (H) 100 - 199 mg/dL   Triglycerides 292 (H) 0 - 149 mg/dL   HDL 37 (L) >39 mg/dL   VLDL  Cholesterol Cal 58 (H) 5 - 40 mg/dL   LDL Calculated 149 (H) 0 - 99 mg/dL   Chol/HDL Ratio 6.6 (H) 0.0 - 5.0 ratio  CMP14+EGFR  Result Value Ref Range   Glucose 120 (H) 65 - 99 mg/dL   BUN 13 6 - 24 mg/dL   Creatinine, Ser 0.99 0.76 - 1.27  mg/dL   GFR calc non Af Amer 84 >59 mL/min/1.73   GFR calc Af Amer 97 >59 mL/min/1.73   BUN/Creatinine Ratio 13 9 - 20   Sodium 140 134 - 144 mmol/L   Potassium 4.0 3.5 - 5.2 mmol/L   Chloride 100 96 - 106 mmol/L   CO2 23 20 - 29 mmol/L   Calcium 9.8 8.7 - 10.2 mg/dL   Total Protein 7.1 6.0 - 8.5 g/dL   Albumin 4.1 3.8 - 4.9 g/dL   Globulin, Total 3.0 1.5 - 4.5 g/dL   Albumin/Globulin Ratio 1.4 1.2 - 2.2   Bilirubin Total 0.7 0.0 - 1.2 mg/dL   Alkaline Phosphatase 96 39 - 117 IU/L   AST 31 0 - 40 IU/L   ALT 37 0 - 44 IU/L  CBC with Differential/Platelet  Result Value Ref Range   WBC 9.5 3.4 - 10.8 x10E3/uL   RBC 5.73 4.14 - 5.80 x10E6/uL   Hemoglobin 17.5 13.0 - 17.7 g/dL   Hematocrit 50.6 37.5 - 51.0 %   MCV 88 79 - 97 fL   MCH 30.5 26.6 - 33.0 pg   MCHC 34.6 31.5 - 35.7 g/dL   RDW 12.2 11.6 - 15.4 %   Platelets 207 150 - 450 x10E3/uL   Neutrophils 67 Not Estab. %   Lymphs 23 Not Estab. %   Monocytes 7 Not Estab. %   Eos 2 Not Estab. %   Basos 1 Not Estab. %   Neutrophils Absolute 6.4 1.4 - 7.0 x10E3/uL   Lymphocytes Absolute 2.2 0.7 - 3.1 x10E3/uL   Monocytes Absolute 0.6 0.1 - 0.9 x10E3/uL   EOS (ABSOLUTE) 0.2 0.0 - 0.4 x10E3/uL   Basophils Absolute 0.1 0.0 - 0.2 x10E3/uL   Immature Granulocytes 0 Not Estab. %   Immature Grans (Abs) 0.0 0.0 - 0.1 x10E3/uL      Assessment & Plan:   1. Hyperlipidemia with target low density lipoprotein (LDL) cholesterol less than 100 mg/dL - CMP14+EGFR; Future - Lipid panel; Future - Bayer DCA Hb A1c Waived; Future - omega-3 acid ethyl esters (LOVAZA) 1 g capsule; Take 2 capsules (2 g total) by mouth 2 (two) times daily.  Dispense: 360 capsule; Refill: 3 - atorvastatin (LIPITOR) 80 MG  tablet; Take 1 tablet (80 mg total) by mouth every morning.  Dispense: 90 tablet; Refill: 3  2. Hyperglycemia - CMP14+EGFR; Future - Lipid panel; Future - Bayer DCA Hb A1c Waived; Future  3. DDD (degenerative disc disease), lumbar - HYDROcodone-acetaminophen (NORCO/VICODIN) 5-325 MG tablet; Take 1 tablet by mouth every 8 (eight) hours as needed for moderate pain.  Dispense: 90 tablet; Refill: 0  4. Benign essential HTN - losartan (COZAAR) 50 MG tablet; Take 1 tablet (50 mg total) by mouth daily.  Dispense: 90 tablet; Refill: 3  5. Depression with anxiety - DULoxetine (CYMBALTA) 30 MG capsule; Take 1 capsule (30 mg total) by mouth daily. (Needs to be seen before next refill)  Dispense: 90 capsule; Refill: 3  Bring down on the medication and hopefully to go off.  I have let him know that if his pain researches he can always go back up on it.  Continue all other maintenance medications as listed above.  Follow up plan: Return in about 6 months (around 01/03/2020) for lab appt,  appt with me in a few days.  Educational handout given for [place] PA-C [place] [address]  [place], [place] [address] [phone]   07/05/2019, 9:37  PM

## 2019-10-14 ENCOUNTER — Other Ambulatory Visit: Payer: Self-pay

## 2019-10-14 ENCOUNTER — Ambulatory Visit (INDEPENDENT_AMBULATORY_CARE_PROVIDER_SITE_OTHER): Payer: Medicare Other | Admitting: *Deleted

## 2019-10-14 DIAGNOSIS — Z23 Encounter for immunization: Secondary | ICD-10-CM

## 2019-10-20 ENCOUNTER — Other Ambulatory Visit: Payer: Self-pay | Admitting: Physician Assistant

## 2019-10-20 ENCOUNTER — Telehealth: Payer: Self-pay

## 2019-10-20 MED ORDER — OSELTAMIVIR PHOSPHATE 75 MG PO CAPS: 75.0000 mg | ORAL_CAPSULE | Freq: Two times a day (BID) | ORAL | 0 refills | Status: DC

## 2019-10-20 NOTE — Telephone Encounter (Signed)
On Sunday patient began having severe headache and body aches.  Monday he was still having same symptoms but also had fever ranging from 102 - 103.  He went for Covid testing and results were negative.  He has been taking Tylenol, Dayquil and Nyquil.  Still has all of above symptoms.  Please advise if patient should be treated for flu or what you recommend he do.  Please message Jan via teams chat with recommendations.

## 2019-10-22 ENCOUNTER — Other Ambulatory Visit: Payer: Self-pay | Admitting: Physician Assistant

## 2019-10-22 MED ORDER — CIPROFLOXACIN HCL 500 MG PO TABS: 500.0000 mg | ORAL_TABLET | Freq: Two times a day (BID) | ORAL | 0 refills | Status: DC

## 2019-11-17 ENCOUNTER — Ambulatory Visit (INDEPENDENT_AMBULATORY_CARE_PROVIDER_SITE_OTHER): Payer: Medicare Other | Admitting: Physician Assistant

## 2019-11-17 DIAGNOSIS — F418 Other specified anxiety disorders: Secondary | ICD-10-CM

## 2019-11-17 MED ORDER — DULOXETINE HCL 60 MG PO CPEP: 60.0000 mg | ORAL_CAPSULE | Freq: Every day | ORAL | 1 refills | Status: DC

## 2019-11-17 NOTE — Progress Notes (Signed)
Depression screen [facility] 2/9 11/17/2019 06/04/2018 05/07/2018 04/02/2018 08/15/2017  Decreased Interest 1 0 2 0 0  Down, Depressed, Hopeless 1 1 3  0 0  PHQ - 2 Score 2 1 5  0 0  Altered sleeping 1 0 1 - -  Tired, decreased energy 2 1 2  - -  Change in appetite 0 1 2 - -  Feeling bad or failure about yourself  2 1 2  - -  Trouble concentrating 2 1 2  - -  Moving slowly or fidgety/restless 1 0 2 - -  Suicidal thoughts 0 0 0 - -  PHQ-9 Score 10 5 16  - -  Difficult doing work/chores Somewhat difficult - - - -       Telephone visit  Subjective: [id number] PCP: Terald Sleeper, PA-C [phone] A Canche is a 58 y.o. male calls for telephone consult today. Patient provides verbal consent for consult held via phone.  Patient is identified with 2 separate identifiers.  At this time the entire area is on COVID-19 social distancing and stay home orders are in place.  Patient is of higher risk and therefore we are performing this by a virtual method.  Location of patient: home Location of provider: [facility] Others present for call: no  He reports that his depression has gotten worse with everything that is going on in the world.  He also is having issues with relationship patient seen.  He has divorced his wife of many years.  And he is not really sure what is going on with everything around him.  His bladder cancer has been clear.  He does have relationship with his children.  Depression screen [facility] 2/9 11/17/2019 06/04/2018 05/07/2018 04/02/2018 08/15/2017  Decreased Interest 1 0 2 0 0  Down, Depressed, Hopeless 1 1 3  0 0  PHQ - 2 Score 2 1 5  0 0  Altered sleeping 1 0 1 - -  Tired, decreased energy 2 1 2  - -  Change in appetite 0 1 2 - -  Feeling bad or failure about yourself  2 1 2  - -  Trouble concentrating 2 1 2  - -  Moving slowly or fidgety/restless 1 0 2 - -  Suicidal thoughts 0 0 0 - -  PHQ-9 Score 10 5 16  - -  Difficult doing work/chores Somewhat difficult - - - -      ROS: Per HPI  No  Known Allergies Past Medical History:  Diagnosis Date  . Anxiety   . Benign essential hypertension    per pt denies ,  but is documented in epic by his pcp  . Bladder cancer [facility]) urologist-- dr bell   dx 08/ 2019  s/p  TURBT  . Bladder tumor   . CAD (coronary artery disease)    last cardiac cath 08-12-2014  mild non-obstructive CAD ,  normal LVF  . DDD (degenerative disc disease), lumbar   . Depression   . Diabetes mellitus type 2, diet-controlled ([place])   . Fatty liver   . First degree heart block   . Headache(784.0)   . History of acute pancreatitis 02/2015  . History of kidney stones   . History of syncope   . Hyperlipidemia   . Intermediate coronary syndrome ([place])   . Intermittent palpitations    takes bystolic  . Obesity   . Pancreatic divisum 2016    Current Outpatient Medications:  .  ALPRAZolam (XANAX) 0.5 MG tablet, Take 1 tablet (0.5 mg total) by mouth 2 (two) times daily  as needed for anxiety., Disp: 40 tablet, Rfl: 1 .  aspirin 325 MG tablet, Take 325 mg by mouth daily., Disp: , Rfl:  .  atorvastatin (LIPITOR) 80 MG tablet, Take 1 tablet (80 mg total) by mouth every morning., Disp: 90 tablet, Rfl: 3 .  ciprofloxacin (CIPRO) 500 MG tablet, Take 1 tablet (500 mg total) by mouth 2 (two) times daily., Disp: 20 tablet, Rfl: 0 .  DULoxetine (CYMBALTA) 60 MG capsule, Take 1 capsule (60 mg total) by mouth daily., Disp: 30 capsule, Rfl: 1 .  HYDROcodone-acetaminophen (NORCO/VICODIN) 5-325 MG tablet, Take 1 tablet by mouth every 8 (eight) hours as needed for moderate pain., Disp: 90 tablet, Rfl: 0 .  losartan (COZAAR) 50 MG tablet, Take 1 tablet (50 mg total) by mouth daily., Disp: 90 tablet, Rfl: 3 .  Misc. Devices MISC, Initiate cpap at 15 cm of water pressure new machine and supplies DME company [employer] [place], Disp: , Rfl:  .  omega-3 acid ethyl esters (LOVAZA) 1 g capsule, Take 2 capsules (2 g total) by mouth 2 (two) times daily., Disp: 360 capsule, Rfl: 3 .  oseltamivir  (TAMIFLU) 75 MG capsule, Take 1 capsule (75 mg total) by mouth 2 (two) times daily., Disp: 10 capsule, Rfl: 0  Assessment/ Plan: 58 y.o. male   1. Depression with anxiety Referral to psychiatry - DULoxetine (CYMBALTA) 60 MG capsule; Take 1 capsule (60 mg total) by mouth daily.  Dispense: 30 capsule; Refill: 1   No follow-ups on file.  Continue all other maintenance medications as listed above.  Start time: 3:20 PM End time: 3:30 PM  Meds ordered this encounter  Medications  . DULoxetine (CYMBALTA) 60 MG capsule    Sig: Take 1 capsule (60 mg total) by mouth daily.    Dispense:  30 capsule    Refill:  1    Order Specific Question:   Supervising Provider    Answer:   Janora Norlander [id number]    Particia Nearing PA-C [place] [phone]

## 2019-11-23 ENCOUNTER — Encounter: Payer: Self-pay | Admitting: Physician Assistant

## 2019-11-24 ENCOUNTER — Telehealth: Payer: Self-pay | Admitting: Physician Assistant

## 2020-01-01 ENCOUNTER — Other Ambulatory Visit: Payer: Medicare Other

## 2020-01-01 ENCOUNTER — Other Ambulatory Visit: Payer: Self-pay

## 2020-01-01 DIAGNOSIS — R739 Hyperglycemia, unspecified: Secondary | ICD-10-CM

## 2020-01-01 DIAGNOSIS — E785 Hyperlipidemia, unspecified: Secondary | ICD-10-CM

## 2020-01-01 LAB — BAYER DCA HB A1C WAIVED: HB A1C (BAYER DCA - WAIVED): 5.6 % (ref <7.0)

## 2020-01-02 LAB — CMP14+EGFR
ALT: 37 IU/L (ref 0–44)
AST: 38 IU/L (ref 0–40)
Albumin/Globulin Ratio: 1.6 (ref 1.2–2.2)
Albumin: 4.2 g/dL (ref 3.8–4.9)
Alkaline Phosphatase: 98 IU/L (ref 39–117)
BUN/Creatinine Ratio: 17 (ref 9–20)
BUN: 15 mg/dL (ref 6–24)
Bilirubin Total: 0.5 mg/dL (ref 0.0–1.2)
CO2: 23 mmol/L (ref 20–29)
Calcium: 9.3 mg/dL (ref 8.7–10.2)
Chloride: 102 mmol/L (ref 96–106)
Creatinine, Ser: 0.89 mg/dL (ref 0.76–1.27)
GFR calc Af Amer: 109 mL/min/{1.73_m2} (ref >59)
GFR calc non Af Amer: 94 mL/min/{1.73_m2} (ref >59)
Globulin, Total: 2.7 g/dL (ref 1.5–4.5)
Glucose: 110 mg/dL — ABNORMAL HIGH (ref 65–99)
Potassium: 3.9 mmol/L (ref 3.5–5.2)
Sodium: 140 mmol/L (ref 134–144)
Total Protein: 6.9 g/dL (ref 6.0–8.5)

## 2020-01-02 LAB — LIPID PANEL
Chol/HDL Ratio: 5.8 ratio — ABNORMAL HIGH (ref 0.0–5.0)
Cholesterol, Total: 227 mg/dL — ABNORMAL HIGH (ref 100–199)
HDL: 39 mg/dL — ABNORMAL LOW (ref >39)
LDL Chol Calc (NIH): 156 mg/dL — ABNORMAL HIGH (ref 0–99)
Triglycerides: 177 mg/dL — ABNORMAL HIGH (ref 0–149)
VLDL Cholesterol Cal: 32 mg/dL (ref 5–40)

## 2020-01-05 ENCOUNTER — Ambulatory Visit (INDEPENDENT_AMBULATORY_CARE_PROVIDER_SITE_OTHER): Payer: Medicare Other | Admitting: Physician Assistant

## 2020-01-05 ENCOUNTER — Encounter: Payer: Self-pay | Admitting: Physician Assistant

## 2020-01-05 ENCOUNTER — Other Ambulatory Visit: Payer: Self-pay

## 2020-01-05 DIAGNOSIS — M5136 Other intervertebral disc degeneration, lumbar region: Secondary | ICD-10-CM

## 2020-01-05 DIAGNOSIS — E785 Hyperlipidemia, unspecified: Secondary | ICD-10-CM | POA: Diagnosis not present

## 2020-01-05 DIAGNOSIS — R739 Hyperglycemia, unspecified: Secondary | ICD-10-CM | POA: Diagnosis not present

## 2020-01-05 DIAGNOSIS — F418 Other specified anxiety disorders: Secondary | ICD-10-CM | POA: Diagnosis not present

## 2020-01-05 DIAGNOSIS — M51369 Other intervertebral disc degeneration, lumbar region without mention of lumbar back pain or lower extremity pain: Secondary | ICD-10-CM

## 2020-01-05 DIAGNOSIS — L723 Sebaceous cyst: Secondary | ICD-10-CM

## 2020-01-05 MED ORDER — DULOXETINE HCL 60 MG PO CPEP: 60.0000 mg | ORAL_CAPSULE | Freq: Every day | ORAL | 1 refills | Status: DC

## 2020-01-05 MED ORDER — EZETIMIBE 10 MG PO TABS: 10.0000 mg | ORAL_TABLET | Freq: Every day | ORAL | 1 refills | Status: DC

## 2020-01-05 NOTE — Progress Notes (Signed)
Telephone visit  Subjective: CC: Recheck on chronic conditions PCP: Terald Sleeper, PA-C [id number] [name] is Stephen 59 y.o. male calls for telephone consult today. Patient provides verbal consent for consult held via phone.  Patient is identified with 2 separate identifiers.  At this time the entire area is on COVID-19 social distancing and stay home orders are in place.  Patient is of higher risk and therefore we are performing this by Stephen virtual method.  Location of patient: Home Location of provider: HOME Others present for call: NO   This patient is having Stephen follow-up for chronic medical conditions.  They do include hypertension, hyperlipidemia, depression and anxiety, elevation of glucose.  He also has Stephen sebaceous cyst on his forehead that is growing and becoming problematic.  It is not appearing red or pustules.  It has not drained.  But it has grown and he would like to have dermatology evaluation.  He states he has never had problems with the sebaceous cyst in the past  At this time he does feel that his depression anxiety is doing better with the duloxetine.  And it is possible that even his back pain is doing some better.  He is very judicious in the pain medicine he uses.  The last refill was about 6 months ago.  And he still has Stephen few tablets left.  While we are on the phone today I am getting go ahead and send in the prescription for that.  We have discussed his lab that he had performed recently.  It did show significant elevation of the LDL and therefore we are going to add Setia at this time and recheck it in Stephen couple of months.  If it does not go down at that time we will consider one of the injectable medications.  He states he does have Stephen strong family history hyperlipidemia.  We have talked about having counseling, the referral was made to psychiatrist but there was Stephen large upfront fee to be able to go there.  His wife is still in the employee with [employer] health medical  group.  I am going to call him back and let them know the phone number for [facility] so that he can call and get counseling through them.  In the past he had done this.  ROS: Per HPI  No Known Allergies Past Medical History:  Diagnosis Date  . Anxiety   . Benign essential hypertension    per pt denies ,  but is documented in epic by his pcp  . Bladder cancer [facility]) urologist-- dr bell   dx 08/ 2019  s/p  TURBT  . Bladder tumor   . CAD (coronary artery disease)    last cardiac cath 08-12-2014  mild non-obstructive CAD ,  normal LVF  . DDD (degenerative disc disease), lumbar   . Depression   . Diabetes mellitus type 2, diet-controlled ([place])   . Fatty liver   . First degree heart block   . Headache(784.0)   . History of acute pancreatitis 02/2015  . History of kidney stones   . History of syncope   . Hyperlipidemia   . Intermediate coronary syndrome ([place])   . Intermittent palpitations    takes bystolic  . Obesity   . Pancreatic divisum 2016    Current Outpatient Medications:  .  ALPRAZolam (XANAX) 0.5 MG tablet, Take 1 tablet (0.5 mg total) by mouth 2 (two) times daily as needed for  anxiety., Disp: 40 tablet, Rfl: 1 .  aspirin 325 MG tablet, Take 325 mg by mouth daily., Disp: , Rfl:  .  atorvastatin (LIPITOR) 80 MG tablet, Take 1 tablet (80 mg total) by mouth every morning., Disp: 90 tablet, Rfl: 3 .  DULoxetine (CYMBALTA) 60 MG capsule, Take 1 capsule (60 mg total) by mouth daily., Disp: 90 capsule, Rfl: 1 .  ezetimibe (ZETIA) 10 MG tablet, Take 1 tablet (10 mg total) by mouth daily., Disp: 90 tablet, Rfl: 1 .  HYDROcodone-acetaminophen (NORCO/VICODIN) 5-325 MG tablet, Take 1 tablet by mouth every 8 (eight) hours as needed for moderate pain., Disp: 90 tablet, Rfl: 0 .  losartan (COZAAR) 50 MG tablet, Take 1 tablet (50 mg total) by mouth daily., Disp: 90 tablet, Rfl: 3 .  Misc. Devices MISC, Initiate cpap at 15 cm of water pressure new machine and supplies DME company [employer],  Disp: , Rfl:  .  omega-3 acid ethyl esters (LOVAZA) 1 g capsule, Take 2 capsules (2 g total) by mouth 2 (two) times daily., Disp: 360 capsule, Rfl: 3  Assessment/ Plan: 59 y.o. male   1. Depression with anxiety Consider [facility] for counseling - DULoxetine (CYMBALTA) 60 MG capsule; Take 1 capsule (60 mg total) by mouth daily.  Dispense: 90 capsule; Refill: 1  2. Hyperglycemia Reduce sugar and bread in diet  3. Hyperlipidemia with target low density lipoprotein (LDL) cholesterol less than 100 mg/dL Add Zetia to regimen Take Lovaza at 2 capsules twice daily  4. DDD (degenerative disc disease), lumbar Refill hydrocodone 5 mg 1 tablet up to 3 times Stephen day (last refill 07/07/2019 of 90 pills)  5. Sebaceous cyst - Ambulatory referral to Dermatology   No follow-ups on file.  Continue all other maintenance medications as listed above.  Start time: 10:14 AM End time: 10:31 AM  Meds ordered this encounter  Medications  . ezetimibe (ZETIA) 10 MG tablet    Sig: Take 1 tablet (10 mg total) by mouth daily.    Dispense:  90 tablet    Refill:  1    Order Specific Question:   Supervising Provider    Answer:   Janora Norlander [id number]  . DULoxetine (CYMBALTA) 60 MG capsule    Sig: Take 1 capsule (60 mg total) by mouth daily.    Dispense:  90 capsule    Refill:  1    Order Specific Question:   Supervising Provider    Answer:   Janora Norlander [id number]    Particia Nearing PA-C [place] [phone]

## 2020-01-12 IMAGING — DX DG CHEST 2V
2 series · 2 of 2 positions shown · non-contrast
Comparison: PA and lateral chest x-ray February 28, 2013

CLINICAL DATA: Clinical bronchitis.

EXAM:
CHEST - 2 VIEW

[chest pa]
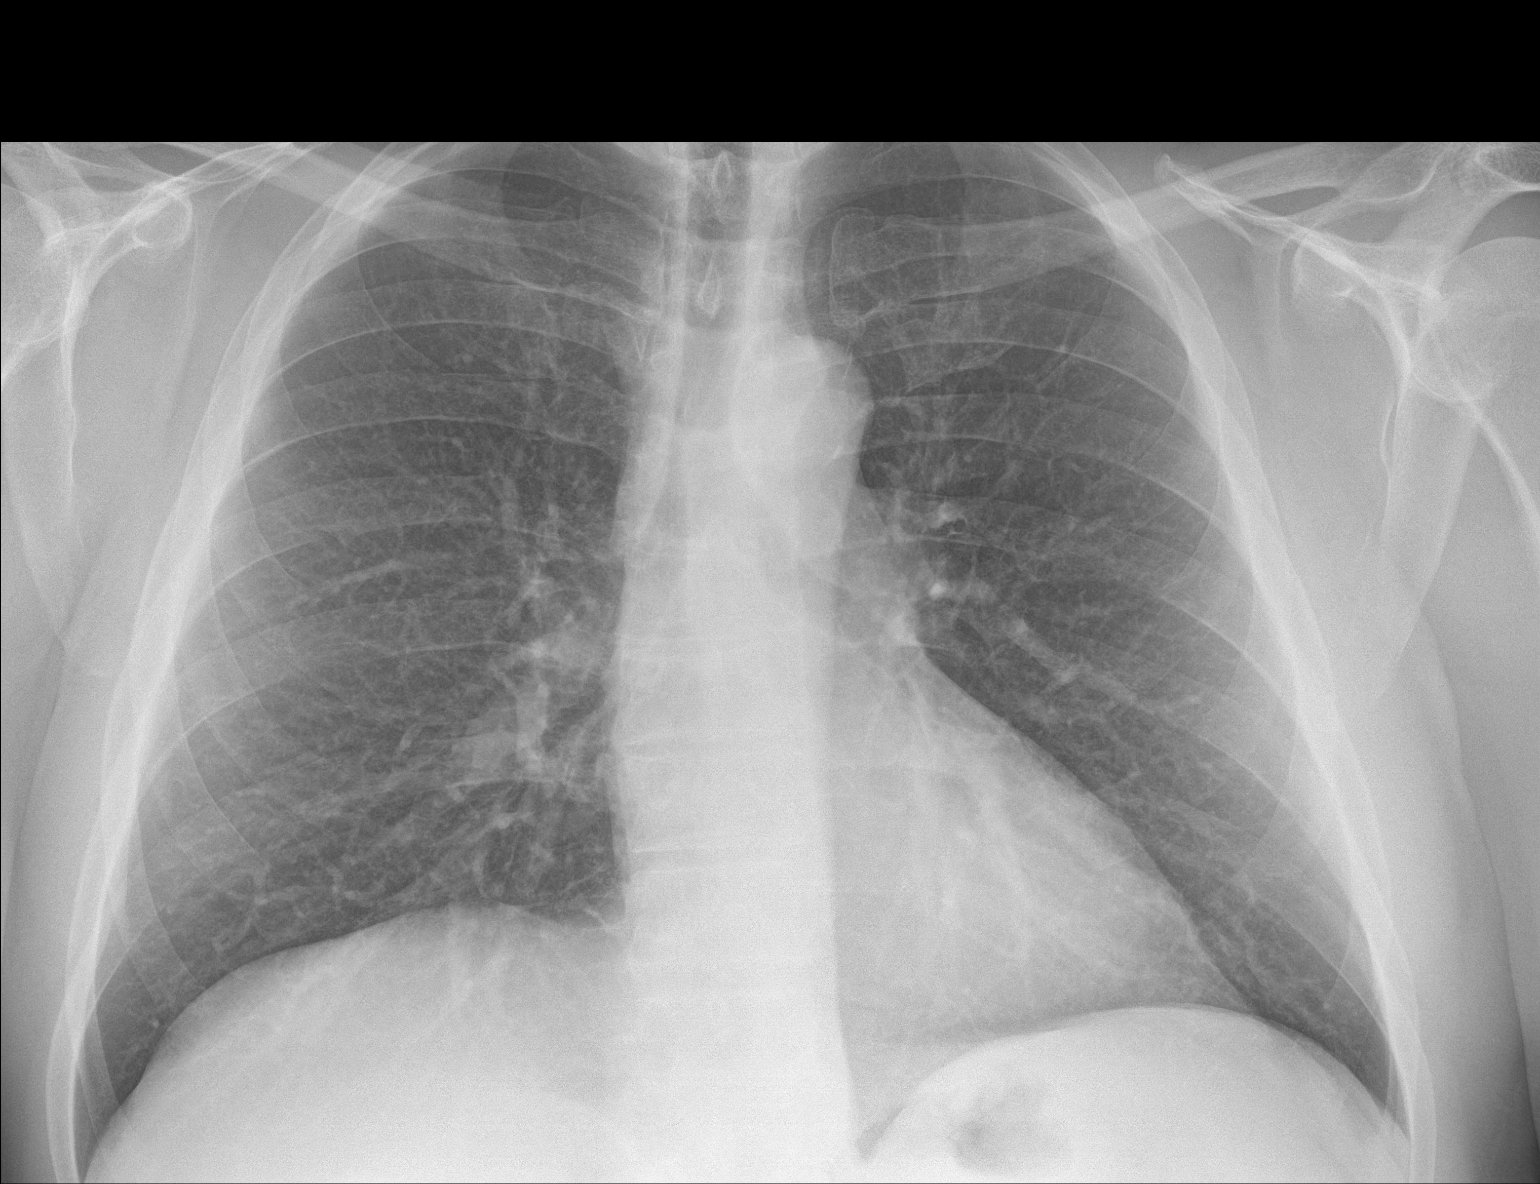

[chest lat]
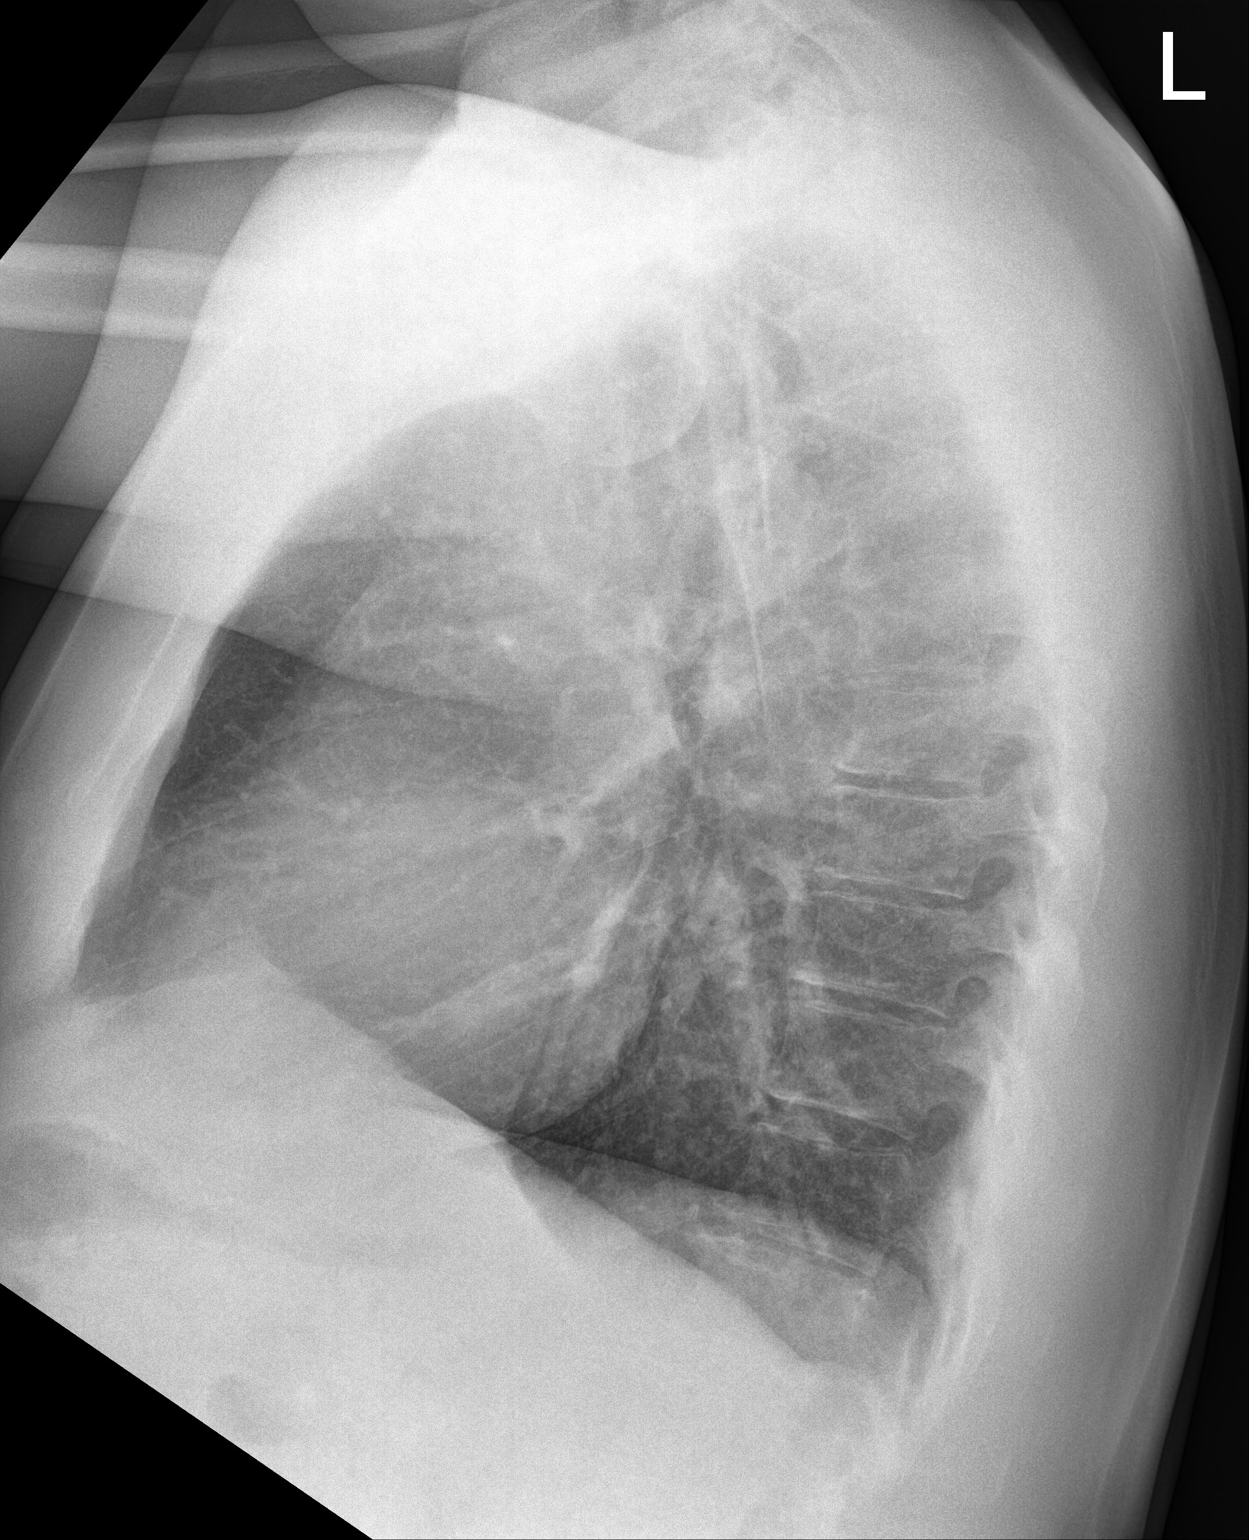

[2 of 2 positions shown; findings below may reference images not displayed]

FINDINGS: The lungs are well-expanded. The interstitial markings are coarse.
There is no alveolar infiltrate or pleural effusion. The heart and
pulmonary vascularity are normal. There is calcification in the wall
of the aortic arch. There is no pleural effusion.
IMPRESSION: There is no active cardiopulmonary disease.

## 2020-02-09 ENCOUNTER — Other Ambulatory Visit: Payer: Self-pay

## 2020-02-09 ENCOUNTER — Encounter: Payer: Self-pay | Admitting: Dermatology

## 2020-02-09 ENCOUNTER — Ambulatory Visit: Payer: Medicare Other | Admitting: Dermatology

## 2020-02-09 DIAGNOSIS — L729 Follicular cyst of the skin and subcutaneous tissue, unspecified: Secondary | ICD-10-CM

## 2020-02-09 DIAGNOSIS — L720 Epidermal cyst: Secondary | ICD-10-CM

## 2020-02-09 DIAGNOSIS — L821 Other seborrheic keratosis: Secondary | ICD-10-CM

## 2020-02-09 NOTE — Progress Notes (Signed)
   New Patient   Subjective  [name] is a 59 y.o. male who presents for the following: Cyst (mid forehead x yrs- growing,no pain).  cyst Location: forehead Duration: few years Quality: larger Associated Signs/Symptoms: Modifying Factors:  Severity:  Timing: Context: patient wants removal  The following portions of the chart were reviewed this encounter and updated as appropriate:     Objective  Well appearing patient in no apparent distress; mood and affect are within normal limits.  All skin waist up examined.  All skin examined today from the waist up.  No atypical moles or nonmelanoma skin cancer found.  Several dozen tan-gray 72mm rough spots on the hands represent acro keratoses and are safe to leave.  A 2cm deep dermal to subcutaneous epidermoid cyst is present in the center of the forehead.  Details relating to elective excision were provided and [name] chooses to schedule to have this removed. Assessment & Plan

## 2020-02-20 ENCOUNTER — Encounter: Payer: Self-pay | Admitting: Dermatology

## 2020-02-21 NOTE — Addendum Note (Signed)
Addended by: Lavonna Monarch on: 02/21/2020 08:49 AM   Modules accepted: Level of Service

## 2020-02-25 ENCOUNTER — Other Ambulatory Visit: Payer: Self-pay

## 2020-02-25 ENCOUNTER — Encounter: Payer: Self-pay | Admitting: Dermatology

## 2020-02-25 ENCOUNTER — Ambulatory Visit (INDEPENDENT_AMBULATORY_CARE_PROVIDER_SITE_OTHER): Payer: Medicare Other | Admitting: Dermatology

## 2020-02-25 DIAGNOSIS — L729 Follicular cyst of the skin and subcutaneous tissue, unspecified: Secondary | ICD-10-CM

## 2020-02-25 DIAGNOSIS — L720 Epidermal cyst: Secondary | ICD-10-CM | POA: Diagnosis not present

## 2020-02-25 NOTE — Progress Notes (Addendum)
   Follow-Up Visit   Subjective  [name] is a 59 y.o. male who presents for the following: Cyst (cyst on forehead larger in the past year).  cyst Location: forehead Duration: 1 year Quality: larger Associated Signs/Symptoms: Modifying Factors:  Severity:  Timing: Context: patient requests removal  The following portions of the chart were reviewed this encounter and updated as appropriate:     Objective  Well appearing patient in no apparent distress; mood and affect are within normal limits.  Examination limited to head and neck. 1.5cm SQ nodule forehead.   Assessment & Plan  Cyst of skin Mid Forehead  Excision at patient's request. Lidocaine-epi local anesthetic, 1cm ellipse, dissect out cyst with fibrotic base especially inferior/left, visible clear, layered closure, pressure dressing.  Skin excision - Mid Forehead  Lesion length (cm):  1 Lesion width (cm):  1 Total excision diameter (cm):  1.5 Informed consent: discussed and consent obtained   Timeout: patient name, date of birth, surgical site, and procedure verified   Procedure prep:  Patient was prepped and draped in usual sterile fashion Prep type:  Chlorhexidine Anesthesia: the lesion was anesthetized in a standard fashion   Anesthetic:  1% lidocaine w/ epinephrine 1-100,000 local infiltration Instrument used: #15 blade   Hemostasis achieved with: pressure   Hemostasis achieved with comment:  Electrocautery Outcome: patient tolerated procedure well with no complications   Post-procedure details: sterile dressing applied and wound care instructions given   Dressing type: bandage and pressure dressing (mupirocin)    Skin repair - Mid Forehead Complexity:  Intermediate Informed consent: discussed and consent obtained   Subcutaneous layers (deep stitches):  Suture size:  5-0 Suture type: Vicryl (polyglactin 910)   Stitches:  Buried vertical mattress Fine/surface layer approximation (top stitches):   Suture size:  5-0 Suture type comment:  Ethilon Stitches: simple interrupted   Outcome: patient tolerated procedure well with no complications   Post-procedure details: sterile dressing applied and wound care instructions given   Dressing type: petrolatum and bandage    Specimen 1 - Surgical pathology Differential Diagnosis: r/o cyst Check Margins: No 1.cm deep dermal to SQ soft nodule, growth in past year. Excision, 1cm ellipse, dissect nodule out, some fibrosis, base clean, layered closure. Warnings re: recurrence, scar, infection (patient can call me anytime). Return for outside sutures 6-8 days.

## 2020-02-25 NOTE — Patient Instructions (Signed)

## 2020-03-04 ENCOUNTER — Ambulatory Visit: Payer: Medicare Other

## 2020-03-18 DIAGNOSIS — C678 Malignant neoplasm of overlapping sites of bladder: Secondary | ICD-10-CM | POA: Diagnosis not present

## 2020-03-18 DIAGNOSIS — R3121 Asymptomatic microscopic hematuria: Secondary | ICD-10-CM | POA: Diagnosis not present

## 2020-05-25 ENCOUNTER — Ambulatory Visit: Payer: Medicare Other | Admitting: Family Medicine

## 2020-06-17 ENCOUNTER — Encounter: Payer: Self-pay | Admitting: Family Medicine

## 2020-06-20 ENCOUNTER — Ambulatory Visit (INDEPENDENT_AMBULATORY_CARE_PROVIDER_SITE_OTHER): Payer: Medicare Other | Admitting: Family Medicine

## 2020-06-20 ENCOUNTER — Encounter: Payer: Self-pay | Admitting: Family Medicine

## 2020-06-20 ENCOUNTER — Other Ambulatory Visit: Payer: Self-pay

## 2020-06-20 VITALS — BP 131/80 | HR 77 | Temp 99.0°F | Ht 71.0 in | Wt 222.4 lb

## 2020-06-20 DIAGNOSIS — I10 Essential (primary) hypertension: Secondary | ICD-10-CM

## 2020-06-20 DIAGNOSIS — F411 Generalized anxiety disorder: Secondary | ICD-10-CM | POA: Diagnosis not present

## 2020-06-20 DIAGNOSIS — M549 Dorsalgia, unspecified: Secondary | ICD-10-CM

## 2020-06-20 DIAGNOSIS — Z1159 Encounter for screening for other viral diseases: Secondary | ICD-10-CM

## 2020-06-20 DIAGNOSIS — Z79899 Other long term (current) drug therapy: Secondary | ICD-10-CM | POA: Diagnosis not present

## 2020-06-20 DIAGNOSIS — Z720 Tobacco use: Secondary | ICD-10-CM

## 2020-06-20 DIAGNOSIS — Z8249 Family history of ischemic heart disease and other diseases of the circulatory system: Secondary | ICD-10-CM

## 2020-06-20 DIAGNOSIS — E785 Hyperlipidemia, unspecified: Secondary | ICD-10-CM | POA: Diagnosis not present

## 2020-06-20 DIAGNOSIS — F418 Other specified anxiety disorders: Secondary | ICD-10-CM

## 2020-06-20 DIAGNOSIS — Z7689 Persons encountering health services in other specified circumstances: Secondary | ICD-10-CM

## 2020-06-20 DIAGNOSIS — Z9889 Other specified postprocedural states: Secondary | ICD-10-CM | POA: Insufficient documentation

## 2020-06-20 DIAGNOSIS — Z114 Encounter for screening for human immunodeficiency virus [HIV]: Secondary | ICD-10-CM

## 2020-06-20 MED ORDER — DULOXETINE HCL 60 MG PO CPEP: 60.0000 mg | ORAL_CAPSULE | Freq: Every day | ORAL | 1 refills | Status: DC

## 2020-06-20 MED ORDER — LOSARTAN POTASSIUM 50 MG PO TABS: 50.0000 mg | ORAL_TABLET | Freq: Every day | ORAL | 3 refills | Status: DC

## 2020-06-20 MED ORDER — HYDROCODONE-ACETAMINOPHEN 5-325 MG PO TABS: 1.0000 | ORAL_TABLET | Freq: Three times a day (TID) | ORAL | 0 refills | Status: DC | PRN

## 2020-06-20 MED ORDER — ATORVASTATIN CALCIUM 80 MG PO TABS: 80.0000 mg | ORAL_TABLET | Freq: Every morning | ORAL | 3 refills | Status: DC

## 2020-06-20 MED ORDER — HYDROXYZINE HCL 50 MG PO TABS: 25.0000 mg | ORAL_TABLET | Freq: Three times a day (TID) | ORAL | 5 refills | Status: DC | PRN

## 2020-06-20 NOTE — Progress Notes (Signed)
Subjective: CC: est care, HLD, HTN, chronic back pain, GAD PCP: Janora Norlander, DO OQH:UTMLY [name] is Stephen 59 y.o. male presenting to clinic today for:  1. GAD Patient reports longstanding history of generalized anxiety disorder with intermittent panic attacks.  He is on Cymbalta 60 mg daily and is compliant with his regimen.  He was prescribed Xanax in 2019 and continues to use this intermittently for breakthrough panic attacks.  Denies excessive daytime sedation, falls, dizziness, hallucinations or mental status changes.  No history of hospitalization for mental health disorder.  2.  Chronic back pain Patient reports chronic low back pain.  He has history of lumbar fusion.  He continues to have some breakthrough back pain that is only relieved by Norco.  He has been on several other regimens including OTC analgesics but this has not been helpful.  He takes the Norco sparingly and honestly only uses this about 2 to 3 days/month.  No falls.  No constipation.  3.  Hypertension with hyperlipidemia/ tobacco use Patient reports compliance with losartan 50 mg daily, Zetia 10 mg daily, Lipitor 80 mg daily and low vase Stephen 2 capsules/day (prescribed 4 capsules/day but unfortunately not affordable so he has been stretching this out).  No chest pain, shortness of breath, dizziness, headache, lower extremity edema or visual disturbance.  He does report Stephen strong family history of cardiovascular disease and in fact his father died from Stephen massive heart attack at age 14.  He has had Stephen stress test previously but has been several years.  He is an active every day smoker but is in the action phase of smoking cessation.  Currently smoking about Stephen pack per day.   ROS: Per HPI  No Known Allergies Past Medical History:  Diagnosis Date  . Acute pancreatitis   . Anxiety   . Atrial flutter ([place]) 09/30/2013  . Benign essential hypertension    per pt denies ,  but is documented in epic by his pcp  . Bladder  cancer [facility]) urologist-- dr bell   dx 08/ 2019  s/p  TURBT  . Bladder tumor   . CAD (coronary artery disease)    last cardiac cath 08-12-2014  mild non-obstructive CAD ,  normal LVF  . DDD (degenerative disc disease), lumbar   . Depression   . Diabetes mellitus type 2, diet-controlled ([place])   . Fatty liver   . First degree heart block   . Headache(784.0)   . History of acute pancreatitis 02/2015  . History of kidney stones   . History of syncope   . Hyperlipidemia   . Intermediate coronary syndrome ([place])   . Intermittent palpitations    takes bystolic  . Obesity   . Pancreatic divisum 2016    Current Outpatient Medications:  .  ALPRAZolam (XANAX) 0.5 MG tablet, Take 0.5 mg by mouth at bedtime as needed for anxiety (as needed)., Disp: , Rfl:  .  aspirin 325 MG tablet, Take 325 mg by mouth daily., Disp: , Rfl:  .  atorvastatin (LIPITOR) 80 MG tablet, Take 1 tablet (80 mg total) by mouth every morning., Disp: 90 tablet, Rfl: 3 .  DULoxetine (CYMBALTA) 60 MG capsule, Take 1 capsule (60 mg total) by mouth daily., Disp: 90 capsule, Rfl: 1 .  ezetimibe (ZETIA) 10 MG tablet, Take 1 tablet (10 mg total) by mouth daily., Disp: 90 tablet, Rfl: 1 .  HYDROcodone-acetaminophen (NORCO/VICODIN) 5-325 MG tablet, Take 1 tablet by mouth every 8 (eight) hours as  needed for moderate pain., Disp: 90 tablet, Rfl: 0 .  losartan (COZAAR) 50 MG tablet, Take 1 tablet (50 mg total) by mouth daily., Disp: 90 tablet, Rfl: 3 .  Misc. Devices MISC, Initiate cpap at 15 cm of water pressure new machine and supplies DME company [place], Disp: , Rfl:  .  omega-3 acid ethyl esters (LOVAZA) 1 g capsule, Take 2 capsules (2 g total) by mouth 2 (two) times daily., Disp: 360 capsule, Rfl: 3 Social History   Socioeconomic History  . Marital status: Married    Spouse name: Not on file  . Number of children: 2  . Years of education: Not on file  . Highest education level: Not on file  Occupational History  . Not  on file  Tobacco Use  . Smoking status: Current Every Day Smoker    Packs/day: 1.50    Years: 20.00    Pack years: 30.00    Types: Cigarettes    Last attempt to quit: 11/26/2017    Years since quitting: 2.5  . Smokeless tobacco: Former Systems developer    Types: Snuff    Quit date: 07/27/2008  Vaping Use  . Vaping Use: Never used  Substance and Sexual Activity  . Alcohol use: Yes    Alcohol/week: 0.0 standard drinks    Comment: socially  . Drug use: No  . Sexual activity: Never  Other Topics Concern  . Not on file  Social History Narrative   ** Merged History Encounter **       Social Determinants of Health   Financial Resource Strain:   . Difficulty of Paying Living Expenses:   Food Insecurity:   . Worried About Charity fundraiser in the Last Year:   . Arboriculturist in the Last Year:   Transportation Needs:   . Film/video editor (Medical):   Marland Kitchen Lack of Transportation (Non-Medical):   Physical Activity:   . Days of Exercise per Week:   . Minutes of Exercise per Session:   Stress:   . Feeling of Stress :   Social Connections:   . Frequency of Communication with Friends and Family:   . Frequency of Social Gatherings with Friends and Family:   . Attends Religious Services:   . Active Member of Clubs or Organizations:   . Attends Archivist Meetings:   Marland Kitchen Marital Status:   Intimate Partner Violence:   . Fear of Current or Ex-Partner:   . Emotionally Abused:   Marland Kitchen Physically Abused:   . Sexually Abused:    Family History  Problem Relation Age of Onset  . Heart attack Father        father died of heart attach in his 23s  . Hyperlipidemia Father   . Early death Father   . Lung cancer Mother   . Hyperlipidemia Mother   . Hyperlipidemia Brother   . Alzheimer's disease Maternal Grandmother   . Colon cancer Neg Hx   . Colon polyps Neg Hx   . Esophageal cancer Neg Hx   . Rectal cancer Neg Hx   . Stomach cancer Neg Hx     Objective: Office vital signs  reviewed. BP (!) 131/80   Pulse 77   Temp 99 F (37.2 C) (Temporal)   Ht '5\' 11"'$  (1.803 m)   Wt (!) 222 lb 6.4 oz (100.9 kg)   SpO2 96%   BMI 31.02 kg/m   Physical Examination:  General: Awake, alert, well nourished, No acute distress HEENT:  Normal; no carotid bruits Cardio: regular rate and rhythm, S1S2 heard, no murmurs appreciated Pulm: clear to auscultation bilaterally, no wheezes, rhonchi or rales; normal work of breathing on room air Extremities: warm, well perfused, No edema, cyanosis or clubbing; +2 pulses bilaterally MSK: normal gait and station; ambulating independently.  No midline lumbar tenderness palpation Skin: dry; intact; no rashes or lesions Neuro: No focal neurologic deficit Psych: Mood stable, speech normal, affect appropriate, pleasant and interactive Depression screen [facility] 2/9 06/20/2020 11/17/2019 06/04/2018  Decreased Interest 0 1 0  Down, Depressed, Hopeless '3 1 1  '$ PHQ - 2 Score '3 2 1  '$ Altered sleeping 0 1 0  Tired, decreased energy 0 2 1  Change in appetite 0 0 1  Feeling bad or failure about yourself  0 2 1  Trouble concentrating 0 2 1  Moving slowly or fidgety/restless 0 1 0  Suicidal thoughts 0 0 0  PHQ-9 Score '3 10 5  '$ Difficult doing work/chores Not difficult at all Somewhat difficult -   GAD 7 : Generalized Anxiety Score 06/20/2020  Nervous, Anxious, on Edge 1  Control/stop worrying 0  Worry too much - different things 0  Trouble relaxing 0  Restless 0  Easily annoyed or irritable 0  Afraid - awful might happen 0  Total GAD 7 Score 1  Anxiety Difficulty Not difficult at all   Assessment/ Plan: 59 y.o. male   1. Hyperlipidemia with target low density lipoprotein (LDL) cholesterol less than 100 mg/dL Plan for fasting labs.  I have discontinued the Lovaza given increased cost.  I will plan for nexlizet if cholesterol continues to be elevated - Lipid panel; Future - CMP14+EGFR; Future - atorvastatin (LIPITOR) 80 MG tablet; Take 1 tablet (80  mg total) by mouth every morning.  Dispense: 90 tablet; Refill: 3 - Ambulatory referral to Cardiology  2. Establishing care with new doctor, encounter for  3. Benign essential HTN Controlled.  Continue current regimen.  I placed Stephen referral to cardiology given high family risk of cardiovascular disease in early death due to MI in his father.  May benefit from interval stress testing given ongoing risk factors of high blood pressure, hyperlipidemia and tobacco use - CMP14+EGFR; Future - losartan (COZAAR) 50 MG tablet; Take 1 tablet (50 mg total) by mouth daily.  Dispense: 90 tablet; Refill: 3 - Ambulatory referral to Cardiology  4. GAD (generalized anxiety disorder) Discussed the risks of benzodiazepine use with opioids.  We are discontinuing the Xanax, despite rare use of the medication, it is risky given his need for ongoing opioids.  I have instead placed him on hydroxyzine.  We discussed indications for use and possible side effects.  Alternative therapies discussed include BuSpar and increasing Cymbalta dose.  Also could consider [facility] testing - ToxASSURE Select 13 (MW), Urine - hydrOXYzine (ATARAX/VISTARIL) 50 MG tablet; Take 0.5-2 tablets (25-100 mg total) by mouth every 8 (eight) hours as needed for anxiety (panic attack).  Dispense: 30 tablet; Refill: 5  5. Back pain with history of spinal surgery Controlled substance contract and urine drug screen obtained today.  Norco sent.  National narcotic database was reviewed and there were no red flags - ToxASSURE Select 13 (MW), Urine - HYDROcodone-acetaminophen (NORCO/VICODIN) 5-325 MG tablet; Take 1 tablet by mouth every 8 (eight) hours as needed for severe pain.  Dispense: 90 tablet; Refill: 0  6. Controlled substance agreement signed - ToxASSURE Select 13 (MW), Urine  7. Encounter for hepatitis C screening test for low risk patient -  Hepatitis C antibody; Future  8. Screening for HIV (human immunodeficiency virus) - HIV antibody  (with reflex); Future  9. Depression with anxiety As above - DULoxetine (CYMBALTA) 60 MG capsule; Take 1 capsule (60 mg total) by mouth daily.  Dispense: 90 capsule; Refill: 1  10. Tobacco use - Ambulatory referral to Cardiology  11. Family history of early CAD - Ambulatory referral to Cardiology   No orders of the defined types were placed in this encounter.  No orders of the defined types were placed in this encounter.    Janora Norlander, DO [place] [phone]

## 2020-06-20 NOTE — Patient Instructions (Signed)
We talked about the dangers of opioid and benzodiazepine use.  It is NOT recommended to use together.  I have renewed your Norco. I have added Vistaril to use as needed for panic attack.  Caution sedation.  Come in for labs.  We will decide what to do about your cholesterol medication.  Considering Nexlizet (has zetia plus another medication) Ok to stop Omega 3's. Continue all other medications.

## 2020-06-22 ENCOUNTER — Other Ambulatory Visit: Payer: Medicare Other

## 2020-06-22 ENCOUNTER — Other Ambulatory Visit: Payer: Self-pay

## 2020-06-22 DIAGNOSIS — E785 Hyperlipidemia, unspecified: Secondary | ICD-10-CM

## 2020-06-22 DIAGNOSIS — I10 Essential (primary) hypertension: Secondary | ICD-10-CM | POA: Diagnosis not present

## 2020-06-22 DIAGNOSIS — Z1159 Encounter for screening for other viral diseases: Secondary | ICD-10-CM

## 2020-06-22 DIAGNOSIS — Z114 Encounter for screening for human immunodeficiency virus [HIV]: Secondary | ICD-10-CM

## 2020-06-22 LAB — TOXASSURE SELECT 13 (MW), URINE

## 2020-06-23 LAB — CMP14+EGFR
ALT: 39 IU/L (ref 0–44)
AST: 31 IU/L (ref 0–40)
Albumin/Globulin Ratio: 1.5 (ref 1.2–2.2)
Albumin: 4.3 g/dL (ref 3.8–4.9)
Alkaline Phosphatase: 105 IU/L (ref 48–121)
BUN/Creatinine Ratio: 18 (ref 9–20)
BUN: 16 mg/dL (ref 6–24)
Bilirubin Total: 0.4 mg/dL (ref 0.0–1.2)
CO2: 23 mmol/L (ref 20–29)
Calcium: 9.5 mg/dL (ref 8.7–10.2)
Chloride: 103 mmol/L (ref 96–106)
Creatinine, Ser: 0.89 mg/dL (ref 0.76–1.27)
GFR calc Af Amer: 109 mL/min/{1.73_m2} (ref >59)
GFR calc non Af Amer: 94 mL/min/{1.73_m2} (ref >59)
Globulin, Total: 2.9 g/dL (ref 1.5–4.5)
Glucose: 111 mg/dL — ABNORMAL HIGH (ref 65–99)
Potassium: 4.1 mmol/L (ref 3.5–5.2)
Sodium: 141 mmol/L (ref 134–144)
Total Protein: 7.2 g/dL (ref 6.0–8.5)

## 2020-06-23 LAB — LIPID PANEL
Chol/HDL Ratio: 3.8 ratio (ref 0.0–5.0)
Cholesterol, Total: 141 mg/dL (ref 100–199)
HDL: 37 mg/dL — ABNORMAL LOW (ref >39)
LDL Chol Calc (NIH): 83 mg/dL (ref 0–99)
Triglycerides: 115 mg/dL (ref 0–149)
VLDL Cholesterol Cal: 21 mg/dL (ref 5–40)

## 2020-06-23 LAB — HIV ANTIBODY (ROUTINE TESTING W REFLEX): HIV Screen 4th Generation wRfx: NONREACTIVE

## 2020-06-23 LAB — HEPATITIS C ANTIBODY: Hep C Virus Ab: <0.1 s/co ratio (ref 0.0–0.9)

## 2020-06-28 ENCOUNTER — Telehealth: Payer: Self-pay | Admitting: *Deleted

## 2020-06-28 ENCOUNTER — Other Ambulatory Visit: Payer: Self-pay | Admitting: Family Medicine

## 2020-06-28 DIAGNOSIS — C678 Malignant neoplasm of overlapping sites of bladder: Secondary | ICD-10-CM | POA: Diagnosis not present

## 2020-06-28 DIAGNOSIS — F418 Other specified anxiety disorders: Secondary | ICD-10-CM

## 2020-06-28 DIAGNOSIS — F411 Generalized anxiety disorder: Secondary | ICD-10-CM

## 2020-06-28 NOTE — Telephone Encounter (Signed)
That's very surprising.  He was just here last week and score low on both the PHQ and GAD.  I will gladly place the referral however.  Please make sure he has the crisis hotline numbers.  Go to ED/ call 911 if any concerns for harm to self or others.

## 2020-06-28 NOTE — Telephone Encounter (Signed)
Pt is having bad depression and anxiety. He would like a referral to [facility] for counseling in [place]. His daughter called but we can give him the appt. [phone]

## 2020-06-29 NOTE — Telephone Encounter (Signed)
Patient aware.

## 2020-07-25 ENCOUNTER — Other Ambulatory Visit: Payer: Self-pay | Admitting: *Deleted

## 2020-07-25 MED ORDER — EZETIMIBE 10 MG PO TABS: 10.0000 mg | ORAL_TABLET | Freq: Every day | ORAL | 1 refills | Status: DC

## 2020-07-27 ENCOUNTER — Other Ambulatory Visit: Payer: Self-pay

## 2020-07-27 ENCOUNTER — Ambulatory Visit (INDEPENDENT_AMBULATORY_CARE_PROVIDER_SITE_OTHER): Payer: Medicare Other | Admitting: Clinical

## 2020-07-27 DIAGNOSIS — F331 Major depressive disorder, recurrent, moderate: Secondary | ICD-10-CM

## 2020-07-27 DIAGNOSIS — F419 Anxiety disorder, unspecified: Secondary | ICD-10-CM | POA: Diagnosis not present

## 2020-07-27 DIAGNOSIS — F4310 Post-traumatic stress disorder, unspecified: Secondary | ICD-10-CM
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
## 2020-08-17 ENCOUNTER — Ambulatory Visit (INDEPENDENT_AMBULATORY_CARE_PROVIDER_SITE_OTHER): Payer: Medicare Other | Admitting: Clinical

## 2020-08-17 ENCOUNTER — Other Ambulatory Visit: Payer: Self-pay

## 2020-08-17 DIAGNOSIS — F419 Anxiety disorder, unspecified: Secondary | ICD-10-CM | POA: Diagnosis not present

## 2020-08-17 DIAGNOSIS — F4310 Post-traumatic stress disorder, unspecified: Secondary | ICD-10-CM

## 2020-08-17 DIAGNOSIS — F331 Major depressive disorder, recurrent, moderate: Secondary | ICD-10-CM

## 2020-08-17 NOTE — Progress Notes (Addendum)
  Virtual Visit via Video Note  I connected withGlen A. [name] on 08/17/20 at  2:00 PM EDT by a video enabled telemedicine application and verified that I am speaking with the correct person using two identifiers.   Location: Patient:Home Provider:Office  I discussed the limitations of evaluation and management by telemedicine and the availability of in person appointments. The patient expressed understanding and agreed to proceed      THERAPIST PROGRESS NOTE  Session Time:2:00PM-2:45PM  Participation Level:Active  Behavioral Response:CasualAlertDepressed  Type of Therapy:Individual Therapy  Treatment Goals addressed:Coping  Interventions:CBT and Strength-based  Summary:[name] is a 59 y.o.malewho presents with PTSD/Depression/Anxiety .The OPT therapist worked with thepatientfor his initial OPT treatment. The OPT therapist utilized Motivational Interviewing to assist in creating therapeutic repore. The patient in the session was engaged and work in collaboration giving feedback about his triggers and symptoms over the past few weeksincluding adjusting to being in a new relationship after recently getting out of a unhealthy relationship.The OPT therapist utilized Cognitive Behavioral Therapy through cognitive restructuring as well as worked with the patient on reviewing history of Depression. The OPT therapist provided psycho-education throughout the session.   Suicidal/Homicidal:Nowithout intent/plan  Therapist Response:The OPT therapist worked with the patient for the patients scheduled session. The patient was engaged in his session and gave feedback in relation to triggers, symptoms, and behavior responses over the pastfewweeks.The patient notedconcern of recovering from previously being in a unhealthy relationship which significantly impacted his mental health.The OPT therapist worked with the patient utilizing an in session  Cognitive Behavioral Therapy exercise. The patient indicated he is in a much better place currently in relation to his current relationship and feeling that he can communicate with his partner.The OPT therapist worked with the patient and processed with the patient the impact of his previous relationship.The OPT therapist will continue treatment work with the patient in his next scheduled session.  Plan: Return again in2/3weeks.  Diagnosis:Axis I:PTSD/Depression/Anxiety Axis II:No diagnosis  I discussed the assessment and treatment plan with the patient. The patient was provided an opportunity to ask questions and all were answered. The patient agreed with the plan and demonstrated an understanding of the instructions.  The patient was advised to call back or seek an in-person evaluation if the symptoms worsen or if the condition fails to improve as anticipated.  I provided53minutes of non-face-to-face time during this encounter.  Lennox Grumbles, LCSW 08/17/2020

## 2020-08-31 ENCOUNTER — Ambulatory Visit (INDEPENDENT_AMBULATORY_CARE_PROVIDER_SITE_OTHER): Payer: Medicare Other | Admitting: Clinical

## 2020-08-31 ENCOUNTER — Other Ambulatory Visit: Payer: Self-pay

## 2020-08-31 DIAGNOSIS — F419 Anxiety disorder, unspecified: Secondary | ICD-10-CM | POA: Diagnosis not present

## 2020-08-31 DIAGNOSIS — F331 Major depressive disorder, recurrent, moderate: Secondary | ICD-10-CM

## 2020-08-31 DIAGNOSIS — F4310 Post-traumatic stress disorder, unspecified: Secondary | ICD-10-CM

## 2020-08-31 NOTE — Progress Notes (Addendum)
°  Virtual Visit via Video Note  I connected withGlen A. Potteron 10/06/21at 2:00 PM EDTby a video enabled telemedicine application and verified that I am speaking with the correct person using two identifiers.   Location: Patient:Home Provider:Office   I discussed the limitations of evaluation and management by telemedicine and the availability of in person appointments. The patient expressed understanding and agreed to proceed    THERAPIST PROGRESS NOTE  Session Time:2:00PM-2:45PM  Participation Level:Active  Behavioral Response:CasualAlertDepressed  Type of Therapy:Individual Therapy  Treatment Goals addressed:Coping  Interventions:CBT and Strength-based  Summary:Stephen A. Potteris a 59y.o.malewho presents with PTSD/Depression/Anxiety .The OPT therapist worked with thepatientfor hisongoingOPT treatment. The OPT therapist utilized Motivational Interviewing to assist in creating therapeutic repore. The patient in the session was engaged and work in collaboration giving feedback about his triggers and symptoms over the past few weeksincluding increased caregiving for his parents and conflict in his new relationship.The OPT therapist utilized Cognitive Behavioral Therapy through cognitive restructuring as well as worked with the patient onreviewing history ofDepression.The OPT therapist provided psycho-education throughout the session as well as worked with the patient on positive communication with his partner.   Suicidal/Homicidal:Nowithout intent/plan  Therapist Response:The OPT therapist worked with the patient for the patients scheduled session. The patient was engaged in his session and gave feedback in relation to triggers, symptoms, and behavior responses over the pastfewweeks.The patient notedconcern of conflict within his current relationship and indication he and his partner may be in 2 different places regarding their  seriousness. The OPT therapist worked with the patient utilizing an in session Cognitive Behavioral Therapy exercise. The patient who previously indicated feeling hear and understood in his relationship noted a recent conflict where he did not feel hear or understood.The OPT therapist worked with the patient on continuing to work on establishing expectations with his partner in their relationship.The OPT therapist will continue treatment work with the patient in his next scheduled session.  Plan: Return again in2/3weeks.  Diagnosis:Axis I:PTSD/Depression/Anxiety Axis II:No diagnosis  I discussed the assessment and treatment plan with the patient. The patient was provided an opportunity to ask questions and all were answered. The patient agreed with the plan and demonstrated an understanding of the instructions.  The patient was advised to call back or seek an in-person evaluation if the symptoms worsen or if the condition fails to improve as anticipated.  I provided17minutes of non-face-to-face time during this encounter.  Lennox Grumbles, LCSW 08/31/2020

## 2020-09-11 NOTE — Progress Notes (Deleted)
Cardiology Office Note   Date:  09/11/2020   ID:  [name], DOB 26-Sep-1961, MRN [id number]  PCP:  Janora Norlander, DO  Cardiologist:   No primary care provider on file. Referring:  ***  No chief complaint on file.     History of Present Illness: [name] is a 59 y.o. male who presents for ***     Past Medical History:  Diagnosis Date  . Acute pancreatitis   . Anxiety   . Atrial flutter ([place]) 09/30/2013  . Benign essential hypertension    per pt denies ,  but is documented in epic by his pcp  . Bladder cancer [facility]) urologist-- dr bell   dx 08/ 2019  s/p  TURBT  . Bladder tumor   . CAD (coronary artery disease)    last cardiac cath 08-12-2014  mild non-obstructive CAD ,  normal LVF  . DDD (degenerative disc disease), lumbar   . Depression   . Diabetes mellitus type 2, diet-controlled ([place])   . Fatty liver   . First degree heart block   . Headache(784.0)   . History of acute pancreatitis 02/2015  . History of kidney stones   . History of syncope   . Hyperlipidemia   . Intermediate coronary syndrome ([place])   . Intermittent palpitations    takes bystolic  . Obesity   . Pancreatic divisum 2016    Past Surgical History:  Procedure Laterality Date  . CARDIAC CATHETERIZATION  10-08-2002;  03-15-2006   mild non-obstructive CAD,  normal LVF  . ENTEROSCOPY N/A 03/20/2015   Procedure: ENTEROSCOPY;  Surgeon: Jerene Bears, MD;  Location: [facility] ENDOSCOPY;  Service: Endoscopy;  Laterality: N/A;  . ERCP  04/2015   stone in pancreatic duct  . LEFT HEART CATHETERIZATION WITH CORONARY ANGIOGRAM N/A 08/12/2014   Procedure: LEFT HEART CATHETERIZATION WITH CORONARY ANGIOGRAM;  Surgeon: Burnell Blanks, MD;  Location: [facility] CATH LAB;  Service: Cardiovascular;  Laterality: N/A;  . POSTERIOR LUMBAR FUSION  2010   L3 -- 5  . TONSILLECTOMY  1979  age 43  . TRANSURETHRAL RESECTION OF BLADDER TUMOR WITH MITOMYCIN-C N/A 07/23/2018   Procedure: TRANSURETHRAL RESECTION OF  BLADDER TUMOR WITH GEMCITABINE;  Surgeon: Lucas Mallow, MD;  Location: [facility] ORS;  Service: Urology;  Laterality: N/A;  . TRANSURETHRAL RESECTION OF BLADDER TUMOR WITH MITOMYCIN-C N/A 12/19/2018   Procedure: TRANSURETHRAL RESECTION OF BLADDER TUMOR WITH GEMCITABINE;  Surgeon: Lucas Mallow, MD;  Location: [facility];  Service: Urology;  Laterality: N/A;     Current Outpatient Medications  Medication Sig Dispense Refill  . aspirin 325 MG tablet Take 325 mg by mouth daily.    Marland Kitchen atorvastatin (LIPITOR) 80 MG tablet Take 1 tablet (80 mg total) by mouth every morning. 90 tablet 3  . DULoxetine (CYMBALTA) 60 MG capsule Take 1 capsule (60 mg total) by mouth daily. 90 capsule 1  . ezetimibe (ZETIA) 10 MG tablet Take 1 tablet (10 mg total) by mouth daily. 90 tablet 1  . HYDROcodone-acetaminophen (NORCO/VICODIN) 5-325 MG tablet Take 1 tablet by mouth every 8 (eight) hours as needed for severe pain. 90 tablet 0  . hydrOXYzine (ATARAX/VISTARIL) 50 MG tablet Take 0.5-2 tablets (25-100 mg total) by mouth every 8 (eight) hours as needed for anxiety (panic attack). 30 tablet 5  . losartan (COZAAR) 50 MG tablet Take 1 tablet (50 mg total) by mouth daily. 90 tablet 3  . Misc. Devices MISC Initiate  cpap at 15 cm of water pressure new machine and supplies DME company [place]     No current facility-administered medications for this visit.    Allergies:   Patient has no known allergies.    Social History:  The patient  reports that he has been smoking cigarettes. He has a 30.00 pack-year smoking history. He quit smokeless tobacco use about 12 years ago.  His smokeless tobacco use included snuff. He reports current alcohol use. He reports that he does not use drugs.   Family History:  The patient's ***family history includes Alzheimer's disease in his maternal grandmother; Early death in his father; Heart attack in his father; Hyperlipidemia in his brother, father, and mother; Lung  cancer in his mother.    ROS:  Please see the history of present illness.   Otherwise, review of systems are positive for {NONE DEFAULTED:18576::"none"}.   All other systems are reviewed and negative.    PHYSICAL EXAM: VS:  There were no vitals taken for this visit. , BMI There is no height or weight on file to calculate BMI. GENERAL:  Well appearing HEENT:  Pupils equal round and reactive, fundi not visualized, oral mucosa unremarkable NECK:  No jugular venous distention, waveform within normal limits, carotid upstroke brisk and symmetric, no bruits, no thyromegaly LYMPHATICS:  No cervical, inguinal adenopathy LUNGS:  Clear to auscultation bilaterally BACK:  No CVA tenderness CHEST:  Unremarkable HEART:  PMI not displaced or sustained,S1 and S2 within normal limits, no S3, no S4, no clicks, no rubs, *** murmurs ABD:  Flat, positive bowel sounds normal in frequency in pitch, no bruits, no rebound, no guarding, no midline pulsatile mass, no hepatomegaly, no splenomegaly EXT:  2 plus pulses throughout, no edema, no cyanosis no clubbing SKIN:  No rashes no nodules NEURO:  Cranial nerves II through XII grossly intact, motor grossly intact throughout PSYCH:  Cognitively intact, oriented to person place and time    EKG:  EKG {ACTION; IS/IS PJK:[id number]} ordered today. The ekg ordered today demonstrates ***   Recent Labs: 06/22/2020: ALT 39; BUN 16; Creatinine, Ser 0.89; Potassium 4.1; Sodium 141    Lipid Panel    Component Value Date/Time   CHOL 141 06/22/2020 1154   TRIG 115 06/22/2020 1154   TRIG 133 09/30/2013 1153   HDL 37 (L) 06/22/2020 1154   HDL 41 09/30/2013 1153   CHOLHDL 3.8 06/22/2020 1154   CHOLHDL 7.8 08/12/2014 0342   VLDL 78 (H) 08/12/2014 0342   LDLCALC 83 06/22/2020 1154   LDLCALC 98 09/30/2013 1153      Wt Readings from Last 3 Encounters:  06/20/20 (!) 222 lb 6.4 oz (100.9 kg)  07/03/19 233 lb (105.7 kg)  12/19/18 220 lb 12.8 oz (100.2 kg)      Other  studies Reviewed: Additional studies/ records that were reviewed today include: ***. Review of the above records demonstrates:  Please see elsewhere in the note.  ***   ASSESSMENT AND PLAN:  HTN:  ***  DYSLIPIDEMIA:  ***    Current medicines are reviewed at length with the patient today.  The patient {ACTIONS; HAS/DOES NOT HAVE:19233} concerns regarding medicines.  The following changes have been made:  {PLAN; NO CHANGE:13088:s}  Labs/ tests ordered today include: *** No orders of the defined types were placed in this encounter.    Disposition:   FU with ***    Signed, Minus Breeding, MD  09/11/2020 4:23 PM    [facility]

## 2020-09-14 ENCOUNTER — Ambulatory Visit: Payer: Medicare Other | Admitting: Cardiology

## 2020-09-14 DIAGNOSIS — E785 Hyperlipidemia, unspecified: Secondary | ICD-10-CM

## 2020-09-14 DIAGNOSIS — I10 Essential (primary) hypertension: Secondary | ICD-10-CM

## 2020-09-21 ENCOUNTER — Other Ambulatory Visit: Payer: Self-pay

## 2020-09-21 ENCOUNTER — Ambulatory Visit (INDEPENDENT_AMBULATORY_CARE_PROVIDER_SITE_OTHER): Payer: Medicare Other | Admitting: Clinical

## 2020-09-21 DIAGNOSIS — F419 Anxiety disorder, unspecified: Secondary | ICD-10-CM | POA: Diagnosis not present

## 2020-09-21 DIAGNOSIS — F4310 Post-traumatic stress disorder, unspecified: Secondary | ICD-10-CM

## 2020-09-21 DIAGNOSIS — F331 Major depressive disorder, recurrent, moderate: Secondary | ICD-10-CM | POA: Diagnosis not present

## 2020-09-21 NOTE — Progress Notes (Addendum)
  Virtual Visit via Video Note  I connected withGlen A. Potteron 10/27/21at11:00 AM EDTby a video enabled telemedicine application and verified that I am speaking with the correct person using two identifiers.   Location: Patient:Home Provider:Office  I discussed the limitations of evaluation and management by telemedicine and the availability of in person appointments. The patient expressed understanding and agreed to proceed      THERAPIST PROGRESS NOTE  Session Time:11:00AM-11:45AM  Participation Level:Active  Behavioral Response:CasualAlertDepressed  Type of Therapy:Individual Therapy  Treatment Goals addressed:Coping  Interventions:CBT and Strength-based  Summary:Stephen A. Potteris a59y.o.malewho presents withPTSD/Depression/Anxiety.The OPT therapist worked with thepatientfor hisongoingOPT treatment. The OPT therapist utilized Motivational Interviewing to assist in creating therapeutic repore. The patient in the session was engaged and work in collaboration giving feedback about his triggers and symptoms over the past few weeksincludingincreased stress causing the patients nightmares and symptoms to be increased.The OPT therapist utilized Cognitive Behavioral Therapy through cognitive restructuring as well as worked with the patient onreviewing current stressors and working with the patient in implementing coping strategies.The OPT therapist provided psycho-education throughout the session as well as worked with the patient on positive communication with his partner.  The patient over viewed working to communicate with his partner about his need for time to self as this is partner.    Suicidal/Homicidal:Nowithout intent/plan  Therapist Response:The OPT therapist worked with the patient for the patients scheduled session. The patient was engaged in his session and gave feedback in relation to triggers, symptoms, and behavior  responses over the pastfewweeks.The patient notedconcernof conflict within his current relationship and indication he and his partner  Still are may  in 2 different places regarding their seriousness in the relationship. The OPT therapist worked with the patient utilizing an in session Cognitive Behavioral Therapy exercise. The patient identified part of his increased recent stress and triggering has come from the relationship he is currently in moving faster than he has been able to adjust to.The OPT therapist worked with the patient on continuing to work on establishing expectations with his partner in their relationship. The patient agreed to start putting himself first and taking the time he needs to help clear his head and balance his stressors so that he can be healthier and a better version for himself and of himself for others.The OPT therapist will continue treatment work with the patient in his next scheduled session.  Plan: Return again in2/3weeks.  Diagnosis:Axis I:PTSD/Depression/Anxiety Axis II:No diagnosis  I discussed the assessment and treatment plan with the patient. The patient was provided an opportunity to ask questions and all were answered. The patient agreed with the plan and demonstrated an understanding of the instructions.  The patient was advised to call back or seek an in-person evaluation if the symptoms worsen or if the condition fails to improve as anticipated.  I provided101minutes of non-face-to-face time during this encounter.  Stephen Grumbles, LCSW 09/21/2020

## 2020-09-27 DIAGNOSIS — K861 Other chronic pancreatitis: Secondary | ICD-10-CM | POA: Diagnosis not present

## 2020-09-27 DIAGNOSIS — N200 Calculus of kidney: Secondary | ICD-10-CM | POA: Diagnosis not present

## 2020-09-27 DIAGNOSIS — Z8551 Personal history of malignant neoplasm of bladder: Secondary | ICD-10-CM | POA: Diagnosis not present

## 2020-09-27 DIAGNOSIS — C678 Malignant neoplasm of overlapping sites of bladder: Secondary | ICD-10-CM | POA: Diagnosis not present

## 2020-09-27 DIAGNOSIS — K5730 Diverticulosis of large intestine without perforation or abscess without bleeding: Secondary | ICD-10-CM | POA: Diagnosis not present

## 2020-10-12 ENCOUNTER — Other Ambulatory Visit: Payer: Self-pay

## 2020-10-12 ENCOUNTER — Ambulatory Visit (INDEPENDENT_AMBULATORY_CARE_PROVIDER_SITE_OTHER): Payer: Medicare Other | Admitting: Clinical

## 2020-10-12 DIAGNOSIS — F331 Major depressive disorder, recurrent, moderate: Secondary | ICD-10-CM | POA: Diagnosis not present

## 2020-10-12 DIAGNOSIS — F4310 Post-traumatic stress disorder, unspecified: Secondary | ICD-10-CM | POA: Diagnosis not present

## 2020-10-12 DIAGNOSIS — F419 Anxiety disorder, unspecified: Secondary | ICD-10-CM

## 2020-10-12 NOTE — Progress Notes (Signed)
  Virtual Visit via Video Note  I connected with [name] on 10/12/20 at 11:00 AM EST by a video enabled telemedicine application and verified that I am speaking with the correct person using two identifiers.  Location: Patient: Home  Provider: Office   I discussed the limitations of evaluation and management by telemedicine and the availability of in person appointments. The patient expressed understanding and agreed to proceed.   THERAPIST PROGRESS NOTE  Session Time:11:00AM-11:40AM  Participation Level:Active  Behavioral Response:CasualAlertDepressed  Type of Therapy:Individual Therapy  Treatment Goals addressed:Coping  Interventions:CBT and Strength-based  Summary:Glen A. Potteris a59y.o.malewho presents withPTSD/Depression/Anxiety.The OPT therapist worked with thepatientfor hisongoingOPT treatment. The OPT therapist utilized Motivational Interviewing to assist in creating therapeutic repore. The patient in the session was engaged and work in collaboration giving feedback about his triggers and symptoms over the past few weeksincluding caregiving for his Mother who has dementia.The OPT therapist utilized Cognitive Behavioral Therapy through cognitive restructuring as well as worked with the patient onreviewing current stressors and working with the patient in implementing coping strategies.The OPT therapist provided psycho-education throughout the session.  Suicidal/Homicidal:Nowithout intent/plan  Therapist Response:The OPT therapist worked with the patient for the patients scheduled session. The patient was engaged in his session and gave feedback in relation to triggers, symptoms, and behavior responses over the pastfewweeks.The patient notedconcernofdifficulty compartmentalizing and being triggered by caregiving for his Mother who has Dementia.The OPT therapist worked with the patient utilizing an in session Cognitive Behavioral  Therapy exercise.The OPT therapist worked with the patienton compartmentalizing and not placing unreasable expectations or blame on himself. The patient agreed to continue to work on balance his stressors so that he can be healthier and a better version for himself and of himself for others.The OPT therapist will continue treatment work with the patient in his next scheduled session.  Plan: Return again in2/3weeks.  Diagnosis:Axis I:PTSD/Depression/Anxiety Axis II:No diagnosis  I discussed the assessment and treatment plan with the patient. The patient was provided an opportunity to ask questions and all were answered. The patient agreed with the plan and demonstrated an understanding of the instructions.  The patient was advised to call back or seek an in-person evaluation if the symptoms worsen or if the condition fails to improve as anticipated.  I provided59minutes of non-face-to-face time during this encounter.  Lennox Grumbles, LCSW 10/12/2020

## 2020-10-13 DIAGNOSIS — C678 Malignant neoplasm of overlapping sites of bladder: Secondary | ICD-10-CM | POA: Diagnosis not present

## 2020-11-01 ENCOUNTER — Ambulatory Visit (INDEPENDENT_AMBULATORY_CARE_PROVIDER_SITE_OTHER): Payer: Medicare Other | Admitting: Clinical

## 2020-11-01 ENCOUNTER — Other Ambulatory Visit: Payer: Self-pay

## 2020-11-01 DIAGNOSIS — F331 Major depressive disorder, recurrent, moderate: Secondary | ICD-10-CM | POA: Diagnosis not present

## 2020-11-01 DIAGNOSIS — F4310 Post-traumatic stress disorder, unspecified: Secondary | ICD-10-CM | POA: Diagnosis not present

## 2020-11-01 DIAGNOSIS — F419 Anxiety disorder, unspecified: Secondary | ICD-10-CM

## 2020-11-01 NOTE — Progress Notes (Signed)
Virtual Visit via Telephone Note  I connected with [name] on 11/01/20 at 11:00 AM EST by telephone and verified that I am speaking with the correct person using two identifiers.  Location: Patient: Home  Provider: Office  I discussed the limitations of evaluation and management by telemedicine and the availability of in person appointments. The patient expressed understanding and agreed to proceed.   THERAPIST PROGRESS NOTE  Session Time:11:00AM-11:40AM  Participation Level:Active  Behavioral Response:CasualAlertDepressed  Type of Therapy:Individual Therapy  Treatment Goals addressed:Coping  Interventions:CBT and Strength-based  Summary:Stephen A. Potteris a59y.o.malewho presents withPTSD/Depression/Anxiety.The OPT therapist worked with thepatientfor hisongoingOPT treatment. The OPT therapist utilized Motivational Interviewing to assist in creating therapeutic repore. The patient in the session was engaged and work in collaboration giving feedback about his triggers and symptoms over the past few weeksincluding interactions with his ex-wife.The OPT therapist utilized Cognitive Behavioral Therapy through cognitive restructuring as well as worked with the patient onreviewingcurrent stressors and working with the patient in implementing coping strategies.The OPT therapist provided psycho-education throughout the session. The patient gave feedback about how implementing coping has been helping him manage his mental health symptoms  Suicidal/Homicidal:Nowithout intent/plan  Therapist Response:The OPT therapist worked with the patient for the patients scheduled session. The patient was engaged in his session and gave feedback in relation to triggers, symptoms, and behavior responses over the pastfewweeks.The patient noteddifficulty in interactions with his ex-wife.The OPT therapist worked with the patient utilizing an in session Cognitive  Behavioral Therapy exercise.The OPT therapist worked with the patienton maintaining barriers, implementing coping, and continuing to utilize communication with his current partner.The patient agreed to continue to work on balance his stressors through implementing reviewed coping skills.The OPT therapist will continue treatment work with the patient in his next scheduled session.  Plan: Return again in2/3weeks.  Diagnosis:Axis I:PTSD/Depression/Anxiety Axis II:No diagnosis  I discussed the assessment and treatment plan with the patient. The patient was provided an opportunity to ask questions and all were answered. The patient agreed with the plan and demonstrated an understanding of the instructions.  The patient was advised to call back or seek an in-person evaluation if the symptoms worsen or if the condition fails to improve as anticipated.  I provided34minutes of non-face-to-face time during this encounter.  Lennox Grumbles, LCSW 11/01/2020

## 2020-11-28 ENCOUNTER — Ambulatory Visit (INDEPENDENT_AMBULATORY_CARE_PROVIDER_SITE_OTHER): Payer: Medicare Other | Admitting: Clinical

## 2020-11-28 ENCOUNTER — Other Ambulatory Visit: Payer: Self-pay

## 2020-11-28 DIAGNOSIS — F419 Anxiety disorder, unspecified: Secondary | ICD-10-CM

## 2020-11-28 DIAGNOSIS — F4310 Post-traumatic stress disorder, unspecified: Secondary | ICD-10-CM

## 2020-11-28 DIAGNOSIS — F331 Major depressive disorder, recurrent, moderate: Secondary | ICD-10-CM | POA: Diagnosis not present

## 2020-11-28 NOTE — Progress Notes (Signed)
  Virtual Visit via Telephone Note  I connected withGlenn A [name] on 11/28/20 at 1:00 PM EST by telephoneand verified that I am speaking with the correct person using two identifiers.  Location: Patient:Home Provider:Office  I discussed the limitations of evaluation and management by telemedicine and the availability of in person appointments. The patient expressed understanding and agreed to proceed.   THERAPIST PROGRESS NOTE  Session Time:1:00PM-1:30PM  Participation Level:Active  Behavioral Response:CasualAlertDepressed  Type of Therapy:Individual Therapy  Treatment Goals addressed:Coping  Interventions:CBT and Strength-based  Summary:[name] a59y.o.malewho presents withPTSD/Depression/Anxiety.The OPT therapist worked with thepatientfor hisongoingOPT treatment. The OPT therapist utilized Motivational Interviewing to assist in creating therapeutic repore. The patient in the session was engaged and work in collaboration giving feedback about his triggers and symptoms over the past few weeksincluding interactions with his siblings in relation to care taking for his parentsThe OPT therapist utilized Engineer, manufacturing systems Therapy through cognitive restructuring as well as worked with the patient onreviewingcurrent stressors and working with the patient in implementing coping strategies.The OPT therapist provided psycho-education throughout the session. The patient gave feedback about how implementing coping has continued  helping him manage his mental health symptoms this and communication has helped him in managing his relationship with his girlfriend and family members.  Suicidal/Homicidal:Nowithout intent/plan  Therapist Response:The OPT therapist worked with the patient for the patients scheduled session. The patient was engaged in his session and gave feedback in relation to triggers, symptoms, and behavior responses over the  pastfewweeks.The patient noteddifficulty in interactions with his brother and not being on the same page in caretaking with his parents including management of his parents finances.The OPT therapist worked with the patient utilizing an in session Cognitive Behavioral Therapy exercise.The OPT therapist worked with the patientonmaintaining barriers, implementing coping, and continuing to utilize communication with his girlfriend and family.The patient agreed tocontinue to work onbalance his stressors through implementing reviewed coping skills.The OPT therapist worked with the patient on implementing a organizational tool to assist with his caretakeing responsibilities.The OPT therapist will continue treatment work with the patient in his next scheduled session.  Plan: Return again in2/3weeks.  Diagnosis:  Axis I:PTSD/Depression/Anxiety Axis II:No diagnosis  I discussed the assessment and treatment plan with the patient. The patient was provided an opportunity to ask questions and all were answered. The patient agreed with the plan and demonstrated an understanding of the instructions.  The patient was advised to call back or seek an in-person evaluation if the symptoms worsen or if the condition fails to improve as anticipated.  I provided26minutes of non-face-to-face time during this encounter.  Suzan Garibaldi, LCSW 11/28/2020

## 2020-12-14 ENCOUNTER — Ambulatory Visit (INDEPENDENT_AMBULATORY_CARE_PROVIDER_SITE_OTHER): Payer: Medicare Other | Admitting: Clinical

## 2020-12-14 ENCOUNTER — Other Ambulatory Visit: Payer: Self-pay

## 2020-12-14 DIAGNOSIS — F331 Major depressive disorder, recurrent, moderate: Secondary | ICD-10-CM

## 2020-12-14 DIAGNOSIS — F4310 Post-traumatic stress disorder, unspecified: Secondary | ICD-10-CM | POA: Diagnosis not present

## 2020-12-14 DIAGNOSIS — F419 Anxiety disorder, unspecified: Secondary | ICD-10-CM

## 2020-12-14 NOTE — Progress Notes (Signed)
Virtual Visit via Video Note  I connected with [name] on 12/14/20 at  1:00 PM EST by a video enabled telemedicine application and verified that I am speaking with the correct person using two identifiers.  Location: Patient: Home Provider: Office   I discussed the limitations of evaluation and management by telemedicine and the availability of in person appointments. The patient expressed understanding and agreed to proceed.    THERAPIST PROGRESS NOTE  Session Time:1:00PM-1:30PM  Participation Level:Active  Behavioral Response:CasualAlertDepressed  Type of Therapy:Individual Therapy  Treatment Goals addressed:Coping  Interventions:CBT and Strength-based  Summary:Glen A. Potteris a59y.o.malewho presents withPTSD/Depression/Anxiety.The OPT therapist worked with thepatientfor hisongoingOPT treatment. The OPT therapist utilized Motivational Interviewing to assist in creating therapeutic repore. The patient in the session was engaged and work in collaboration giving feedback about his triggers and symptoms over the past few weeks.The OPT therapist utilized Cognitive Behavioral Therapy through cognitive restructuring as well as worked with the patient onreviewingcurrent stressors including recent inclement weather.The OPT therapist provided psycho-education throughout the session.The patient gave feedback about how implementing coping has continued  helping him manage his mental health symptoms this and ongoing work on communication has helped him in his interactions with others.  Suicidal/Homicidal:Nowithout intent/plan  Therapist Response:The OPT therapist worked with the patient for the patients scheduled session. The patient was engaged in his session and gave feedback in relation to triggers, symptoms, and behavior responses over the pastfewweeks.The patient noted improvementsin interactions with others.The OPT therapist worked with the  patient utilizing an in session Cognitive Behavioral Therapy exercise.The OPT therapist worked with the [place] barriers, implementing coping, and continuing to utilize communication with his girlfriend and family.The patient agreed tocontinue to work on implementing reviewed coping skills.The OPT therapist worked with the patient on continuing self check ins and self care.The OPT therapist will continue treatment work with the patient in his next scheduled session.  Plan: Return again in2/3weeks.  Diagnosis:  Axis I:PTSD/Depression/Anxiety Axis II:No diagnosis  I discussed the assessment and treatment plan with the patient. The patient was provided an opportunity to ask questions and all were answered. The patient agreed with the plan and demonstrated an understanding of the instructions.  The patient was advised to call back or seek an in-person evaluation if the symptoms worsen or if the condition fails to improve as anticipated.  I provided29minutes of non-face-to-face time during this encounter.  Maye Hides, LCSW  12/14/2020

## 2021-01-11 ENCOUNTER — Ambulatory Visit (INDEPENDENT_AMBULATORY_CARE_PROVIDER_SITE_OTHER): Payer: Medicare Other | Admitting: Clinical

## 2021-01-11 ENCOUNTER — Other Ambulatory Visit: Payer: Self-pay

## 2021-01-11 DIAGNOSIS — F4310 Post-traumatic stress disorder, unspecified: Secondary | ICD-10-CM

## 2021-01-11 DIAGNOSIS — F419 Anxiety disorder, unspecified: Secondary | ICD-10-CM

## 2021-01-11 DIAGNOSIS — F331 Major depressive disorder, recurrent, moderate: Secondary | ICD-10-CM

## 2021-01-11 NOTE — Progress Notes (Signed)
  Virtual Visit via Video Note  I connected withGlenn A [name] on 01/11/21 at  1:00 PM EST by a video enabled telemedicine application and verified that I am speaking with the correct person using two identifiers.  Location: Patient: Home Provider: Office  I discussed the limitations of evaluation and management by telemedicine and the availability of in person appointments. The patient expressed understanding and agreed to proceed.    THERAPIST PROGRESS NOTE  Session Time:1:00PM-1:30PM  Participation Level:Active  Behavioral Response:CasualAlertDepressed  Type of Therapy:Individual Therapy  Treatment Goals addressed:Coping  Interventions:CBT and Strength-based  Summary:Stephen A. Potteris [email or web address].[email or web address].malewho presents withPTSD/Depression/Anxiety.The OPT therapist worked with thepatientfor hisongoingOPT treatment. The OPT therapist utilized Motivational Interviewing to assist in creating therapeutic repore. The patient in the session was engaged and work in collaboration giving feedback about his triggers and symptoms over the past few weeks.The OPT therapist utilized Cognitive Behavioral Therapy through cognitive restructuring as well as worked with the patient onreviewingcurrent stressors his use of coping strategies to manage stress around caregiving for his parents.The OPT therapist provided psycho-education throughout the session.The patient gave feedback about how implementing coping that hascontinuedhelping him manage his mental health symptoms this and ongoing work on communication has helped him in his interactions with others including his girlfriend. The patient agreed that he is currently prepared with the progress maid in the OPT program for Discharge.  Suicidal/Homicidal:Nowithout intent/plan  Therapist Response:The OPT therapist worked with the patient for the patients scheduled session. The patient was engaged in his session and gave  feedback in relation to triggers, symptoms, and behavior responses over the pastfewweeks.The patient noted improvementsin interactions with others.The OPT therapist worked with the patient utilizing an in session Cognitive Behavioral Therapy exercise.The OPT therapist worked with the [place] barriers, implementing coping, and continuing to utilize communication with hisgirlfriend and family.The patient agreed tocontinue to implement reviewed coping skills.The OPT therapist worked with the patient on continuing self check ins and self care.The OPT therapist agreed with the patients progress he has shown he has been able to meet his treatment plan goal with consistency and agreed its an appropriate time to Discharge Plan: Return again in2/3weeks.  Diagnosis:  Axis I:PTSD/Depression/Anxiety Axis II:No diagnosis  Patient Follow- up - No additional appointment needed as patient is a successful Discharge   I discussed the assessment and treatment plan with the patient. The patient was provided an opportunity to ask questions and all were answered. The patient agreed with the plan and demonstrated an understanding of the instructions.  The patient was advised to call back or seek an in-person evaluation if the symptoms worsen or if the condition fails to improve as anticipated.  I provided58minutes of non-face-to-face time during this encounter.  Stephen Hides, LCSW  01/11/2021

## 2021-01-13 DIAGNOSIS — C678 Malignant neoplasm of overlapping sites of bladder: Secondary | ICD-10-CM | POA: Diagnosis not present

## 2021-01-23 ENCOUNTER — Other Ambulatory Visit: Payer: Self-pay | Admitting: Family Medicine

## 2021-02-08 ENCOUNTER — Ambulatory Visit (HOSPITAL_COMMUNITY): Payer: Medicare Other | Admitting: Clinical

## 2021-03-08 ENCOUNTER — Other Ambulatory Visit: Payer: Self-pay | Admitting: Family Medicine

## 2021-03-08 DIAGNOSIS — F418 Other specified anxiety disorders: Secondary | ICD-10-CM

## 2021-03-15 DIAGNOSIS — H40013 Open angle with borderline findings, low risk, bilateral: Secondary | ICD-10-CM | POA: Diagnosis not present

## 2021-03-30 DIAGNOSIS — H40013 Open angle with borderline findings, low risk, bilateral: Secondary | ICD-10-CM | POA: Diagnosis not present

## 2021-04-19 ENCOUNTER — Other Ambulatory Visit: Payer: Self-pay | Admitting: Family Medicine

## 2021-04-19 DIAGNOSIS — F418 Other specified anxiety disorders: Secondary | ICD-10-CM

## 2021-04-20 NOTE — Telephone Encounter (Signed)
[place]. NTBS 30 days given 03/08/21

## 2021-04-26 ENCOUNTER — Other Ambulatory Visit: Payer: Self-pay | Admitting: Family Medicine

## 2021-04-26 ENCOUNTER — Telehealth: Payer: Self-pay | Admitting: Family Medicine

## 2021-04-26 DIAGNOSIS — F418 Other specified anxiety disorders: Secondary | ICD-10-CM

## 2021-04-26 MED ORDER — DULOXETINE HCL 60 MG PO CPEP: 60.0000 mg | ORAL_CAPSULE | Freq: Every day | ORAL | 0 refills | Status: DC

## 2021-04-26 NOTE — Telephone Encounter (Signed)
Med sent in for pt

## 2021-05-23 DIAGNOSIS — M19011 Primary osteoarthritis, right shoulder: Secondary | ICD-10-CM | POA: Diagnosis not present

## 2021-05-23 DIAGNOSIS — M75101 Unspecified rotator cuff tear or rupture of right shoulder, not specified as traumatic: Secondary | ICD-10-CM | POA: Diagnosis not present

## 2021-06-09 DIAGNOSIS — H40013 Open angle with borderline findings, low risk, bilateral: Secondary | ICD-10-CM | POA: Diagnosis not present

## 2021-06-14 ENCOUNTER — Encounter: Payer: Self-pay | Admitting: Family Medicine

## 2021-06-14 ENCOUNTER — Ambulatory Visit (INDEPENDENT_AMBULATORY_CARE_PROVIDER_SITE_OTHER): Payer: Medicare Other | Admitting: Family Medicine

## 2021-06-14 ENCOUNTER — Other Ambulatory Visit: Payer: Self-pay

## 2021-06-14 VITALS — BP 138/91 | HR 79 | Temp 98.2°F | Ht 71.0 in | Wt 217.4 lb

## 2021-06-14 DIAGNOSIS — I10 Essential (primary) hypertension: Secondary | ICD-10-CM

## 2021-06-14 DIAGNOSIS — Z79899 Other long term (current) drug therapy: Secondary | ICD-10-CM

## 2021-06-14 DIAGNOSIS — Z87891 Personal history of nicotine dependence: Secondary | ICD-10-CM

## 2021-06-14 DIAGNOSIS — Z125 Encounter for screening for malignant neoplasm of prostate: Secondary | ICD-10-CM

## 2021-06-14 DIAGNOSIS — Z9889 Other specified postprocedural states: Secondary | ICD-10-CM

## 2021-06-14 DIAGNOSIS — M549 Dorsalgia, unspecified: Secondary | ICD-10-CM | POA: Diagnosis not present

## 2021-06-14 DIAGNOSIS — F418 Other specified anxiety disorders: Secondary | ICD-10-CM | POA: Diagnosis not present

## 2021-06-14 DIAGNOSIS — E785 Hyperlipidemia, unspecified: Secondary | ICD-10-CM | POA: Diagnosis not present

## 2021-06-14 DIAGNOSIS — F411 Generalized anxiety disorder: Secondary | ICD-10-CM

## 2021-06-14 MED ORDER — EZETIMIBE 10 MG PO TABS: 10.0000 mg | ORAL_TABLET | Freq: Every day | ORAL | 3 refills | Status: DC

## 2021-06-14 MED ORDER — HYDROCODONE-ACETAMINOPHEN 5-325 MG PO TABS: 1.0000 | ORAL_TABLET | Freq: Three times a day (TID) | ORAL | 0 refills | Status: DC | PRN

## 2021-06-14 MED ORDER — ATORVASTATIN CALCIUM 80 MG PO TABS: 80.0000 mg | ORAL_TABLET | Freq: Every morning | ORAL | 3 refills | Status: DC

## 2021-06-14 MED ORDER — LOSARTAN POTASSIUM 50 MG PO TABS: 50.0000 mg | ORAL_TABLET | Freq: Every day | ORAL | 3 refills | Status: DC

## 2021-06-14 MED ORDER — HYDROXYZINE HCL 50 MG PO TABS: 25.0000 mg | ORAL_TABLET | Freq: Three times a day (TID) | ORAL | 5 refills | Status: DC | PRN

## 2021-06-14 MED ORDER — DULOXETINE HCL 60 MG PO CPEP: 60.0000 mg | ORAL_CAPSULE | Freq: Every day | ORAL | 3 refills | Status: DC

## 2021-06-14 NOTE — Progress Notes (Signed)
Subjective: CC: GAD/ Depression, low back pain PCP: Janora Norlander, DO [name] is a 60 y.o. male presenting to clinic today for:  1. GAD/ depression Reports stability of anxiety and depression with Cymbalta.  Needs refills  2. Low back pain, chronic Continues to have intermittent low back pain where he is using Norco roughly 4 days/week but occasionally he uses no pills per week.  He has got 2 tablets left of the #90 though he was prescribed in July 2021.  Denies any excessive daytime sedation, falls, respiratory depression, visual or auditory hallucinations or constipation  3. HTN, tobacco use d/o Compliant with Cozaar 50 mg daily.  No chest pain, shortness of breath or edema.  He is in fact discontinued use of tobacco as of August 2021.  He would like to consider lung cancer screening given his very long tobacco history which was greater than 30 pack years.  No hemoptysis, shortness of breath, unplanned weight loss, difficulty swallowing or night sweats   ROS: Per HPI  No Known Allergies Past Medical History:  Diagnosis Date   Acute pancreatitis    Anxiety    Atrial flutter ([place]) 09/30/2013   Benign essential hypertension    per pt denies ,  but is documented in epic by his pcp   Bladder cancer [facility]) urologist-- dr bell   dx 08/ 2019  s/p  TURBT   Bladder tumor    CAD (coronary artery disease)    last cardiac cath 08-12-2014  mild non-obstructive CAD ,  normal LVF   DDD (degenerative disc disease), lumbar    Depression    Diabetes mellitus type 2, diet-controlled ([place])    Fatty liver    First degree heart block    Headache(784.0)    History of acute pancreatitis 02/2015   History of kidney stones    History of syncope    Hyperlipidemia    Intermediate coronary syndrome (HCC)    Intermittent palpitations    takes bystolic   Obesity    Pancreatic divisum 2016    Current Outpatient Medications:    aspirin 325 MG tablet, Take 325 mg by mouth daily.,  Disp: , Rfl:    atorvastatin (LIPITOR) 80 MG tablet, Take 1 tablet (80 mg total) by mouth every morning., Disp: 90 tablet, Rfl: 3   DULoxetine (CYMBALTA) 60 MG capsule, Take 1 capsule (60 mg total) by mouth daily. (NEEDS TO BE SEEN BEFORE NEXT REFILL), Disp: 90 capsule, Rfl: 0   ezetimibe (ZETIA) 10 MG tablet, TAKE 1 TABLET DAILY, Disp: 90 tablet, Rfl: 0   HYDROcodone-acetaminophen (NORCO/VICODIN) 5-325 MG tablet, Take 1 tablet by mouth every 8 (eight) hours as needed for severe pain., Disp: 90 tablet, Rfl: 0   hydrOXYzine (ATARAX/VISTARIL) 50 MG tablet, Take 0.5-2 tablets (25-100 mg total) by mouth every 8 (eight) hours as needed for anxiety (panic attack)., Disp: 30 tablet, Rfl: 5   losartan (COZAAR) 50 MG tablet, Take 1 tablet (50 mg total) by mouth daily., Disp: 90 tablet, Rfl: 3   Misc. Devices MISC, Initiate cpap at 15 cm of water pressure new machine and supplies DME company [employer], Disp: , Rfl:  Social History   Socioeconomic History   Marital status: Married    Spouse name: Not on file   Number of children: 2   Years of education: Not on file   Highest education level: Not on file  Occupational History   Not on file  Tobacco Use   Smoking status: Every  Day    Packs/day: 1.50    Years: 20.00    Pack years: 30.00    Types: Cigarettes    Last attempt to quit: 11/26/2017    Years since quitting: 3.5   Smokeless tobacco: Former    Types: Snuff    Quit date: 07/27/2008  Vaping Use   Vaping Use: Never used  Substance and Sexual Activity   Alcohol use: Yes    Alcohol/week: 0.0 standard drinks    Comment: socially   Drug use: No   Sexual activity: Never  Other Topics Concern   Not on file  Social History Narrative   ** Merged History Encounter **       Social Determinants of Health   Financial Resource Strain: Not on file  Food Insecurity: Not on file  Transportation Needs: Not on file  Physical Activity: Not on file  Stress: Not on file  Social Connections: Not  on file  Intimate Partner Violence: Not on file   Family History  Problem Relation Age of Onset   Heart attack Father        father died of heart attach in his 70s   Hyperlipidemia Father    Early death Father    Lung cancer Mother    Hyperlipidemia Mother    Hyperlipidemia Brother    Alzheimer's disease Maternal Grandmother    Colon cancer Neg Hx    Colon polyps Neg Hx    Esophageal cancer Neg Hx    Rectal cancer Neg Hx    Stomach cancer Neg Hx     Objective: Office vital signs reviewed. BP (!) 138/91   Pulse 79   Temp 98.2 F (36.8 C)   Ht 5' 11" (1.803 m)   Wt 217 lb 6.4 oz (98.6 kg)   SpO2 95%   BMI 30.32 kg/m   Physical Examination:  General: Awake, alert, well nourished, No acute distress HEENT: Normal; sclera white.  Moist mucous membranes Cardio: regular rate and rhythm, S1S2 heard, no murmurs appreciated Pulm: clear to auscultation bilaterally, no wheezes, rhonchi or rales; normal work of breathing on room air Extremities: warm, well perfused, No edema, cyanosis or clubbing; +2 pulses bilaterally MSK: Normal gait and station.  No midline tenderness palpation to the spine  Assessment/ Plan: 60 y.o. male   Depression with anxiety - Plan: DULoxetine (CYMBALTA) 60 MG capsule, TSH  Benign essential HTN - Plan: losartan (COZAAR) 50 MG tablet, Lipid panel, CMP14+EGFR  Back pain with history of spinal surgery - Plan: ToxASSURE Select 13 (MW), Urine, HYDROcodone-acetaminophen (NORCO/VICODIN) 5-325 MG tablet  Controlled substance agreement signed - Plan: ToxASSURE Select 13 (MW), Urine  Hyperlipidemia with target low density lipoprotein (LDL) cholesterol less than 100 mg/dL - Plan: atorvastatin (LIPITOR) 80 MG tablet, Lipid panel, CMP14+EGFR  GAD (generalized anxiety disorder) - Plan: hydrOXYzine (ATARAX/VISTARIL) 50 MG tablet, TSH  Former heavy tobacco smoker - Plan: CT CHEST LUNG CA SCREEN LOW DOSE W/O CM, CBC  Screening for malignant neoplasm of prostate -  Plan: PSA  Anxiety and depression are stable with use of Cymbalta.  This has been renewed.  Check thyroid-stimulating hormone  Blood pressure borderline with losartan 50 .  Continue current regimen for now.  He will return for fasting lipid panel and CMP. Recommend recheck with RN  Chronic back pain status post spinal surgery.  UDS and CSC were updated as per office policy.  National narcotic database was reviewed and there were no red flags.  Use seems sparing without complications.  We discussed using this as a secondary medication for refractory pain.  Plan for fasting lipid panel as above.  He will continue Lipitor and Zetia  Low-dose CT lung cancer screen ordered given greater than 30-pack-year history.  CBC will also be collected with after mentioned labs  Asymptomatic from prostate standpoint but PSA will be collected along with his fasting labs  We discussed that ideally we should have a full physical exam to discuss ongoing issues.  He will schedule this at the appropriate interval.  He is technically due after 28 July.  No orders of the defined types were placed in this encounter.  No orders of the defined types were placed in this encounter.    Janora Norlander, DO [place] [phone]

## 2021-06-14 NOTE — Patient Instructions (Signed)
Come in for fasting labs at your convenience.  Ok to take your daily meds with water  You are due for full physical exam at the end of the month so plan to schedule that soon.  You declined vaccines today.  CT for lung cancer screening is ordered.  Congrats on stopping smoking!

## 2021-06-17 LAB — TOXASSURE SELECT 13 (MW), URINE

## 2021-06-21 ENCOUNTER — Other Ambulatory Visit: Payer: Medicare Other

## 2021-06-21 ENCOUNTER — Other Ambulatory Visit: Payer: Self-pay

## 2021-06-21 DIAGNOSIS — Z87891 Personal history of nicotine dependence: Secondary | ICD-10-CM

## 2021-06-21 DIAGNOSIS — I10 Essential (primary) hypertension: Secondary | ICD-10-CM | POA: Diagnosis not present

## 2021-06-21 DIAGNOSIS — Z125 Encounter for screening for malignant neoplasm of prostate: Secondary | ICD-10-CM

## 2021-06-21 DIAGNOSIS — F411 Generalized anxiety disorder: Secondary | ICD-10-CM

## 2021-06-21 DIAGNOSIS — E785 Hyperlipidemia, unspecified: Secondary | ICD-10-CM | POA: Diagnosis not present

## 2021-06-21 DIAGNOSIS — R739 Hyperglycemia, unspecified: Secondary | ICD-10-CM | POA: Diagnosis not present

## 2021-06-21 DIAGNOSIS — F418 Other specified anxiety disorders: Secondary | ICD-10-CM

## 2021-06-22 LAB — CBC
Hematocrit: 46.1 % (ref 37.5–51.0)
Hemoglobin: 15.5 g/dL (ref 13.0–17.7)
MCH: 29.9 pg (ref 26.6–33.0)
MCHC: 33.6 g/dL (ref 31.5–35.7)
MCV: 89 fL (ref 79–97)
Platelets: 183 10*3/uL (ref 150–450)
RBC: 5.18 x10E6/uL (ref 4.14–5.80)
RDW: 12.2 % (ref 11.6–15.4)
WBC: 8.3 10*3/uL (ref 3.4–10.8)

## 2021-06-22 LAB — CMP14+EGFR
ALT: 50 IU/L — ABNORMAL HIGH (ref 0–44)
AST: 32 IU/L (ref 0–40)
Albumin/Globulin Ratio: 1.5 (ref 1.2–2.2)
Albumin: 4.5 g/dL (ref 3.8–4.9)
Alkaline Phosphatase: 81 IU/L (ref 44–121)
BUN/Creatinine Ratio: 16 (ref 9–20)
BUN: 16 mg/dL (ref 6–24)
Bilirubin Total: 0.7 mg/dL (ref 0.0–1.2)
CO2: 23 mmol/L (ref 20–29)
Calcium: 9.6 mg/dL (ref 8.7–10.2)
Chloride: 101 mmol/L (ref 96–106)
Creatinine, Ser: 1.03 mg/dL (ref 0.76–1.27)
Globulin, Total: 3 g/dL (ref 1.5–4.5)
Glucose: 119 mg/dL — ABNORMAL HIGH (ref 65–99)
Potassium: 4.6 mmol/L (ref 3.5–5.2)
Sodium: 140 mmol/L (ref 134–144)
Total Protein: 7.5 g/dL (ref 6.0–8.5)
eGFR: 84 mL/min/{1.73_m2} (ref >59)

## 2021-06-22 LAB — LIPID PANEL
Chol/HDL Ratio: 3.2 ratio (ref 0.0–5.0)
Cholesterol, Total: 154 mg/dL (ref 100–199)
HDL: 48 mg/dL (ref >39)
LDL Chol Calc (NIH): 85 mg/dL (ref 0–99)
Triglycerides: 118 mg/dL (ref 0–149)
VLDL Cholesterol Cal: 21 mg/dL (ref 5–40)

## 2021-06-22 LAB — PSA: Prostate Specific Ag, Serum: 0.9 ng/mL (ref 0.0–4.0)

## 2021-06-22 LAB — TSH: TSH: 1.41 u[IU]/mL (ref 0.450–4.500)

## 2021-06-28 LAB — HGB A1C W/O EAG: Hgb A1c MFr Bld: 6.2 % — ABNORMAL HIGH (ref 4.8–5.6)

## 2021-06-28 LAB — SPECIMEN STATUS REPORT

## 2021-07-13 DIAGNOSIS — C678 Malignant neoplasm of overlapping sites of bladder: Secondary | ICD-10-CM | POA: Diagnosis not present

## 2021-07-13 DIAGNOSIS — R310 Gross hematuria: Secondary | ICD-10-CM | POA: Diagnosis not present

## 2021-07-27 ENCOUNTER — Encounter (HOSPITAL_COMMUNITY): Payer: Self-pay

## 2021-07-27 NOTE — Progress Notes (Signed)
Attempted to reach patient regarding LCS. Unable to reach patient at this time. Detailed VM left asking that the patient return my call. °

## 2021-08-14 ENCOUNTER — Ambulatory Visit (INDEPENDENT_AMBULATORY_CARE_PROVIDER_SITE_OTHER): Payer: Medicare Other | Admitting: Family Medicine

## 2021-08-14 ENCOUNTER — Other Ambulatory Visit: Payer: Self-pay

## 2021-08-14 ENCOUNTER — Encounter: Payer: Self-pay | Admitting: Family Medicine

## 2021-08-14 VITALS — BP 159/103 | HR 92 | Temp 98.4°F | Ht 71.0 in | Wt 220.6 lb

## 2021-08-14 DIAGNOSIS — Z23 Encounter for immunization: Secondary | ICD-10-CM | POA: Diagnosis not present

## 2021-08-14 DIAGNOSIS — R7989 Other specified abnormal findings of blood chemistry: Secondary | ICD-10-CM | POA: Diagnosis not present

## 2021-08-14 DIAGNOSIS — I10 Essential (primary) hypertension: Secondary | ICD-10-CM | POA: Diagnosis not present

## 2021-08-14 DIAGNOSIS — Z0000 Encounter for general adult medical examination without abnormal findings: Secondary | ICD-10-CM

## 2021-08-14 DIAGNOSIS — Z0001 Encounter for general adult medical examination with abnormal findings: Secondary | ICD-10-CM

## 2021-08-14 DIAGNOSIS — R7303 Prediabetes: Secondary | ICD-10-CM

## 2021-08-14 LAB — BAYER DCA HB A1C WAIVED: HB A1C (BAYER DCA - WAIVED): 6.1 % — ABNORMAL HIGH (ref 4.8–5.6)

## 2021-08-14 MED ORDER — LOSARTAN POTASSIUM 100 MG PO TABS: 100.0000 mg | ORAL_TABLET | Freq: Every day | ORAL | 3 refills | Status: DC

## 2021-08-14 NOTE — Progress Notes (Signed)
[name] is a 60 y.o. male presents to office today for annual physical exam examination.    Concerns today include: 1.  Elevated liver function test Patient denies alcohol use, Tylenol use.  He sometimes uses Excedrin.  No recent illness.  Compliant with Lipitor.  Working on weight loss.  Marital status: has significant other, Substance use: none Diet: Not carb restricted, Exercise: Tries to stay active Last eye exam: Up-to-date Last dental exam: Up-to-date Last colonoscopy: Up-to-date on colonoscopy until 2027 Refills needed today: None Immunizations needed: Shingles vaccination and influenza vaccination Immunization History  Administered Date(s) Administered   Influenza Inj Mdck Quad Pf 10/15/2018   Influenza,inj,Quad PF,6+ Mos 09/07/2015, 09/05/2016, 10/02/2017, 10/14/2019   PFIZER(Purple Top)SARS-COV-2 Vaccination 05/05/2020, 05/26/2020   Tdap 06/07/2010     Past Medical History:  Diagnosis Date   Acute pancreatitis    Anxiety    Atrial flutter ([place]) 09/30/2013   Benign essential hypertension    per pt denies ,  but is documented in epic by his pcp   Bladder cancer [facility]) urologist-- dr bell   dx 08/ 2019  s/p  TURBT   Bladder tumor    CAD (coronary artery disease)    last cardiac cath 08-12-2014  mild non-obstructive CAD ,  normal LVF   DDD (degenerative disc disease), lumbar    Depression    Diabetes mellitus type 2, diet-controlled ([place])    Fatty liver    First degree heart block    Headache(784.0)    History of acute pancreatitis 02/2015   History of kidney stones    History of syncope    Hyperlipidemia    Intermediate coronary syndrome (HCC)    Intermittent palpitations    takes bystolic   Obesity    Pancreatic divisum 2016   Social History   Socioeconomic History   Marital status: Married    Spouse name: Not on file   Number of children: 2   Years of education: Not on file   Highest education level: Not on file  Occupational History   Not  on file  Tobacco Use   Smoking status: Every Day    Packs/day: 1.50    Years: 20.00    Pack years: 30.00    Types: Cigarettes    Last attempt to quit: 11/26/2017    Years since quitting: 3.7   Smokeless tobacco: Former    Types: Snuff    Quit date: 07/27/2008  Vaping Use   Vaping Use: Never used  Substance and Sexual Activity   Alcohol use: Yes    Alcohol/week: 0.0 standard drinks    Comment: socially   Drug use: No   Sexual activity: Never  Other Topics Concern   Not on file  Social History Narrative   ** Merged History Encounter **       Social Determinants of Health   Financial Resource Strain: Not on file  Food Insecurity: Not on file  Transportation Needs: Not on file  Physical Activity: Not on file  Stress: Not on file  Social Connections: Not on file  Intimate Partner Violence: Not on file   Past Surgical History:  Procedure Laterality Date   CARDIAC CATHETERIZATION  10-08-2002;  03-15-2006   mild non-obstructive CAD,  normal LVF   ENTEROSCOPY N/A 03/20/2015   Procedure: ENTEROSCOPY;  Surgeon: Jerene Bears, MD;  Location: [facility] ENDOSCOPY;  Service: Endoscopy;  Laterality: N/A;   ERCP  04/2015   stone in pancreatic duct   LEFT HEART  CATHETERIZATION WITH CORONARY ANGIOGRAM N/A 08/12/2014   Procedure: LEFT HEART CATHETERIZATION WITH CORONARY ANGIOGRAM;  Surgeon: Burnell Blanks, MD;  Location: [facility] CATH LAB;  Service: Cardiovascular;  Laterality: N/A;   POSTERIOR LUMBAR FUSION  2010   L3 -- 5   TONSILLECTOMY  1979  age 10   TRANSURETHRAL RESECTION OF BLADDER TUMOR WITH MITOMYCIN-C N/A 07/23/2018   Procedure: TRANSURETHRAL RESECTION OF BLADDER TUMOR WITH GEMCITABINE;  Surgeon: Lucas Mallow, MD;  Location: [facility] ORS;  Service: Urology;  Laterality: N/A;   TRANSURETHRAL RESECTION OF BLADDER TUMOR WITH MITOMYCIN-C N/A 12/19/2018   Procedure: TRANSURETHRAL RESECTION OF BLADDER TUMOR WITH GEMCITABINE;  Surgeon: Lucas Mallow, MD;  Location: [facility];  Service: Urology;  Laterality: N/A;   Family History  Problem Relation Age of Onset   Heart attack Father        father died of heart attach in his 68s   Hyperlipidemia Father    Early death Father    Lung cancer Mother    Hyperlipidemia Mother    Hyperlipidemia Brother    Alzheimer's disease Maternal Grandmother    Colon cancer Neg Hx    Colon polyps Neg Hx    Esophageal cancer Neg Hx    Rectal cancer Neg Hx    Stomach cancer Neg Hx     Current Outpatient Medications:    aspirin 325 MG tablet, Take 325 mg by mouth daily., Disp: , Rfl:    atorvastatin (LIPITOR) 80 MG tablet, Take 1 tablet (80 mg total) by mouth every morning., Disp: 90 tablet, Rfl: 3   DULoxetine (CYMBALTA) 60 MG capsule, Take 1 capsule (60 mg total) by mouth daily., Disp: 90 capsule, Rfl: 3   ezetimibe (ZETIA) 10 MG tablet, Take 1 tablet (10 mg total) by mouth daily., Disp: 90 tablet, Rfl: 3   HYDROcodone-acetaminophen (NORCO/VICODIN) 5-325 MG tablet, Take 1 tablet by mouth every 8 (eight) hours as needed for severe pain., Disp: 90 tablet, Rfl: 0   hydrOXYzine (ATARAX/VISTARIL) 50 MG tablet, Take 0.5-2 tablets (25-100 mg total) by mouth every 8 (eight) hours as needed for anxiety (panic attack)., Disp: 30 tablet, Rfl: 5   losartan (COZAAR) 50 MG tablet, Take 1 tablet (50 mg total) by mouth daily., Disp: 90 tablet, Rfl: 3   Misc. Devices MISC, Initiate cpap at 15 cm of water pressure new machine and supplies DME company [employer], Disp: , Rfl:   No Known Allergies   ROS: Review of Systems Pertinent items noted in HPI and remainder of comprehensive ROS otherwise negative.    Physical exam BP (!) 159/103   Pulse 92   Temp 98.4 F (36.9 C)   Ht '5\' 11"'$  (1.803 m)   Wt 220 lb 9.6 oz (100.1 kg)   SpO2 96%   BMI 30.77 kg/m  General appearance: alert, cooperative, appears stated age, and no distress Head: Normocephalic, without obvious abnormality, atraumatic Eyes: negative findings: lids and lashes  normal, conjunctivae and sclerae normal, corneas clear, and pupils equal, round, reactive to light and accomodation Ears: normal TM's and external ear canals both ears Nose: Nares normal. Septum midline. Mucosa normal. No drainage or sinus tenderness. Throat: lips, mucosa, and tongue normal; teeth and gums normal Neck: no adenopathy, no carotid bruit, supple, symmetrical, trachea midline, and thyroid not enlarged, symmetric, no tenderness/mass/nodules Back: symmetric, no curvature. ROM normal. No CVA tenderness. Lungs: clear to auscultation bilaterally Chest wall: no tenderness Heart: regular rate and rhythm, S1, S2 normal, no murmur,  click, rub or gallop Abdomen: soft, non-tender; bowel sounds normal; no masses,  no organomegaly Extremities: extremities normal, atraumatic, no cyanosis or edema Pulses: 2+ and symmetric Skin: Skin color, texture, turgor normal. No rashes or lesions Lymph nodes: Cervical, supraclavicular, and axillary nodes normal. Neurologic: Grossly normal Psych: Mood stable, speech normal  Depression screen [facility] 2/9 06/14/2021 06/20/2020 11/17/2019  Decreased Interest 0 0 1  Down, Depressed, Hopeless 0 3 1  PHQ - 2 Score 0 3 2  Altered sleeping - 0 1  Tired, decreased energy - 0 2  Change in appetite - 0 0  Feeling bad or failure about yourself  - 0 2  Trouble concentrating - 0 2  Moving slowly or fidgety/restless - 0 1  Suicidal thoughts - 0 0  PHQ-9 Score - 3 10  Difficult doing work/chores - Not difficult at all Somewhat difficult   GAD 7 : Generalized Anxiety Score 06/14/2021 06/20/2020  Nervous, Anxious, on Edge 1 1  Control/stop worrying 0 0  Worry too much - different things 1 0  Trouble relaxing 0 0  Restless 0 0  Easily annoyed or irritable 0 0  Afraid - awful might happen 0 0  Total GAD 7 Score 2 1  Anxiety Difficulty Not difficult at all Not difficult at all    Assessment/ Plan: [name] here for annual physical exam.   Annual physical  exam  Pre-diabetes - Plan: Bayer DCA Hb A1c Waived  Elevated liver function tests - Plan: Hepatic Function Panel  Need for immunization against influenza  Benign essential HTN - Plan: losartan (COZAAR) 100 MG tablet, Basic metabolic panel  Up-to-date on preventative health care.  He will have CT scheduled for lung cancer screening  Recheck A1c given prediabetes.  I counseled him on sugar reduction today  No obvious etiology of mild elevation liver function test.  Possibly transient.  Recheck hepatic function panel  Influenza vaccination administered  Blood pressure not at goal despite recheck and compliance with medications.  I advanced his losartan to 100 mg daily.  He will return in 1 week for blood pressure check with nurse as well as repeat BMP  Counseled on healthy lifestyle choices, including diet (rich in fruits, vegetables and lean meats and low in salt and simple carbohydrates) and exercise (at least 30 minutes of moderate physical activity daily).  Patient to follow up in 1 year for annual exam or sooner if needed.  Frutoso Dimare M. Lajuana Ripple, DO

## 2021-08-14 NOTE — Patient Instructions (Signed)
You had labs performed today.  You will be contacted with the results of the labs once they are available, usually in the next 3 business days for routine lab work.  If you have an active my chart account, they will be released to your MyChart.  If you prefer to have these labs released to you via   You got your flu shot today  Don't forget to reschedule the CT for lung cancer screening.  Preventive Care 38-59 Years Old, Male Preventive care refers to lifestyle choices and visits with your health care provider that can promote health and wellness. This includes: A yearly physical exam. This is also called an annual wellness visit. Regular dental and eye exams. Immunizations. Screening for certain conditions. Healthy lifestyle choices, such as: Eating a healthy diet. Getting regular exercise. Not using drugs or products that contain nicotine and tobacco. Limiting alcohol use. What can I expect for my preventive care visit? Physical exam Your health care provider will check your: Height and weight. These may be used to calculate your BMI (body mass index). BMI is a measurement that tells if you are at a healthy weight. Heart rate and blood pressure. Body temperature. Skin for abnormal spots. Counseling Your health care provider may ask you questions about your: Past medical problems. Family's medical history. Alcohol, tobacco, and drug use. Emotional well-being. Home life and relationship well-being. Sexual activity. Diet, exercise, and sleep habits. Work and work Statistician. Access to firearms. What immunizations do I need? Vaccines are usually given at various ages, according to a schedule. Your health care provider will recommend vaccines for you based on your age, medical history, and lifestyle or other factors, such as travel or where you work. What tests do I need? Blood tests Lipid and cholesterol levels. These may be checked every 5 years, or more often if you are over  7 years old. Hepatitis C test. Hepatitis B test. Screening Lung cancer screening. You may have this screening every year starting at age 44 if you have a 30-pack-year history of smoking and currently smoke or have quit within the past 15 years. Prostate cancer screening. Recommendations will vary depending on your family history and other risks. Genital exam to check for testicular cancer or hernias. Colorectal cancer screening. All adults should have this screening starting at age 70 and continuing until age 9. Your health care provider may recommend screening at age 22 if you are at increased risk. You will have tests every 1-10 years, depending on your results and the type of screening test. Diabetes screening. This is done by checking your blood sugar (glucose) after you have not eaten for a while (fasting). You may have this done every 1-3 years. STD (sexually transmitted disease) testing, if you are at risk. Follow these instructions at home: Eating and drinking  Eat a diet that includes fresh fruits and vegetables, whole grains, lean protein, and low-fat dairy products. Take vitamin and mineral supplements as recommended by your health care provider. Do not drink alcohol if your health care provider tells you not to drink. If you drink alcohol: Limit how much you have to 0-2 drinks a day. Be aware of how much alcohol is in your drink. In the [place] one drink equals one 12 oz bottle of beer (355 mL), one 5 oz glass of wine (148 mL), or one 1 oz glass of hard liquor (44 mL). Lifestyle Take daily care of your teeth and gums. Brush your teeth every morning and night  with fluoride toothpaste. Floss one time each day. Stay active. Exercise for at least 30 minutes 5 or more days each week. Do not use any products that contain nicotine or tobacco, such as cigarettes, e-cigarettes, and chewing tobacco. If you need help quitting, ask your health care provider. Do not use drugs. If you are  sexually active, practice safe sex. Use a condom or other form of protection to prevent STIs (sexually transmitted infections). If told by your health care provider, take low-dose aspirin daily starting at age 77. Find healthy ways to cope with stress, such as: Meditation, yoga, or listening to music. Journaling. Talking to a trusted person. Spending time with friends and family. Safety Always wear your seat belt while driving or riding in a vehicle. Do not drive: If you have been drinking alcohol. Do not ride with someone who has been drinking. When you are tired or distracted. While texting. Wear a helmet and other protective equipment during sports activities. If you have firearms in your house, make sure you follow all gun safety procedures. What's next? Go to your health care provider once a year for an annual wellness visit. Ask your health care provider how often you should have your eyes and teeth checked. Stay up to date on all vaccines. This information is not intended to replace advice given to you by your health care provider. Make sure you discuss any questions you have with your health care provider. Document Revised: 01/20/2021 Document Reviewed: 11/06/2018 Elsevier Patient Education  2022 [employer].

## 2021-08-15 LAB — HEPATIC FUNCTION PANEL
ALT: 45 IU/L — ABNORMAL HIGH (ref 0–44)
AST: 30 IU/L (ref 0–40)
Albumin: 4.7 g/dL (ref 3.8–4.9)
Alkaline Phosphatase: 85 IU/L (ref 44–121)
Bilirubin Total: 0.4 mg/dL (ref 0.0–1.2)
Bilirubin, Direct: 0.13 mg/dL (ref 0.00–0.40)
Total Protein: 7.5 g/dL (ref 6.0–8.5)

## 2021-08-21 ENCOUNTER — Other Ambulatory Visit: Payer: Self-pay

## 2021-08-21 ENCOUNTER — Ambulatory Visit: Payer: Medicare Other | Admitting: *Deleted

## 2021-08-21 DIAGNOSIS — I10 Essential (primary) hypertension: Secondary | ICD-10-CM | POA: Diagnosis not present

## 2021-08-22 LAB — BASIC METABOLIC PANEL
BUN/Creatinine Ratio: 14 (ref 9–20)
BUN: 14 mg/dL (ref 6–24)
CO2: 23 mmol/L (ref 20–29)
Calcium: 9.8 mg/dL (ref 8.7–10.2)
Chloride: 103 mmol/L (ref 96–106)
Creatinine, Ser: 0.98 mg/dL (ref 0.76–1.27)
Glucose: 93 mg/dL (ref 70–99)
Potassium: 4.2 mmol/L (ref 3.5–5.2)
Sodium: 144 mmol/L (ref 134–144)
eGFR: 89 mL/min/{1.73_m2} (ref >59)

## 2021-10-13 ENCOUNTER — Ambulatory Visit: Payer: Medicare Other | Admitting: Family Medicine

## 2021-10-13 DIAGNOSIS — R3982 Chronic bladder pain: Secondary | ICD-10-CM | POA: Diagnosis not present

## 2021-10-13 DIAGNOSIS — R310 Gross hematuria: Secondary | ICD-10-CM | POA: Diagnosis not present

## 2021-10-16 ENCOUNTER — Ambulatory Visit: Payer: Medicare Other | Admitting: Family Medicine

## 2021-11-15 ENCOUNTER — Other Ambulatory Visit: Payer: Self-pay | Admitting: Urology

## 2021-11-15 ENCOUNTER — Telehealth: Payer: Self-pay | Admitting: Family Medicine

## 2021-11-15 NOTE — Telephone Encounter (Signed)
°  Left message for patient to call back and schedule Medicare Annual Wellness Visit (AWV) to be completed by video or phone.  No hx of AWV eligible for AWVI as of 06/26/2017 per [facility]   Please schedule at anytime with [facility] --- Karle Starch  45 Minutes appointment   Any questions, please call me at [phone]

## 2021-11-24 ENCOUNTER — Telehealth: Payer: Medicare Other | Admitting: Physician Assistant

## 2021-11-24 DIAGNOSIS — U071 COVID-19: Secondary | ICD-10-CM | POA: Diagnosis not present

## 2021-11-24 MED ORDER — MOLNUPIRAVIR EUA 200MG CAPSULE: 4.0000 | ORAL_CAPSULE | Freq: Two times a day (BID) | ORAL | 0 refills | Status: AC

## 2021-11-24 NOTE — Patient Instructions (Signed)
[name], thank you for joining Leeanne Rio, PA-C for today's virtual visit.  While this provider is not your primary care provider (PCP), if your PCP is located in our provider database this encounter information will be shared with them immediately following your visit.  Consent: (Patient) [name] provided verbal consent for this virtual visit at the beginning of the encounter.  Current Medications:  Current Outpatient Medications:    aspirin 325 MG tablet, Take 325 mg by mouth daily., Disp: , Rfl:    atorvastatin (LIPITOR) 80 MG tablet, Take 1 tablet (80 mg total) by mouth every morning., Disp: 90 tablet, Rfl: 3   DULoxetine (CYMBALTA) 60 MG capsule, Take 1 capsule (60 mg total) by mouth daily., Disp: 90 capsule, Rfl: 3   ezetimibe (ZETIA) 10 MG tablet, Take 1 tablet (10 mg total) by mouth daily., Disp: 90 tablet, Rfl: 3   HYDROcodone-acetaminophen (NORCO/VICODIN) 5-325 MG tablet, Take 1 tablet by mouth every 8 (eight) hours as needed for severe pain., Disp: 90 tablet, Rfl: 0   hydrOXYzine (ATARAX/VISTARIL) 50 MG tablet, Take 0.5-2 tablets (25-100 mg total) by mouth every 8 (eight) hours as needed for anxiety (panic attack)., Disp: 30 tablet, Rfl: 5   losartan (COZAAR) 100 MG tablet, Take 1 tablet (100 mg total) by mouth daily. For high blood pressure., Disp: 90 tablet, Rfl: 3   tamsulosin (FLOMAX) 0.4 MG CAPS capsule, TAKE ONE CAPSULE BY MOUTH AT BEDTIME, Disp: 30 capsule, Rfl: 0   Medications ordered in this encounter:  No orders of the defined types were placed in this encounter.    *If you need refills on other medications prior to your next appointment, please contact your pharmacy*  Follow-Up: Call back or seek an in-person evaluation if the symptoms worsen or if the condition fails to improve as anticipated.  Other Instructions Please keep well-hydrated and get plenty of rest. Start a saline nasal rinse to flush out your nasal passages. You can use plain  Mucinex to help thin congestion. If you have a humidifier, running in the bedroom at night. I want you to start OTC vitamin D3 1000 units daily, vitamin C 1000 mg daily, and a zinc supplement. Please take prescribed medications as directed.  You have been enrolled in a MyChart symptom monitoring program. Please answer these questions daily so we can keep track of how you are doing.  You were to quarantine for 5 days from onset of your symptoms.  After day 5, if you have had no fever and you are feeling better, you can end quarantine but need to mask for an additional 5 days. After day 5 if you have a fever or are having significant symptoms, please quarantine for full 10 days.  If you note any worsening of symptoms, any significant shortness of breath or any chest pain, please seek ER evaluation ASAP.  Please do not delay care!  COVID-19: What to Do if You Are Sick If you test positive and are an older adult or someone who is at high risk of getting very sick from COVID-19, treatment may be available. Contact a healthcare provider right away after a positive test to determine if you are eligible, even if your symptoms are mild right now. You can also visit a Test to Treat location and, if eligible, receive a prescription from a provider. Don't delay: Treatment must be started within the first few days to be effective. If you have a fever, cough, or other symptoms, you might  have COVID-19. Most people have mild illness and are able to recover at home. If you are sick: Keep track of your symptoms. If you have an emergency warning sign (including trouble breathing), call 911. Steps to help prevent the spread of COVID-19 if you are sick If you are sick with COVID-19 or think you might have COVID-19, follow the steps below to care for yourself and to help protect other people in your home and community. Stay home except to get medical care Stay home. Most people with COVID-19 have mild illness and  can recover at home without medical care. Do not leave your home, except to get medical care. Do not visit public areas and do not go to places where you are unable to wear a mask. Take care of yourself. Get rest and stay hydrated. Take over-the-counter medicines, such as acetaminophen, to help you feel better. Stay in touch with your doctor. Call before you get medical care. Be sure to get care if you have trouble breathing, or have any other emergency warning signs, or if you think it is an emergency. Avoid public transportation, ride-sharing, or taxis if possible. Get tested If you have symptoms of COVID-19, get tested. While waiting for test results, stay away from others, including staying apart from those living in your household. Get tested as soon as possible after your symptoms start. Treatments may be available for people with COVID-19 who are at risk for becoming very sick. Don't delay: Treatment must be started early to be effective--some treatments must begin within 5 days of your first symptoms. Contact your healthcare provider right away if your test result is positive to determine if you are eligible. Self-tests are one of several options for testing for the virus that causes COVID-19 and may be more convenient than laboratory-based tests and point-of-care tests. Ask your healthcare provider or your local health department if you need help interpreting your test results. You can visit your state, tribal, local, and territorial health department's website to look for the latest local information on testing sites. Separate yourself from other people As much as possible, stay in a specific room and away from other people and pets in your home. If possible, you should use a separate bathroom. If you need to be around other people or animals in or outside of the home, wear a well-fitting mask. Tell your close contacts that they may have been exposed to COVID-19. An infected person can spread  COVID-19 starting 48 hours (or 2 days) before the person has any symptoms or tests positive. By letting your close contacts know they may have been exposed to COVID-19, you are helping to protect everyone. See COVID-19 and Animals if you have questions about pets. If you are diagnosed with COVID-19, someone from the health department may call you. Answer the call to slow the spread. Monitor your symptoms Symptoms of COVID-19 include fever, cough, or other symptoms. Follow care instructions from your healthcare provider and local health department. Your local health authorities may give instructions on checking your symptoms and reporting information. When to seek emergency medical attention Look for emergency warning signs* for COVID-19. If someone is showing any of these signs, seek emergency medical care immediately: Trouble breathing Persistent pain or pressure in the chest New confusion Inability to wake or stay awake Pale, gray, or blue-colored skin, lips, or nail beds, depending on skin tone *This list is not all possible symptoms. Please call your medical provider for any other symptoms that are  severe or concerning to you. Call 911 or call ahead to your local emergency facility: Notify the operator that you are seeking care for someone who has or may have COVID-19. Call ahead before visiting your doctor Call ahead. Many medical visits for routine care are being postponed or done by phone or telemedicine. If you have a medical appointment that cannot be postponed, call your doctor's office, and tell them you have or may have COVID-19. This will help the office protect themselves and other patients. If you are sick, wear a well-fitting mask You should wear a mask if you must be around other people or animals, including pets (even at home). Wear a mask with the best fit, protection, and comfort for you. You don't need to wear the mask if you are alone. If you can't put on a mask (because of  trouble breathing, for example), cover your coughs and sneezes in some other way. Try to stay at least 6 feet away from other people. This will help protect the people around you. Masks should not be placed on young children under age 62 years, anyone who has trouble breathing, or anyone who is not able to remove the mask without help. Cover your coughs and sneezes Cover your mouth and nose with a tissue when you cough or sneeze. Throw away used tissues in a lined trash can. Immediately wash your hands with soap and water for at least 20 seconds. If soap and water are not available, clean your hands with an alcohol-based hand sanitizer that contains at least 60% alcohol. Clean your hands often Wash your hands often with soap and water for at least 20 seconds. This is especially important after blowing your nose, coughing, or sneezing; going to the bathroom; and before eating or preparing food. Use hand sanitizer if soap and water are not available. Use an alcohol-based hand sanitizer with at least 60% alcohol, covering all surfaces of your hands and rubbing them together until they feel dry. Soap and water are the best option, especially if hands are visibly dirty. Avoid touching your eyes, nose, and mouth with unwashed hands. Handwashing Tips Avoid sharing personal household items Do not share dishes, drinking glasses, cups, eating utensils, towels, or bedding with other people in your home. Wash these items thoroughly after using them with soap and water or put in the dishwasher. Clean surfaces in your home regularly Clean and disinfect high-touch surfaces (for example, doorknobs, tables, handles, light switches, and countertops) in your "sick room" and bathroom. In shared spaces, you should clean and disinfect surfaces and items after each use by the person who is ill. If you are sick and cannot clean, a caregiver or other person should only clean and disinfect the area around you (such as your  bedroom and bathroom) on an as needed basis. Your caregiver/other person should wait as long as possible (at least several hours) and wear a mask before entering, cleaning, and disinfecting shared spaces that you use. Clean and disinfect areas that may have blood, stool, or body fluids on them. Use household cleaners and disinfectants. Clean visible dirty surfaces with household cleaners containing soap or detergent. Then, use a household disinfectant. Use a product from [employer]: Disinfectants for Coronavirus (VQQVZ-56). Be sure to follow the instructions on the label to ensure safe and effective use of the product. Many products recommend keeping the surface wet with a disinfectant for a certain period of time (look at "contact time" on the product label). You  may also need to wear personal protective equipment, such as gloves, depending on the directions on the product label. Immediately after disinfecting, wash your hands with soap and water for 20 seconds. For completed guidance on cleaning and disinfecting your home, visit Complete Disinfection Guidance. Take steps to improve ventilation at home Improve ventilation (air flow) at home to help prevent from spreading COVID-19 to other people in your household. Clear out COVID-19 virus particles in the air by opening windows, using air filters, and turning on fans in your home. Use this interactive tool to learn how to improve air flow in your home. When you can be around others after being sick with COVID-19 Deciding when you can be around others is different for different situations. Find out when you can safely end home isolation. For any additional questions about your care, contact your healthcare provider or state or local health department. 02/14/2021 Content source: [facility] ([facility]), Division of Viral Diseases This information is not intended to replace advice given to you by your health  care provider. Make sure you discuss any questions you have with your health care provider. Document Revised: 03/30/2021 Document Reviewed: 03/30/2021 Elsevier Patient Education  2022 [employer].      If you have been instructed to have an in-person evaluation today at a local Urgent Care facility, please use the link below. It will take you to a list of all of our available [facility], including address, phone number and hours of operation. Please do not delay care.  [facility]  If you or a family member do not have a primary care provider, use the link below to schedule a visit and establish care. When you choose a [facility] primary care physician or advanced practice provider, you gain a long-term partner in health. Find a Primary Care Provider  Learn more about [facility] in-office and virtual care options: [facility]

## 2021-11-24 NOTE — Progress Notes (Signed)
Virtual Visit Consent   [name], you are scheduled for a virtual visit with a [facility] provider today.     Just as with appointments in the office, your consent must be obtained to participate.  Your consent will be active for this visit and any virtual visit you may have with one of our providers in the next 365 days.     If you have a MyChart account, a copy of this consent can be sent to you electronically.  All virtual visits are billed to your insurance company just like a traditional visit in the office.    As this is a virtual visit, video technology does not allow for your provider to perform a traditional examination.  This may limit your provider's ability to fully assess your condition.  If your provider identifies any concerns that need to be evaluated in person or the need to arrange testing (such as labs, EKG, etc.), we will make arrangements to do so.     Although advances in technology are sophisticated, we cannot ensure that it will always work on either your end or our end.  If the connection with a video visit is poor, the visit may have to be switched to a telephone visit.  With either a video or telephone visit, we are not always able to ensure that we have a secure connection.     I need to obtain your verbal consent now.   Are you willing to proceed with your visit today?    [name] has provided verbal consent on 11/24/2021 for a virtual visit (video or telephone).   Leeanne Rio, [place]   Date: 11/24/2021 3:37 PM   Virtual Visit via Video Note   I, Leeanne Rio, connected with  [name]  ([id number], 1961-07-31) on 11/24/21 at  3:30 PM EST by a video-enabled telemedicine application and verified that I am speaking with the correct person using two identifiers.  Location: Patient: Virtual Visit Location Patient: Home Provider: Virtual Visit Location Provider: Home Office   I discussed the limitations of evaluation and management by  telemedicine and the availability of in person appointments. The patient expressed understanding and agreed to proceed.    History of Present Illness: [name] is a 60 y.o. who identifies as a male who was assigned male at birth, and is being seen today for COVID-19.  Notes symptoms starting yesterday with fever, fatigue, congestion and scratchy throat. Symptoms worsened throughout the day. As such he took a home COVID test last night which was positive.  He has been taking Aleve for fever and aches. Denies GI symptoms. Is trying to eat and hydrate well. Denies chest pain or SOB.  Denies dizziness but did note some lightheadedness yesterday.    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   Abnormal CT of the abdomen    Intermediate coronary syndrome (HCC)    Obesity    Anxiety    GAD (generalized anxiety disorder) 09/30/2013   Hyperlipidemia with target low  density lipoprotein (LDL) cholesterol less than 100 mg/dL 09/30/2013    Allergies: No Known Allergies Medications:  Current Outpatient Medications:    molnupiravir EUA (LAGEVRIO) 200 mg CAPS capsule, Take 4 capsules (800 mg total) by mouth 2 (two) times daily for 5 days., Disp: 40 capsule, Rfl: 0   aspirin 325 MG tablet, Take 325 mg by mouth daily., Disp: , Rfl:    atorvastatin (LIPITOR) 80 MG tablet, Take 1 tablet (80 mg total) by mouth every morning., Disp: 90 tablet, Rfl: 3   DULoxetine (CYMBALTA) 60 MG capsule, Take 1 capsule (60 mg total) by mouth daily., Disp: 90 capsule, Rfl:  3   ezetimibe (ZETIA) 10 MG tablet, Take 1 tablet (10 mg total) by mouth daily., Disp: 90 tablet, Rfl: 3   HYDROcodone-acetaminophen (NORCO/VICODIN) 5-325 MG tablet, Take 1 tablet by mouth every 8 (eight) hours as needed for severe pain., Disp: 90 tablet, Rfl: 0   hydrOXYzine (ATARAX/VISTARIL) 50 MG tablet, Take 0.5-2 tablets (25-100 mg total) by mouth every 8 (eight) hours as needed for anxiety (panic attack)., Disp: 30 tablet, Rfl: 5   losartan (COZAAR) 100 MG tablet, Take 1 tablet (100 mg total) by mouth daily. For high blood pressure., Disp: 90 tablet, Rfl: 3   tamsulosin (FLOMAX) 0.4 MG CAPS capsule, TAKE ONE CAPSULE BY MOUTH AT BEDTIME, Disp: 30 capsule, Rfl: 0  Observations/Objective: Patient is well-developed, well-nourished in no acute distress.  Resting comfortably at home.  Head is normocephalic, atraumatic.  No labored breathing. Speech is clear and coherent with logical content.  Patient is alert and oriented at baseline.   Assessment and Plan: 1. COVID-19 - MyChart COVID-19 home monitoring program; Future - molnupiravir EUA (LAGEVRIO) 200 mg CAPS capsule; Take 4 capsules (800 mg total) by mouth 2 (two) times daily for 5 days.  Dispense: 40 capsule; Refill: 0  Patient with multiple risk factors for complicated course of illness. Discussed risks/benefits of antiviral medications including most common potential ADRs. Patient voiced understanding and would like to proceed with antiviral medication. They are candidate for molnupiravir. Rx sent to pharmacy. Supportive measures, OTC medications and vitamin regimen reviewed. Patient has been enrolled in a MyChart COVID symptom monitoring program. Samule Dry reviewed in detail. Strict ER precautions discussed with patient.    Follow Up Instructions: I discussed the assessment and treatment plan with the patient. The patient was provided an opportunity to ask questions and all were answered. The patient agreed with the plan and  demonstrated an understanding of the instructions.  A copy of instructions were sent to the patient via MyChart unless otherwise noted below.   The patient was advised to call back or seek an in-person evaluation if the symptoms worsen or if the condition fails to improve as anticipated.  Time:  I spent 10 minutes with the patient via telehealth technology discussing the above problems/concerns.    Leeanne Rio, PA-C

## 2021-12-04 ENCOUNTER — Encounter (HOSPITAL_COMMUNITY): Payer: Self-pay

## 2021-12-04 NOTE — Progress Notes (Signed)
Attempted to reach patient regarding LCS. Unable to reach patient, detailed VM left asking that the patient return my call. °

## 2021-12-12 ENCOUNTER — Telehealth: Payer: Self-pay | Admitting: Family Medicine

## 2021-12-12 NOTE — Telephone Encounter (Signed)
°  Left message for patient to call back and schedule Medicare Annual Wellness Visit (AWV) to be completed by video or phone.  No hx of AWV eligible for AWVI as of 06/26/2017 per [facility]   Please schedule at anytime with [facility] --- Karle Starch  45 Minutes appointment   Any questions, please call me at [phone]

## 2021-12-18 ENCOUNTER — Encounter (HOSPITAL_COMMUNITY): Payer: Self-pay

## 2021-12-18 NOTE — Progress Notes (Signed)
Attempted to reach patient regarding LCS. Unable to reach patient despite multiple attempts, detailed VM left asking that the patient return my call. Referral closed at this time due to multiple unsuccessful attempts to reach patient.

## 2022-01-02 ENCOUNTER — Telehealth: Payer: Self-pay | Admitting: Family Medicine

## 2022-01-02 NOTE — Telephone Encounter (Signed)
°  Left message for patient to call back and schedule Medicare Annual Wellness Visit (AWV) to be completed by video or phone.  No hx of AWV eligible for AWVI as of 06/26/2017 per [facility]   Please schedule at anytime with [facility] --- Karle Starch  45 Minutes appointment   Any questions, please call me at [phone]

## 2022-01-04 DIAGNOSIS — K3189 Other diseases of stomach and duodenum: Secondary | ICD-10-CM | POA: Diagnosis not present

## 2022-01-04 DIAGNOSIS — C678 Malignant neoplasm of overlapping sites of bladder: Secondary | ICD-10-CM | POA: Diagnosis not present

## 2022-01-04 DIAGNOSIS — K5730 Diverticulosis of large intestine without perforation or abscess without bleeding: Secondary | ICD-10-CM | POA: Diagnosis not present

## 2022-01-04 DIAGNOSIS — C679 Malignant neoplasm of bladder, unspecified: Secondary | ICD-10-CM | POA: Diagnosis not present

## 2022-01-11 DIAGNOSIS — R310 Gross hematuria: Secondary | ICD-10-CM | POA: Diagnosis not present

## 2022-01-11 DIAGNOSIS — R3121 Asymptomatic microscopic hematuria: Secondary | ICD-10-CM | POA: Diagnosis not present

## 2022-01-11 DIAGNOSIS — C678 Malignant neoplasm of overlapping sites of bladder: Secondary | ICD-10-CM | POA: Diagnosis not present

## 2022-01-29 ENCOUNTER — Telehealth: Payer: Self-pay | Admitting: Family Medicine

## 2022-01-29 NOTE — Telephone Encounter (Signed)
?  Left message for patient to call back and schedule Medicare Annual Wellness Visit (AWV) to be completed by video or phone. ? ?No hx of AWV eligible for AWVI per [facility] as of  06/26/2017 ? ?Please schedule at anytime with [facility] --- Karle Starch ? ?24 Minutes appointment  ? ?Any questions, please call me at [phone]   ?

## 2022-01-31 ENCOUNTER — Ambulatory Visit (INDEPENDENT_AMBULATORY_CARE_PROVIDER_SITE_OTHER): Payer: Medicare Other

## 2022-01-31 VITALS — Ht 71.0 in | Wt 220.0 lb

## 2022-01-31 DIAGNOSIS — Z87891 Personal history of nicotine dependence: Secondary | ICD-10-CM | POA: Diagnosis not present

## 2022-01-31 DIAGNOSIS — Z0000 Encounter for general adult medical examination without abnormal findings: Secondary | ICD-10-CM

## 2022-01-31 NOTE — Patient Instructions (Signed)
[name] , Thank you for taking time to come for your Medicare Wellness Visit. I appreciate your ongoing commitment to your health goals. Please review the following plan we discussed and let me know if I can assist you in the future.   Screening recommendations/referrals: Colonoscopy: Done 05/22/2016 Repeat in 10 years  Recommended yearly ophthalmology/optometry visit for glaucoma screening and checkup Recommended yearly dental visit for hygiene and checkup  Vaccinations: Influenza vaccine: Done 08/14/2021 Repeat annually  Pneumococcal vaccine: Due after age 69. Tdap vaccine: Done 06/07/2010 Repeat in 10 years  Shingles vaccine: Discussed.   Covid-19: Done 05/05/2020 and 05/26/2020  Advanced directives: Advance directive discussed with you today. Even though you declined this today, please call our office should you change your mind, and we can give you the proper paperwork for you to fill out. Paperwork available at [facility]  Conditions/risks identified: 2024  Next appointment: Follow up in one year for your annual wellness visit 2024.  Preventive Care 40-64 Years, Male Preventive care refers to lifestyle choices and visits with your health care provider that can promote health and wellness. What does preventive care include? A yearly physical exam. This is also called an annual well check. Dental exams once or twice a year. Routine eye exams. Ask your health care provider how often you should have your eyes checked. Personal lifestyle choices, including: Daily care of your teeth and gums. Regular physical activity. Eating a healthy diet. Avoiding tobacco and drug use. Limiting alcohol use. Practicing safe sex. Taking low-dose aspirin every day starting at age 54. What happens during an annual well check? The services and screenings done by your health care provider during your annual well check will depend on your age, overall health, lifestyle risk factors, and family history of  disease. Counseling  Your health care provider may ask you questions about your: Alcohol use. Tobacco use. Drug use. Emotional well-being. Home and relationship well-being. Sexual activity. Eating habits. Work and work Statistician. Screening  You may have the following tests or measurements: Height, weight, and BMI. Blood pressure. Lipid and cholesterol levels. These may be checked every 5 years, or more frequently if you are over 9 years old. Skin check. Lung cancer screening. You may have this screening every year starting at age 41 if you have a 30-pack-year history of smoking and currently smoke or have quit within the past 15 years. Fecal occult blood test (FOBT) of the stool. You may have this test every year starting at age 23. Flexible sigmoidoscopy or colonoscopy. You may have a sigmoidoscopy every 5 years or a colonoscopy every 10 years starting at age 77. Prostate cancer screening. Recommendations will vary depending on your family history and other risks. Hepatitis C blood test. Hepatitis B blood test. Sexually transmitted disease (STD) testing. Diabetes screening. This is done by checking your blood sugar (glucose) after you have not eaten for a while (fasting). You may have this done every 1-3 years. Discuss your test results, treatment options, and if necessary, the need for more tests with your health care provider. Vaccines  Your health care provider may recommend certain vaccines, such as: Influenza vaccine. This is recommended every year. Tetanus, diphtheria, and acellular pertussis (Tdap, Td) vaccine. You may need a Td booster every 10 years. Zoster vaccine. You may need this after age 3. Pneumococcal 13-valent conjugate (PCV13) vaccine. You may need this if you have certain conditions and have not been vaccinated. Pneumococcal polysaccharide (PPSV23) vaccine. You may need one or two doses  if you smoke cigarettes or if you have certain conditions. Talk to your  health care provider about which screenings and vaccines you need and how often you need them. This information is not intended to replace advice given to you by your health care provider. Make sure you discuss any questions you have with your health care provider. Document Released: 12/09/2015 Document Revised: 08/01/2016 Document Reviewed: 09/13/2015 Elsevier Interactive Patient Education  2017 [place] Prevention in the Home Falls can cause injuries. They can happen to people of all ages. There are many things you can do to make your home safe and to help prevent falls. What can I do on the outside of my home? Regularly fix the edges of walkways and driveways and fix any cracks. Remove anything that might make you trip as you walk through a door, such as a raised step or threshold. Trim any bushes or trees on the path to your home. Use bright outdoor lighting. Clear any walking paths of anything that might make someone trip, such as rocks or tools. Regularly check to see if handrails are loose or broken. Make sure that both sides of any steps have handrails. Any raised decks and porches should have guardrails on the edges. Have any leaves, snow, or ice cleared regularly. Use sand or salt on walking paths during winter. Clean up any spills in your garage right away. This includes oil or grease spills. What can I do in the bathroom? Use night lights. Install grab bars by the toilet and in the tub and shower. Do not use towel bars as grab bars. Use non-skid mats or decals in the tub or shower. If you need to sit down in the shower, use a plastic, non-slip stool. Keep the floor dry. Clean up any water that spills on the floor as soon as it happens. Remove soap buildup in the tub or shower regularly. Attach bath mats securely with double-sided non-slip rug tape. Do not have throw rugs and other things on the floor that can make you trip. What can I do in the bedroom? Use night  lights. Make sure that you have a light by your bed that is easy to reach. Do not use any sheets or blankets that are too big for your bed. They should not hang down onto the floor. Have a firm chair that has side arms. You can use this for support while you get dressed. Do not have throw rugs and other things on the floor that can make you trip. What can I do in the kitchen? Clean up any spills right away. Avoid walking on wet floors. Keep items that you use a lot in easy-to-reach places. If you need to reach something above you, use a strong step stool that has a grab bar. Keep electrical cords out of the way. Do not use floor polish or wax that makes floors slippery. If you must use wax, use non-skid floor wax. Do not have throw rugs and other things on the floor that can make you trip. What can I do with my stairs? Do not leave any items on the stairs. Make sure that there are handrails on both sides of the stairs and use them. Fix handrails that are broken or loose. Make sure that handrails are as long as the stairways. Check any carpeting to make sure that it is firmly attached to the stairs. Fix any carpet that is loose or worn. Avoid having throw rugs at the top  or bottom of the stairs. If you do have throw rugs, attach them to the floor with carpet tape. Make sure that you have a light switch at the top of the stairs and the bottom of the stairs. If you do not have them, ask someone to add them for you. What else can I do to help prevent falls? Wear shoes that: Do not have high heels. Have rubber bottoms. Are comfortable and fit you well. Are closed at the toe. Do not wear sandals. If you use a stepladder: Make sure that it is fully opened. Do not climb a closed stepladder. Make sure that both sides of the stepladder are locked into place. Ask someone to hold it for you, if possible. Clearly mark and make sure that you can see: Any grab bars or handrails. First and last  steps. Where the edge of each step is. Use tools that help you move around (mobility aids) if they are needed. These include: Canes. Walkers. Scooters. Crutches. Turn on the lights when you go into a dark area. Replace any light bulbs as soon as they burn out. Set up your furniture so you have a clear path. Avoid moving your furniture around. If any of your floors are uneven, fix them. If there are any pets around you, be aware of where they are. Review your medicines with your doctor. Some medicines can make you feel dizzy. This can increase your chance of falling. Ask your doctor what other things that you can do to help prevent falls. This information is not intended to replace advice given to you by your health care provider. Make sure you discuss any questions you have with your health care provider. Document Released: 09/08/2009 Document Revised: 04/19/2016 Document Reviewed: 12/17/2014 Elsevier Interactive Patient Education  2017 [employer].

## 2022-01-31 NOTE — Progress Notes (Signed)
Subjective:   [name] is a 61 y.o. male who presents for an Initial Medicare Annual Wellness Visit. Virtual Visit via Telephone Note  I connected with  [name] on 01/31/22 at  2:00 PM EST by telephone and verified that I am speaking with the correct person using two identifiers.  Location: Patient: HOME Provider: [facility] Persons participating in the virtual visit: patient/Nurse Health Advisor   I discussed the limitations, risks, security and privacy concerns of performing an evaluation and management service by telephone and the availability of in person appointments. The patient expressed understanding and agreed to proceed.  Interactive audio and video telecommunications were attempted between this nurse and patient, however failed, due to patient having technical difficulties OR patient did not have access to video capability.  We continued and completed visit with audio only.  Some vital signs may be absent or patient reported.   Chriss Driver, LPN  Review of Systems     Cardiac Risk Factors include: advanced age (>53mn, >>51women);hypertension;dyslipidemia;male gender;sedentary lifestyle;obesity (BMI >30kg/m2);Other (see comment), Risk factor comments: Degenerative Disk Dz.     Objective:    Today's Vitals   01/31/22 1359 01/31/22 1402  Weight: 220 lb (99.8 kg)   Height: '5\' 11"'$  (1.803 m)   PainSc:  5    Body mass index is 30.68 kg/m.  Advanced Directives 01/31/2022 12/19/2018 07/17/2018 04/17/2016 03/20/2015 03/20/2015 03/19/2015  Does Patient Have a Medical Advance Directive? No No No No No No No  Would patient like information on creating a medical advance directive? No - Patient declined No - Patient declined No - Patient declined - No - patient declined information No - patient declined information -    Current Medications (verified) Outpatient Encounter Medications as of 01/31/2022  Medication Sig   aspirin 325 MG tablet Take 325 mg by mouth daily.    atorvastatin (LIPITOR) 80 MG tablet Take 1 tablet (80 mg total) by mouth every morning.   DULoxetine (CYMBALTA) 60 MG capsule Take 1 capsule (60 mg total) by mouth daily.   ezetimibe (ZETIA) 10 MG tablet Take 1 tablet (10 mg total) by mouth daily.   HYDROcodone-acetaminophen (NORCO/VICODIN) 5-325 MG tablet Take 1 tablet by mouth every 8 (eight) hours as needed for severe pain.   hydrOXYzine (ATARAX/VISTARIL) 50 MG tablet Take 0.5-2 tablets (25-100 mg total) by mouth every 8 (eight) hours as needed for anxiety (panic attack).   losartan (COZAAR) 100 MG tablet Take 1 tablet (100 mg total) by mouth daily. For high blood pressure.   [DISCONTINUED] tamsulosin (FLOMAX) 0.4 MG CAPS capsule TAKE ONE CAPSULE BY MOUTH AT BEDTIME   No facility-administered encounter medications on file as of 01/31/2022.    Allergies (verified) Patient has no known allergies.   History: Past Medical History:  Diagnosis Date   Acute pancreatitis    Anxiety    [place] 09/[place]   Benign essential hypertension    per pt denies ,  but is documented in epic by his pcp   Bladder cancer ([facility] urologist-- dr bell   dx 08/ 2019  s/p  TURBT   Bladder tumor    CAD (coronary artery disease)    last cardiac cath 08-12-2014  mild non-obstructive CAD ,  normal LVF   DDD (degenerative disc disease), lumbar    Depression    Diabetes mellitus type 2, diet-controlled [place]    Fatty liver    First degree heart block    Headache(784.0)  History of acute pancreatitis 02/2015   History of kidney stones    History of syncope    Hyperlipidemia    Intermediate coronary syndrome [facility])    Intermittent palpitations    takes bystolic   Obesity    Pancreatic divisum 2016   Past Surgical History:  Procedure Laterality Date   CARDIAC CATHETERIZATION  10-08-2002;  03-15-2006   mild non-obstructive CAD,  normal LVF   ENTEROSCOPY N/A 03/20/2015   Procedure: ENTEROSCOPY;  Surgeon: Jerene Bears, MD;  Location: [place];  Service: Endoscopy;  Laterality: N/A;   ERCP  04/2015   stone in pancreatic duct   LEFT HEART CATHETERIZATION WITH CORONARY ANGIOGRAM N/A 08/12/2014   Procedure: LEFT HEART CATHETERIZATION WITH CORONARY ANGIOGRAM;  Surgeon: Burnell Blanks, MD;  Location: [facility] CATH LAB;  Service: Cardiovascular;  Laterality: N/A;   POSTERIOR LUMBAR FUSION  2010   L3 -- 5   TONSILLECTOMY  1979  age 57   TRANSURETHRAL RESECTION OF BLADDER TUMOR WITH MITOMYCIN-C N/A 07/23/2018   Procedure: TRANSURETHRAL RESECTION OF BLADDER TUMOR WITH GEMCITABINE;  Surgeon: Lucas Mallow, MD;  Location[facility];  Service: Urology;  Laterality: N/A;   TRANSURETHRAL RESECTION OF BLADDER TUMOR WITH MITOMYCIN-C N/A 12/19/2018   Procedure: TRANSURETHRAL RESECTION OF BLADDER TUMOR WITH GEMCITABINE;  Surgeon: Lucas Mallow, MD;  [facility];  Service: Urology;  Laterality: N/A;   Family History  Problem Relation Age of Onset   Heart attack Father        father died of heart attach in his 83s   Hyperlipidemia Father    Early death Father    Lung cancer Mother    Hyperlipidemia Mother    Hyperlipidemia Brother    Alzheimer's disease Maternal Grandmother    Colon cancer Neg Hx    Colon polyps Neg Hx    Esophageal cancer Neg Hx    Rectal cancer Neg Hx    Stomach cancer Neg Hx    Social History   Socioeconomic History   Marital status: Soil scientist    Spouse name: Not on file   Number of children: 2   Years of education: Not on file   Highest education level: Not on file  Occupational History   Not on file  Tobacco Use   Smoking status: Every Day    Packs/day: 1.50    Years: 20.00    Pack years: 30.00    Types: Cigarettes    Last attempt to quit: 11/26/2017    Years since quitting: 4.1   Smokeless tobacco: Former    Types: Snuff    Quit date: 07/27/2008  Vaping Use   Vaping Use: Never used  Substance and Sexual Activity   Alcohol use: Yes    Alcohol/week: 0.0  standard drinks    Comment: socially   Drug use: No   Sexual activity: Never  Other Topics Concern   Not on file  Social History Narrative   ** Merged History Encounter **       Social Determinants of Health   Financial Resource Strain: Medium Risk   Difficulty of Paying Living Expenses: Somewhat hard  Food Insecurity: Landscape architect Present   Worried About Charity fundraiser in the Last Year: Sometimes true   Ran Out of Food in the Last Year: Never true  Transportation Needs: No Transportation Needs   Lack of Transportation (Medical): No   Lack of Transportation (Non-Medical): No  Physical Activity: Inactive  Days of Exercise per Week: 0 days   Minutes of Exercise per Session: 0 min  Stress: Stress Concern Present   Feeling of Stress : To some extent  Social Connections: Moderately Isolated   Frequency of Communication with Friends and Family: Three times a week   Frequency of Social Gatherings with Friends and Family: Once a week   Attends Religious Services: 1 to 4 times per year   Active Member of [employer] or Organizations: No   Attends Music therapist: Never   Marital Status: Divorced    Tobacco Counseling Ready to quit: Not Answered Counseling given: Not Answered   Clinical Intake:  Pre-visit preparation completed: Yes  Pain : 0-10 Pain Score: 5  Pain Type: Chronic pain Pain Location: Back Pain Descriptors / Indicators: Aching, Dull Pain Onset: More than a month ago Pain Frequency: Intermittent     BMI - recorded: 30.68 Nutritional Risks: None Diabetes: No  How often do you need to have someone help you when you read instructions, pamphlets, or other written materials from your doctor or pharmacy?: 2 - Rarely  Diabetic?No     Information entered by :: mj Dennison Mcdaid, lpn   Activities of Daily Living In your present state of health, do you have any difficulty performing the following activities: 01/31/2022 01/30/2022  Hearing? Tempie Donning   Vision? N N  Difficulty concentrating or making decisions? N N  Walking or climbing stairs? N N  Dressing or bathing? N N  Doing errands, shopping? N N  Preparing Food and eating ? N N  Using the Toilet? N N  In the past six months, have you accidently leaked urine? N N  Do you have problems with loss of bowel control? N N  Managing your Medications? N N  Managing your Finances? N N  Housekeeping or managing your Housekeeping? N N  Some recent data might be hidden    Patient Care Team: Janora Norlander, DO as PCP - General (Family Medicine)  Indicate any recent Medical Services you may have received from other than [facility] providers in the past year (date may be approximate).     Assessment:   This is a routine wellness examination for [facility]  Hearing/Vision screen Hearing Screening - Comments:: Hearing issues. No hearing aids at this time.  Vision Screening - Comments:: Readers. [facility] 2022.  Dietary issues and exercise activities discussed: Current Exercise Habits: The patient does not participate in regular exercise at present, Exercise limited by: cardiac condition(s);orthopedic condition(s);psychological condition(s)   Goals Addressed             This Visit's Progress    Exercise 3x per week (30 min per time)       Try to increase exercise and try chair exercises.        Depression Screen PHQ 2/9 Scores 01/31/2022 06/14/2021 06/20/2020 11/17/2019 06/04/2018 05/07/2018 04/02/2018  PHQ - 2 Score 1 0 '3 2 1 5 '$ 0  PHQ- 9 Score - - '3 10 5 16 '$ -    Fall Risk Fall Risk  01/31/2022 01/30/2022 06/14/2021 06/20/2020 09/30/2013  Falls in the past year? 0 0 0 0 No  Number falls in past yr: 0 - - - -  Injury with Fall? 0 - - - -  Risk for fall due to : No Fall Risks - - - -  Follow up Falls prevention discussed - - - -    [place]:  Any  stairs in or around the home? Yes  If so, are there any without handrails? Yes  Home free of loose  throw rugs in walkways, pet beds, electrical cords, etc? Yes  Adequate lighting in your home to reduce risk of falls? Yes   ASSISTIVE DEVICES UTILIZED TO PREVENT FALLS:  Life alert? No  Use of a cane, walker or w/c? No  Grab bars in the bathroom? Yes  Shower chair or bench in shower? No  Elevated toilet seat or a handicapped toilet? Yes   TIMED UP AND GO:  Was the test performed? No .  Phone visit. Cognitive Function:     6CIT Screen 01/31/2022  What Year? 0 points  What month? 0 points  What time? 0 points  Count back from 20 0 points  Months in reverse 0 points  Repeat phrase 0 points  Total Score 0    Immunizations Immunization History  Administered Date(s) Administered   Influenza Inj Mdck Quad Pf 10/15/2018   Influenza,inj,Quad PF,6+ Mos 09/07/2015, 09/05/2016, 10/02/2017, 10/14/2019, 08/14/2021   PFIZER(Purple Top)SARS-COV-2 Vaccination 05/05/2020, 05/26/2020   Tdap 06/07/2010    TDAP status: Due, Education has been provided regarding the importance of this vaccine. Advised may receive this vaccine at local pharmacy or Health Dept. Aware to provide a copy of the vaccination record if obtained from local pharmacy or Health Dept. Verbalized acceptance and understanding.  Flu Vaccine status: Up to date  Pneumococcal vaccine status: Due, Education has been provided regarding the importance of this vaccine. Advised may receive this vaccine at local pharmacy or Health Dept. Aware to provide a copy of the vaccination record if obtained from local pharmacy or Health Dept. Verbalized acceptance and understanding.  Covid-19 vaccine status: Completed vaccines  Qualifies for Shingles Vaccine? Yes   Zostavax completed No   Shingrix Completed?: No.    Education has been provided regarding the importance of this vaccine. Patient has been advised to call insurance company to determine out of pocket expense if they have not yet received this vaccine. Advised may also receive vaccine  at local pharmacy or Health Dept. Verbalized acceptance and understanding.  Screening Tests Health Maintenance  Topic Date Due   Zoster Vaccines- Shingrix (1 of 2) Never done   COVID-19 Vaccine (3 - Pfizer risk series) 06/23/2020   TETANUS/TDAP  08/14/2022 (Originally 06/07/2020)   COLONOSCOPY (Pts 45-55yr Insurance coverage will need to be confirmed)  05/22/2026   INFLUENZA VACCINE  Completed   Hepatitis C Screening  Completed   HIV Screening  Completed   HPV VACCINES  Aged Out    Health Maintenance  Health Maintenance Due  Topic Date Due   Zoster Vaccines- Shingrix (1 of 2) Never done   COVID-19 Vaccine (3 - Pfizer risk series) 06/23/2020    Colorectal cancer screening: Type of screening: Colonoscopy. Completed 05/22/2016. Repeat every 10 years  Lung Cancer Screening: (Low Dose CT Chest recommended if Age 60-80years, 30 pack-year currently smoking OR have quit w/in 15years.) does qualify.   Lung Cancer Screening Referral: Order placed on 06/14/21 but pt has not been contacted. Order replaced.   Additional Screening:  Hepatitis C Screening: does qualify; Completed 06/22/2020  Vision Screening: Recommended annual ophthalmology exams for early detection of glaucoma and other disorders of the eye. Is the patient up to date with their annual eye exam?  Yes  Who is the provider or what is the name of the office in which the patient attends annual eye exams? [facility]  If pt is not established with a provider, would they like to be referred to a provider to establish care? No .   Dental Screening: Recommended annual dental exams for proper oral hygiene  Community Resource Referral / Chronic Care Management: CRR required this visit?  No   CCM required this visit?  No      Plan:     I have personally reviewed and noted the following in the patients chart:   Medical and social history Use of alcohol, tobacco or illicit drugs  Current medications and supplements  including opioid prescriptions. Patient is currently taking opioid prescriptions. Information provided to patient regarding non-opioid alternatives. Patient advised to discuss non-opioid treatment plan with their provider. Functional ability and status Nutritional status Physical activity Advanced directives List of other physicians Hospitalizations, surgeries, and ER visits in previous 12 months Vitals Screenings to include cognitive, depression, and falls Referrals and appointments  In addition, I have reviewed and discussed with patient certain preventive protocols, quality metrics, and best practice recommendations. A written personalized care plan for preventive services as well as general preventive health recommendations were provided to patient.     Chriss Driver, LPN   12/29/7626   Nurse Notes: Pt c/o issues with stress and food insecurity. Offered CRR referral for both but patient declined at this time. Pt states he will call if he feels like he is in need of additional resources. Discussed Shingrix and how to obtain. Lung Cancer screening test ordered 06/14/2021 but patient states he has not received a call to schedule.

## 2022-02-22 ENCOUNTER — Ambulatory Visit (INDEPENDENT_AMBULATORY_CARE_PROVIDER_SITE_OTHER): Payer: Medicare Other | Admitting: Family Medicine

## 2022-02-22 ENCOUNTER — Encounter: Payer: Self-pay | Admitting: Family Medicine

## 2022-02-22 VITALS — BP 139/87 | HR 66 | Temp 98.0°F | Ht 71.0 in | Wt 223.2 lb

## 2022-02-22 DIAGNOSIS — Z9889 Other specified postprocedural states: Secondary | ICD-10-CM

## 2022-02-22 DIAGNOSIS — Z79899 Other long term (current) drug therapy: Secondary | ICD-10-CM | POA: Diagnosis not present

## 2022-02-22 DIAGNOSIS — F418 Other specified anxiety disorders: Secondary | ICD-10-CM | POA: Diagnosis not present

## 2022-02-22 DIAGNOSIS — M549 Dorsalgia, unspecified: Secondary | ICD-10-CM | POA: Diagnosis not present

## 2022-02-22 MED ORDER — BUSPIRONE HCL 7.5 MG PO TABS: 7.5000 mg | ORAL_TABLET | Freq: Three times a day (TID) | ORAL | 3 refills | Status: DC

## 2022-02-22 MED ORDER — HYDROCODONE-ACETAMINOPHEN 5-325 MG PO TABS: 1.0000 | ORAL_TABLET | Freq: Three times a day (TID) | ORAL | 0 refills | Status: DC | PRN

## 2022-02-22 NOTE — Progress Notes (Signed)
? ?Subjective: ?CC: Follow-up chronic pain ?PCP: Janora Norlander, DO ?[name] is a 61 y.o. male presenting to clinic today for: ? ?1.  Chronic back pain ?Patient with chronic back pain with history of spinal surgery.  He is chronically treated with as needed Norco with last Rx in July 2022.  He uses this sparingly only if he is having a flareup of back pain.  He denies any excessive daytime sedation, falls, respiratory pression, visual or auditory hallucinations or constipation with the medicine.  He never drinks alcohol when taking.  Last dose was yesterday ? ?2.  Anxiety disorder ?Patient reports that he is having increasing anxiety and panic attack despite use of Cymbalta and as needed Atarax.  Previously treated with as needed Xanax which was helpful but there was concern given chronic need for Norco so that was discontinued.  He would like to try something else as his symptoms are not well controlled at the moment ? ? ?ROS: Per HPI ? ?No Known Allergies ?Past Medical History:  ?Diagnosis Date  ? Acute pancreatitis   ? Anxiety   ? Atrial flutter ([place]) 09/30/2013  ? Benign essential hypertension   ? per pt denies ,  but is documented in epic by his pcp  ? Bladder cancer [facility]) urologist-- dr bell  ? dx 08/ 2019  s/p  TURBT  ? Bladder tumor   ? CAD (coronary artery disease)   ? last cardiac cath 08-12-2014  mild non-obstructive CAD ,  normal LVF  ? DDD (degenerative disc disease), lumbar   ? Depression   ? Diabetes mellitus type 2, diet-controlled ([place])   ? Fatty liver   ? First degree heart block   ? Headache(784.0)   ? History of acute pancreatitis 02/2015  ? History of kidney stones   ? History of syncope   ? Hyperlipidemia   ? Intermediate coronary syndrome ([place])   ? Intermittent palpitations   ? takes bystolic  ? Obesity   ? Pancreatic divisum 2016  ? ? ?Current Outpatient Medications:  ?  aspirin 325 MG tablet, Take 325 mg by mouth daily., Disp: , Rfl:  ?  atorvastatin (LIPITOR) 80 MG tablet,  Take 1 tablet (80 mg total) by mouth every morning., Disp: 90 tablet, Rfl: 3 ?  DULoxetine (CYMBALTA) 60 MG capsule, Take 1 capsule (60 mg total) by mouth daily., Disp: 90 capsule, Rfl: 3 ?  ezetimibe (ZETIA) 10 MG tablet, Take 1 tablet (10 mg total) by mouth daily., Disp: 90 tablet, Rfl: 3 ?  HYDROcodone-acetaminophen (NORCO/VICODIN) 5-325 MG tablet, Take 1 tablet by mouth every 8 (eight) hours as needed for severe pain., Disp: 90 tablet, Rfl: 0 ?  hydrOXYzine (ATARAX/VISTARIL) 50 MG tablet, Take 0.5-2 tablets (25-100 mg total) by mouth every 8 (eight) hours as needed for anxiety (panic attack)., Disp: 30 tablet, Rfl: 5 ?  losartan (COZAAR) 100 MG tablet, Take 1 tablet (100 mg total) by mouth daily. For high blood pressure., Disp: 90 tablet, Rfl: 3 ?Social History  ? ?Socioeconomic History  ? Marital status: Soil scientist  ?  Spouse name: Not on file  ? Number of children: 2  ? Years of education: Not on file  ? Highest education level: Not on file  ?Occupational History  ? Not on file  ?Tobacco Use  ? Smoking status: Every Day  ?  Packs/day: 1.50  ?  Years: 20.00  ?  Pack years: 30.00  ?  Types: Cigarettes  ?  Last attempt  to quit: 11/26/2017  ?  Years since quitting: 4.2  ? Smokeless tobacco: Former  ?  Types: Snuff  ?  Quit date: 07/27/2008  ?Vaping Use  ? Vaping Use: Never used  ?Substance and Sexual Activity  ? Alcohol use: Yes  ?  Alcohol/week: 0.0 standard drinks  ?  Comment: socially  ? Drug use: No  ? Sexual activity: Never  ?Other Topics Concern  ? Not on file  ?Social History Narrative  ? ** Merged History Encounter **  ?    ? ?Social Determinants of Health  ? ?Financial Resource Strain: Medium Risk  ? Difficulty of Paying Living Expenses: Somewhat hard  ?Food Insecurity: Food Insecurity Present  ? Worried About Charity fundraiser in the Last Year: Sometimes true  ? Ran Out of Food in the Last Year: Never true  ?Transportation Needs: No Transportation Needs  ? Lack of Transportation (Medical): No  ? Lack  of Transportation (Non-Medical): No  ?Physical Activity: Inactive  ? Days of Exercise per Week: 0 days  ? Minutes of Exercise per Session: 0 min  ?Stress: Stress Concern Present  ? Feeling of Stress : To some extent  ?Social Connections: Moderately Isolated  ? Frequency of Communication with Friends and Family: Three times a week  ? Frequency of Social Gatherings with Friends and Family: Once a week  ? Attends Religious Services: 1 to 4 times per year  ? Active Member of Clubs or Organizations: No  ? Attends Archivist Meetings: Never  ? Marital Status: Divorced  ?Intimate Partner Violence: Not At Risk  ? Fear of Current or Ex-Partner: No  ? Emotionally Abused: No  ? Physically Abused: No  ? Sexually Abused: No  ? ?Family History  ?Problem Relation Age of Onset  ? Heart attack Father   ?     father died of heart attach in his 29s  ? Hyperlipidemia Father   ? Early death Father   ? Lung cancer Mother   ? Hyperlipidemia Mother   ? Hyperlipidemia Brother   ? Alzheimer's disease Maternal Grandmother   ? Colon cancer Neg Hx   ? Colon polyps Neg Hx   ? Esophageal cancer Neg Hx   ? Rectal cancer Neg Hx   ? Stomach cancer Neg Hx   ? ? ?Objective: ?Office vital signs reviewed. ?BP 139/87   Pulse 66   Temp 98 ?F (36.7 ?C)   Ht '5\' 11"'$  (1.803 m)   Wt 223 lb 3.2 oz (101.2 kg)   SpO2 95%   BMI 31.13 kg/m?  ? ?Physical Examination:  ?General: Awake, alert, well nourished, No acute distress ?HEENT: Sclera white.  Moist mucous membranes ?Cardio: regular rate and rhythm, S1S2 heard, no murmurs appreciated ?Pulm: clear to auscultation bilaterally, no wheezes, rhonchi or rales; normal work of breathing on room air ?MSK: Ambulating independently and gait is normal ?Psych mood is stable, speech is normal.  Patient is pleasant and interactive ? ?  02/22/2022  ?  1:27 PM 01/31/2022  ?  2:09 PM 06/14/2021  ?  3:00 PM  ?Depression screen PHQ 2/9  ?Decreased Interest 1 0 0  ?Down, Depressed, Hopeless 1 1 0  ?PHQ - 2 Score 2 1 0   ?Altered sleeping 1    ?Tired, decreased energy 1    ?Change in appetite 0    ?Feeling bad or failure about yourself  0    ?Trouble concentrating 0    ?Moving slowly or fidgety/restless 0    ?  Suicidal thoughts 0    ?PHQ-9 Score 4    ?Difficult doing work/chores Not difficult at all    ? ? ?  02/22/2022  ?  1:28 PM 06/14/2021  ?  3:00 PM 06/20/2020  ?  1:05 PM  ?GAD 7 : Generalized Anxiety Score  ?Nervous, Anxious, on Edge '1 1 1  '$ ?Control/stop worrying 1 0 0  ?Worry too much - different things 1 1 0  ?Trouble relaxing 0 0 0  ?Restless 0 0 0  ?Easily annoyed or irritable 0 0 0  ?Afraid - awful might happen 0 0 0  ?Total GAD 7 Score '3 2 1  '$ ?Anxiety Difficulty Not difficult at all Not difficult at all Not difficult at all  ? ?Assessment/ Plan: ?61 y.o. male  ? ?Back pain with history of spinal surgery - Plan: ToxASSURE Select 13 (MW), Urine, HYDROcodone-acetaminophen (NORCO/VICODIN) 5-325 MG tablet ? ?Controlled substance agreement signed - Plan: ToxASSURE Select 13 (MW), Urine ? ?Depression with anxiety - Plan: busPIRone (BUSPAR) 7.5 MG tablet ? ?Back pain is chronic and stable.  Last Norco use was yesterday.  Refills have been sent.  CSA and UDS were updated as per office policy.  National narcotic database reviewed and there were no red flags ? ?Depression and anxiety are not extremely well controlled and the Atarax has not been helpful.  Unfortunately, given chronic need for opioids benzodiazepines are not an option.  We will trial BuSpar.  Okay to continue Cymbalta.  Follow-up in the next 1 to 2 months, sooner if concerns arise ? ?Orders Placed This Encounter  ?Procedures  ? ToxASSURE Select 13 (MW), Urine  ?  Current Outpatient Medications:  ??  aspirin 325 MG tablet, Take 325 mg by mouth daily., Disp: , Rfl:  ??  atorvastatin (LIPITOR) 80 MG tablet, Take 1 tablet (80 mg total) by mouth every morning., Disp: 90 tablet, Rfl: 3 ??  DULoxetine (CYMBALTA) 60 MG capsule, Take 1 capsule (60 mg total) by mouth daily.,  Disp: 90 capsule, Rfl: 3 ??  ezetimibe (ZETIA) 10 MG tablet, Take 1 tablet (10 mg total) by mouth daily., Disp: 90 tablet, Rfl: 3 ??  HYDROcodone-acetaminophen (NORCO/VICODIN) 5-325 MG tablet, Take 1 tablet by mo

## 2022-02-28 LAB — TOXASSURE SELECT 13 (MW), URINE

## 2022-03-19 ENCOUNTER — Other Ambulatory Visit: Payer: Self-pay

## 2022-03-19 DIAGNOSIS — Z87891 Personal history of nicotine dependence: Secondary | ICD-10-CM

## 2022-03-19 DIAGNOSIS — Z122 Encounter for screening for malignant neoplasm of respiratory organs: Secondary | ICD-10-CM

## 2022-03-27 ENCOUNTER — Encounter: Payer: Self-pay | Admitting: Acute Care

## 2022-03-27 ENCOUNTER — Ambulatory Visit (INDEPENDENT_AMBULATORY_CARE_PROVIDER_SITE_OTHER): Payer: Medicare Other | Admitting: Acute Care

## 2022-03-27 DIAGNOSIS — Z87891 Personal history of nicotine dependence: Secondary | ICD-10-CM | POA: Diagnosis not present

## 2022-03-27 NOTE — Progress Notes (Signed)
Virtual Visit via Telephone Note ? ?I connected with [name] on 03/27/22 at 10:30 AM EDT by telephone and verified that I am speaking with the correct person using two identifiers. ? ?Location: ?Patient: At home ?Provider:  [facility], [place], [place], [address]  ?  ?I discussed the limitations, risks, security and privacy concerns of performing an evaluation and management service by telephone and the availability of in person appointments. I also discussed with the patient that there may be a patient responsible charge related to this service. The patient expressed understanding and agreed to proceed. ? ? ? ?Shared Decision Making Visit Lung Cancer Screening Program ?[phone]) ? ? ?Eligibility: ?Age 61 y.o. ?Pack Years Smoking History Calculation 60 pack year smoking history ?(# packs/per year x # years smoked) ?Recent History of coughing up blood  no ?Unexplained weight loss? no ?( >Than 15 pounds within the last 6 months ) ?Prior History Lung / other cancer no ?(Diagnosis within the last 5 years already requiring surveillance chest CT Scans). ?Smoking Status Former Smoker ?Former Smokers: Years since quit: 4 years ? Quit Date: 2019 ? ?Visit Components: ?Discussion included one or more decision making aids. yes ?Discussion included risk/benefits of screening. yes ?Discussion included potential follow up diagnostic testing for abnormal scans. yes ?Discussion included meaning and risk of over diagnosis. yes ?Discussion included meaning and risk of False Positives. yes ?Discussion included meaning of total radiation exposure. yes ? ?Counseling Included: ?Importance of adherence to annual lung cancer LDCT screening. yes ?Impact of comorbidities on ability to participate in the program. yes ?Ability and willingness to under diagnostic treatment. yes ? ?Smoking Cessation Counseling: ?Current Smokers:  ?Discussed importance of smoking cessation. yes ?Information about tobacco cessation classes and  interventions provided to patient. yes ?Patient provided with "ticket" for LDCT Scan. yes ?Symptomatic Patient. no ? Counseling NA ?Diagnosis Code: Tobacco Use Z72.0 ?Asymptomatic Patient yes ? Counseling (Intermediate counseling: > three minutes counseling) O8416 ?Former Smokers:  ?Discussed the importance of maintaining cigarette abstinence. yes ?Diagnosis Code: Personal History of Nicotine Dependence. S06.301 ?Information about tobacco cessation classes and interventions provided to patient. Yes ?Patient provided with "ticket" for LDCT Scan. yes ?Written Order for Lung Cancer Screening with LDCT placed in Epic. Yes ?(CT Chest Lung Cancer Screening Low Dose W/O CM) [id number] ?Z12.2-Screening of respiratory organs ?Z87.891-Personal history of nicotine dependence ? ?I spent 25 minutes of face to face time/virtual visit time  with  [name] discussing the risks and benefits of lung cancer screening. We took the time to pause the power point at intervals to allow for questions to be asked and answered to ensure understanding. We discussed that he had taken the single most powerful action possible to decrease his risk of developing lung cancer when he quit smoking. I counseled him to remain smoke free, and to contact me if he ever had the desire to smoke again so that I can provide resources and tools to help support the effort to remain smoke free. We discussed the time and location of the scan, and that either  Doroteo Glassman RN, Joella Prince, RN or I  or I will call / send a letter with the results within  24-72 hours of receiving them. He has the office contact information in the event he needs to speak with me,  he verbalized understanding of all of the above and had no further questions upon leaving the office.  ? ? ? ?I explained to the patient that there has  been a high incidence of coronary artery disease noted on these exams. I explained that this is a non-gated exam therefore degree or severity cannot be  determined. This patient is on statin therapy. I have asked the patient to follow-up with their PCP regarding any incidental finding of coronary artery disease and management with diet or medication as they feel is clinically indicated. The patient verbalized understanding of the above and had no further questions. ?  ? ? ?Magdalen Spatz, NP ?03/27/2022 ? ? ? ? ? ? ? ?

## 2022-03-27 NOTE — Patient Instructions (Signed)
Thank you for participating in the [facility]. It was our pleasure to meet you today. We will call you with the results of your scan within the next few days. Your scan will be assigned a Lung RADS category score by the physicians reading the scans.  This Lung RADS score determines follow up scanning.  See below for description of categories, and follow up screening recommendations. We will be in touch to schedule your follow up screening annually or based on recommendations of our providers. We will fax a copy of your scan results to your Primary Care Physician, or the physician who referred you to the program, to ensure they have the results. Please call the office if you have any questions or concerns regarding your scanning experience or results.  Our office number is [phone]. Please speak with Denise Phelps, RN. , or  Denise Buckner RN, They are  our Lung Cancer Screening RN.'s If They are unavailable when you call, Please leave a message on the voice mail. We will return your call at our earliest convenience.This voice mail is monitored several times a day.  Remember, if your scan is normal, we will scan you annually as long as you continue to meet the criteria for the program. (Age 55-77, Current smoker or smoker who has quit within the last 15 years). If you are a smoker, remember, quitting is the single most powerful action that you can take to decrease your risk of lung cancer and other pulmonary, breathing related problems. We know quitting is hard, and we are here to help.  Please let us know if there is anything we can do to help you meet your goal of quitting. If you are a former smoker, congratulations. We are proud of you! Remain smoke free! Remember you can refer friends or family members through the number above.  We will screen them to make sure they meet criteria for the program. Thank you for helping us take better care of you by  participating in Lung Screening.  You can receive free nicotine replacement therapy ( patches, gum or mints) by calling 1-800-QUIT NOW. Please call so we can get you on the path to becoming  a non-smoker. I know it is hard, but you can do this!  Lung RADS Categories:  Lung RADS 1: no nodules or definitely non-concerning nodules.  Recommendation is for a repeat annual scan in 12 months.  Lung RADS 2:  nodules that are non-concerning in appearance and behavior with a very low likelihood of becoming an active cancer. Recommendation is for a repeat annual scan in 12 months.  Lung RADS 3: nodules that are probably non-concerning , includes nodules with a low likelihood of becoming an active cancer.  Recommendation is for a 6-month repeat screening scan. Often noted after an upper respiratory illness. We will be in touch to make sure you have no questions, and to schedule your 6-month scan.  Lung RADS 4 A: nodules with concerning findings, recommendation is most often for a follow up scan in 3 months or additional testing based on our provider's assessment of the scan. We will be in touch to make sure you have no questions and to schedule the recommended 3 month follow up scan.  Lung RADS 4 B:  indicates findings that are concerning. We will be in touch with you to schedule additional diagnostic testing based on our provider's  assessment of the scan.  Other options for assistance in smoking cessation (   As covered by your insurance benefits)  Hypnosis for smoking cessation  [facility] [employer] [phone]  Acupuncture for smoking cessation  [facility] [phone]   

## 2022-03-28 ENCOUNTER — Ambulatory Visit (HOSPITAL_BASED_OUTPATIENT_CLINIC_OR_DEPARTMENT_OTHER)
Admission: RE | Admit: 2022-03-28 | Discharge: 2022-03-28 | Disposition: A | Discharge status: Home / Self Care | Payer: Medicare Other | Source: Ambulatory Visit | Attending: Family Medicine | Admitting: Family Medicine

## 2022-03-28 ENCOUNTER — Encounter (HOSPITAL_BASED_OUTPATIENT_CLINIC_OR_DEPARTMENT_OTHER): Payer: Self-pay

## 2022-03-28 DIAGNOSIS — Z122 Encounter for screening for malignant neoplasm of respiratory organs: Secondary | ICD-10-CM | POA: Diagnosis not present

## 2022-03-28 DIAGNOSIS — Z87891 Personal history of nicotine dependence: Secondary | ICD-10-CM | POA: Diagnosis not present

## 2022-03-29 ENCOUNTER — Encounter: Payer: Self-pay | Admitting: Family Medicine

## 2022-03-29 ENCOUNTER — Other Ambulatory Visit: Payer: Self-pay

## 2022-03-29 DIAGNOSIS — J432 Centrilobular emphysema: Secondary | ICD-10-CM | POA: Insufficient documentation

## 2022-03-29 DIAGNOSIS — I700 Atherosclerosis of aorta: Secondary | ICD-10-CM | POA: Insufficient documentation

## 2022-03-29 DIAGNOSIS — Z87891 Personal history of nicotine dependence: Secondary | ICD-10-CM

## 2022-03-29 DIAGNOSIS — Z122 Encounter for screening for malignant neoplasm of respiratory organs: Secondary | ICD-10-CM

## 2022-04-02 ENCOUNTER — Encounter: Payer: Self-pay | Admitting: Emergency Medicine

## 2022-04-25 ENCOUNTER — Other Ambulatory Visit: Payer: Self-pay | Admitting: Acute Care

## 2022-04-25 DIAGNOSIS — Z87891 Personal history of nicotine dependence: Secondary | ICD-10-CM

## 2022-04-25 DIAGNOSIS — Z122 Encounter for screening for malignant neoplasm of respiratory organs: Secondary | ICD-10-CM

## 2022-05-21 DIAGNOSIS — M75101 Unspecified rotator cuff tear or rupture of right shoulder, not specified as traumatic: Secondary | ICD-10-CM | POA: Diagnosis not present

## 2022-05-21 DIAGNOSIS — M25511 Pain in right shoulder: Secondary | ICD-10-CM | POA: Diagnosis not present

## 2022-05-28 ENCOUNTER — Ambulatory Visit (INDEPENDENT_AMBULATORY_CARE_PROVIDER_SITE_OTHER): Payer: Medicare Other | Admitting: Nurse Practitioner

## 2022-05-28 ENCOUNTER — Encounter: Payer: Self-pay | Admitting: Nurse Practitioner

## 2022-05-28 VITALS — BP 140/88 | HR 76 | Temp 98.2°F | Resp 20 | Ht 71.0 in | Wt 222.0 lb

## 2022-05-28 DIAGNOSIS — F418 Other specified anxiety disorders: Secondary | ICD-10-CM

## 2022-05-28 MED ORDER — DULOXETINE HCL 60 MG PO CPEP: 120.0000 mg | ORAL_CAPSULE | Freq: Every day | ORAL | 1 refills | Status: DC

## 2022-05-28 NOTE — Patient Instructions (Signed)

## 2022-05-28 NOTE — Progress Notes (Signed)
Subjective:    Patient ID: [name], male    DOB: March 03, 1961, 61 y.o.   MRN: [id number]   Chief Complaint: Anxiety and Depression   Anxiety Patient reports no chest pain, dizziness, palpitations or shortness of breath.    Depression        Associated symptoms include no headaches.  Past medical history includes anxiety.     Patient in today c/o anxiety and depression. He is currently on buspar and cymbalta. He has been on lexapro in the past which did not help. He says he quit smoking several months back and started vaping. He has stopped vaping , so now no nicotine and that is when he started getting anxious and depressed. He says that the buspar does not help him. He was on xanx in the past and that worked better. But since he is on pain meds they stopped his xanax.    05/28/2022   12:07 PM 02/22/2022    1:27 PM 01/31/2022    2:09 PM  Depression screen PHQ 2/9  Decreased Interest 2 1 0  Down, Depressed, Hopeless '1 1 1  '$ PHQ - 2 Score '3 2 1  '$ Altered sleeping 2 1   Tired, decreased energy 1 1   Change in appetite 1 0   Feeling bad or failure about yourself  2 0   Trouble concentrating 1 0   Moving slowly or fidgety/restless 1 0   Suicidal thoughts 0 0   PHQ-9 Score 11 4   Difficult doing work/chores Somewhat difficult Not difficult at all       05/28/2022   12:07 PM 02/22/2022    1:28 PM 06/14/2021    3:00 PM 06/20/2020    1:05 PM  GAD 7 : Generalized Anxiety Score  Nervous, Anxious, on Edge '2 1 1 1  '$ Control/stop worrying 2 1 0 0  Worry too much - different things '2 1 1 '$ 0  Trouble relaxing 2 0 0 0  Restless 2 0 0 0  Easily annoyed or irritable 2 0 0 0  Afraid - awful might happen 1 0 0 0  Total GAD 7 Score '13 3 2 1  '$ Anxiety Difficulty Somewhat difficult Not difficult at all Not difficult at all Not difficult at all      Review of Systems  Constitutional:  Negative for diaphoresis.  Eyes:  Negative for pain.  Respiratory:  Negative for shortness of breath.    Cardiovascular:  Negative for chest pain, palpitations and leg swelling.  Gastrointestinal:  Negative for abdominal pain.  Endocrine: Negative for polydipsia.  Skin:  Negative for rash.  Neurological:  Negative for dizziness, weakness and headaches.  Hematological:  Does not bruise/bleed easily.  Psychiatric/Behavioral:  Positive for depression.   All other systems reviewed and are negative.      Objective:   Physical Exam Vitals and nursing note reviewed.  Constitutional:      Appearance: Normal appearance. He is well-developed.  Neck:     Thyroid: No thyroid mass or thyromegaly.     Vascular: No carotid bruit or JVD.     Trachea: Phonation normal.  Cardiovascular:     Rate and Rhythm: Normal rate and regular rhythm.  Pulmonary:     Effort: Pulmonary effort is normal. No respiratory distress.     Breath sounds: Normal breath sounds.  Abdominal:     General: Bowel sounds are normal.     Palpations: Abdomen is soft.     Tenderness: There is  no abdominal tenderness.  Musculoskeletal:        General: Normal range of motion.     Cervical back: Normal range of motion and neck supple.  Lymphadenopathy:     Cervical: No cervical adenopathy.  Skin:    General: Skin is warm and dry.  Neurological:     Mental Status: He is alert and oriented to person, place, and time.  Psychiatric:        Behavior: Behavior normal.        Thought Content: Thought content normal.        Judgment: Judgment normal.     BP 140/88   Pulse 76   Temp 98.2 F (36.8 C) (Temporal)   Resp 20   Ht '5\' 11"'$  (1.803 m)   Wt 222 lb (100.7 kg)   SpO2 94%   BMI 30.96 kg/m        Assessment & Plan:  [name] in today with chief complaint of Anxiety and Depression   1. Depression with anxiety Try taking 2 buspar when needed- if helps will increase dose Increase cymbalta to '120mg'$  daily Stress management Follow up with PCP as needed - DULoxetine (CYMBALTA) 60 MG capsule; Take 2 capsules  (120 mg total) by mouth daily.  Dispense: 180 capsule; Refill: 1    The above assessment and management plan was discussed with the patient. The patient verbalized understanding of and has agreed to the management plan. Patient is aware to call the clinic if symptoms persist or worsen. Patient is aware when to return to the clinic for a follow-up visit. Patient educated on when it is appropriate to go to the emergency department.   Mary-Margaret Hassell Done, FNP

## 2022-06-21 ENCOUNTER — Telehealth: Payer: Self-pay | Admitting: Nurse Practitioner

## 2022-06-21 DIAGNOSIS — F418 Other specified anxiety disorders: Secondary | ICD-10-CM

## 2022-06-21 MED ORDER — DULOXETINE HCL 30 MG PO CPEP: 30.0000 mg | ORAL_CAPSULE | Freq: Every day | ORAL | 3 refills | Status: DC

## 2022-06-21 NOTE — Telephone Encounter (Signed)
Please advise 

## 2022-06-21 NOTE — Telephone Encounter (Signed)
Pt had visit with MMM on 05/28/22 for anxiety/depression and told MMM that he was currently taking '120mg'$  of Cymbalta (60 mg twice daily) and explained that the medicine was too much for him and he wanted to come down on the dosage. Says he was told that Cymbalta doesn't come in a '30mg'$  but called [facility] for advise and was told that they do have '30mg'$  Cymbalta in stock. Wants to know if he can take one '60mg'$  tablet and one '30mg'$  tablet daily ('90mg'$  total) and see if that helps?  Please advise.

## 2022-06-25 MED ORDER — DULOXETINE HCL 60 MG PO CPEP: 60.0000 mg | ORAL_CAPSULE | Freq: Every day | ORAL | 1 refills | Status: DC

## 2022-06-25 NOTE — Addendum Note (Signed)
Addended by: Chevis Pretty on: 06/25/2022 01:29 PM   Modules accepted: Orders

## 2022-06-25 NOTE — Telephone Encounter (Signed)
That will be fine- I will send in prescription for '30mg'$  tablets to [facility]

## 2022-06-25 NOTE — Telephone Encounter (Signed)
Left detailed message on patients voicemail with instructions 

## 2022-07-04 ENCOUNTER — Encounter: Payer: Self-pay | Admitting: Family Medicine

## 2022-07-04 ENCOUNTER — Ambulatory Visit (INDEPENDENT_AMBULATORY_CARE_PROVIDER_SITE_OTHER): Payer: Medicare Other | Admitting: Family Medicine

## 2022-07-04 VITALS — BP 137/83 | HR 98 | Temp 97.3°F | Ht 71.0 in | Wt 224.8 lb

## 2022-07-04 DIAGNOSIS — F418 Other specified anxiety disorders: Secondary | ICD-10-CM | POA: Diagnosis not present

## 2022-07-04 DIAGNOSIS — E785 Hyperlipidemia, unspecified: Secondary | ICD-10-CM | POA: Diagnosis not present

## 2022-07-04 DIAGNOSIS — I10 Essential (primary) hypertension: Secondary | ICD-10-CM

## 2022-07-04 DIAGNOSIS — Z9889 Other specified postprocedural states: Secondary | ICD-10-CM | POA: Diagnosis not present

## 2022-07-04 DIAGNOSIS — M549 Dorsalgia, unspecified: Secondary | ICD-10-CM | POA: Diagnosis not present

## 2022-07-04 MED ORDER — EZETIMIBE 10 MG PO TABS: 10.0000 mg | ORAL_TABLET | Freq: Every day | ORAL | 3 refills | Status: AC

## 2022-07-04 MED ORDER — HYDROCODONE-ACETAMINOPHEN 5-325 MG PO TABS: 1.0000 | ORAL_TABLET | Freq: Three times a day (TID) | ORAL | 0 refills | Status: DC | PRN

## 2022-07-04 MED ORDER — ATORVASTATIN CALCIUM 80 MG PO TABS: 80.0000 mg | ORAL_TABLET | Freq: Every morning | ORAL | 3 refills | Status: AC

## 2022-07-04 MED ORDER — LOSARTAN POTASSIUM 100 MG PO TABS: 100.0000 mg | ORAL_TABLET | Freq: Every day | ORAL | 3 refills | Status: AC

## 2022-07-04 MED ORDER — DULOXETINE HCL 30 MG PO CPEP: 30.0000 mg | ORAL_CAPSULE | Freq: Every day | ORAL | 3 refills | Status: DC

## 2022-07-04 MED ORDER — DULOXETINE HCL 60 MG PO CPEP: 60.0000 mg | ORAL_CAPSULE | Freq: Every day | ORAL | 3 refills | Status: DC

## 2022-07-04 NOTE — Progress Notes (Signed)
Subjective: WI:OXBDZHGDJM/ anxiety PCP: Janora Norlander, DO EQA:STMHD [name] is Stephen 61 y.o. male presenting to clinic today for:  1. Depression/ anxiety Cymbalta increased to '90mg'$  and he notes that this does seem to be working better for him.  He continues to struggle with both depression and anxiety but the anxiety has been under better control with the new regimen.  He has not needed the BuSpar at the increased dose thus far.  He would like to go ahead and start seeing Stephen counselor.  Previously seen by Coralyn Mark but really stresses that he would like in person counseling, not virtual visits as he feels disconnected and has difficulty hearing virtual visits.  2.  Hypertension with hyperlipidemia Patient reports compliance with medications.  Needs refills.  3.  Chronic pain syndrome Patient with chronic back pain.  He uses Norco as needed.  Last fill was in March.  He admits that he has been experiencing some worsening back pain than normal but is still not utilizing this medication daily.  Would like to have Stephen refill on hand should he need it.  No constipation or excessive daytime sedation reported with medications.  ROS: Per HPI  No Known Allergies Past Medical History:  Diagnosis Date   Acute pancreatitis    Anxiety    Atrial flutter ([place]) 09/30/2013   Benign essential hypertension    per pt denies ,  but is documented in epic by his pcp   Bladder cancer [facility]) urologist-- dr bell   dx 08/ 2019  s/p  TURBT   Bladder tumor    CAD (coronary artery disease)    last cardiac cath 08-12-2014  mild non-obstructive CAD ,  normal LVF   DDD (degenerative disc disease), lumbar    Depression    Diabetes mellitus type 2, diet-controlled ([place])    Fatty liver    First degree heart block    Headache(784.0)    History of acute pancreatitis 02/2015   History of kidney stones    History of syncope    Hyperlipidemia    Intermediate coronary syndrome (HCC)    Intermittent palpitations     takes bystolic   Obesity    Pancreatic divisum 2016    Current Outpatient Medications:    aspirin 325 MG tablet, Take 325 mg by mouth daily., Disp: , Rfl:    atorvastatin (LIPITOR) 80 MG tablet, Take 1 tablet (80 mg total) by mouth every morning., Disp: 90 tablet, Rfl: 3   busPIRone (BUSPAR) 7.5 MG tablet, Take 1 tablet (7.5 mg total) by mouth 3 (three) times daily. For anxiety, Disp: 90 tablet, Rfl: 3   DULoxetine (CYMBALTA) 30 MG capsule, Take 1 capsule (30 mg total) by mouth daily., Disp: 30 capsule, Rfl: 3   DULoxetine (CYMBALTA) 60 MG capsule, Take 1 capsule (60 mg total) by mouth daily., Disp: 90 capsule, Rfl: 1   ezetimibe (ZETIA) 10 MG tablet, Take 1 tablet (10 mg total) by mouth daily., Disp: 90 tablet, Rfl: 3   HYDROcodone-acetaminophen (NORCO/VICODIN) 5-325 MG tablet, Take 1 tablet by mouth every 8 (eight) hours as needed for severe pain., Disp: 90 tablet, Rfl: 0   losartan (COZAAR) 100 MG tablet, Take 1 tablet (100 mg total) by mouth daily. For high blood pressure., Disp: 90 tablet, Rfl: 3 Social History   Socioeconomic History   Marital status: Soil scientist    Spouse name: Not on file   Number of children: 2   Years of education: Not on file  Highest education level: Not on file  Occupational History   Not on file  Tobacco Use   Smoking status: Every Day    Packs/day: 1.50    Years: 40.00    Total pack years: 60.00    Types: Cigarettes    Last attempt to quit: 11/26/2017    Years since quitting: 4.6   Smokeless tobacco: Former    Types: Snuff    Quit date: 07/27/2008  Vaping Use   Vaping Use: Never used  Substance and Sexual Activity   Alcohol use: Yes    Alcohol/week: 0.0 standard drinks of alcohol    Comment: socially   Drug use: No   Sexual activity: Never  Other Topics Concern   Not on file  Social History Narrative   ** Merged History Encounter **       Social Determinants of Health   Financial Resource Strain: Medium Risk (01/31/2022)   Overall  Financial Resource Strain (CARDIA)    Difficulty of Paying Living Expenses: Somewhat hard  Food Insecurity: Food Insecurity Present (01/31/2022)   Hunger Vital Sign    Worried About Running Out of Food in the Last Year: Sometimes true    Ran Out of Food in the Last Year: Never true  Transportation Needs: No Transportation Needs (01/31/2022)   PRAPARE - Hydrologist (Medical): No    Lack of Transportation (Non-Medical): No  Physical Activity: Inactive (01/31/2022)   Exercise Vital Sign    Days of Exercise per Week: 0 days    Minutes of Exercise per Session: 0 min  Stress: Stress Concern Present (01/31/2022)   [place]    Feeling of Stress : To some extent  Social Connections: Moderately Isolated (01/31/2022)   Social Connection and Isolation Panel [NHANES]    Frequency of Communication with Friends and Family: Three times Stephen week    Frequency of Social Gatherings with Friends and Family: Once Stephen week    Attends Religious Services: 1 to 4 times per year    Active Member of [employer] or Organizations: No    Attends Archivist Meetings: Never    Marital Status: Divorced  Human resources officer Violence: Not At Risk (01/31/2022)   Humiliation, Afraid, Rape, and Kick questionnaire    Fear of Current or Ex-Partner: No    Emotionally Abused: No    Physically Abused: No    Sexually Abused: No   Family History  Problem Relation Age of Onset   Heart attack Father        father died of heart attach in his 88s   Hyperlipidemia Father    Early death Father    Lung cancer Mother    Hyperlipidemia Mother    Hyperlipidemia Brother    Alzheimer's disease Maternal Grandmother    Colon cancer Neg Hx    Colon polyps Neg Hx    Esophageal cancer Neg Hx    Rectal cancer Neg Hx    Stomach cancer Neg Hx     Objective: Office vital signs reviewed. BP 137/83   Pulse 98   Temp (!) 97.3 F (36.3 C)   Ht '5\' 11"'$   (1.803 m)   Wt 224 lb 12.8 oz (102 kg)   SpO2 95%   BMI 31.35 kg/m   Physical Examination:  General: Awake, alert, nontoxic male, No acute distress HEENT: Sclera white.  Moist mucous membranes Cardio: regular rate and rhythm, S1S2 heard, no murmurs appreciated Pulm:  Mild expiratory wheezes noted in the bases.  Otherwise normal work of breathing with no rhonchi, rales. Psych: Affect flat.  Mood stable.  Good eye contact     07/04/2022    2:30 PM 05/28/2022   12:07 PM 02/22/2022    1:27 PM  Depression screen PHQ 2/9  Decreased Interest '1 2 1  '$ Down, Depressed, Hopeless '1 1 1  '$ PHQ - 2 Score '2 3 2  '$ Altered sleeping '2 2 1  '$ Tired, decreased energy '1 1 1  '$ Change in appetite 1 1 0  Feeling bad or failure about yourself  2 2 0  Trouble concentrating 1 1 0  Moving slowly or fidgety/restless 1 1 0  Suicidal thoughts 0 0 0  PHQ-9 Score '10 11 4  '$ Difficult doing work/chores  Somewhat difficult Not difficult at all      07/04/2022    2:30 PM 05/28/2022   12:07 PM 02/22/2022    1:28 PM 06/14/2021    3:00 PM  GAD 7 : Generalized Anxiety Score  Nervous, Anxious, on Edge '2 2 1 1  '$ Control/stop worrying '2 2 1 '$ 0  Worry too much - different things '2 2 1 1  '$ Trouble relaxing 1 2 0 0  Restless 1 2 0 0  Easily annoyed or irritable 1 2 0 0  Afraid - awful might happen 0 1 0 0  Total GAD 7 Score '9 13 3 2  '$ Anxiety Difficulty Somewhat difficult Somewhat difficult Not difficult at all Not difficult at all    Assessment/ Plan: 61 y.o. male   Depression with anxiety - Plan: Ambulatory referral to Psychology, DULoxetine (CYMBALTA) 30 MG capsule, DULoxetine (CYMBALTA) 60 MG capsule  Back pain with history of spinal surgery - Plan: HYDROcodone-acetaminophen (NORCO/VICODIN) 5-325 MG tablet, DULoxetine (CYMBALTA) 30 MG capsule, DULoxetine (CYMBALTA) 60 MG capsule  Benign essential HTN - Plan: losartan (COZAAR) 100 MG tablet  Hyperlipidemia with target low density lipoprotein (LDL) cholesterol less than 100  mg/dL - Plan: ezetimibe (ZETIA) 10 MG tablet, atorvastatin (LIPITOR) 80 MG tablet  Anxiety depression are not totally controlled and therefore we will place referral to counseling services.  Continue Cymbalta at current dose and follow-up as needed on this issue.  If symptoms worsen or do not improve with additional therapies, he is agreeable to referral to psychiatry for medication management.  Norco renewed for as needed use.  Use sparingly.  UDS and CSA are up-to-date.  The national narcotic database reviewed and there were no red flags  Blood pressure upon recheck is normal.  Continue current regimen.  He is overdue for fasting labs.  Encouraged to schedule physical in the next 6 months    No orders of the defined types were placed in this encounter.  No orders of the defined types were placed in this encounter.   Janora Norlander, DO [place] [phone]

## 2022-07-12 DIAGNOSIS — C678 Malignant neoplasm of overlapping sites of bladder: Secondary | ICD-10-CM | POA: Diagnosis not present

## 2022-07-31 DIAGNOSIS — M7130 Other bursal cyst, unspecified site: Secondary | ICD-10-CM | POA: Diagnosis not present

## 2022-07-31 DIAGNOSIS — L700 Acne vulgaris: Secondary | ICD-10-CM | POA: Diagnosis not present

## 2022-07-31 DIAGNOSIS — L851 Acquired keratosis [keratoderma] palmaris et plantaris: Secondary | ICD-10-CM | POA: Diagnosis not present

## 2022-07-31 DIAGNOSIS — L578 Other skin changes due to chronic exposure to nonionizing radiation: Secondary | ICD-10-CM | POA: Diagnosis not present

## 2022-08-01 ENCOUNTER — Ambulatory Visit (INDEPENDENT_AMBULATORY_CARE_PROVIDER_SITE_OTHER): Payer: Medicare Other | Admitting: Clinical

## 2022-08-01 DIAGNOSIS — F4310 Post-traumatic stress disorder, unspecified: Secondary | ICD-10-CM | POA: Diagnosis not present

## 2022-08-01 DIAGNOSIS — F331 Major depressive disorder, recurrent, moderate: Secondary | ICD-10-CM

## 2022-08-01 DIAGNOSIS — F419 Anxiety disorder, unspecified: Secondary | ICD-10-CM

## 2022-08-01 NOTE — Progress Notes (Addendum)
IN PERSON  I connected with [name] on 08/01/22 at 11:00 AM EDT in person and verified that I am speaking with the correct person using two identifiers.  Location: Patient: Office Provider: Office   I discussed the limitations of evaluation and management by telemedicine and the availability of in person appointments. The patient expressed understanding and agreed to proceed.  THERAPIST PROGRESS NOTE   Session Time: 11:00 AM-11:55 AM   Participation Level: Active   Behavioral Response: CasualAlertDepressed   Type of Therapy: Individual Therapy   Treatment Goals addressed: Coping   Interventions: CBT and Strength-based   Summary: [name]  is a 61 y.o. male who presents with PTSD/Depression/Anxiety . The OPT therapist worked with the patient for his ongoing OPT treatment as a return patient. The OPT therapist utilized Motivational Interviewing to assist in creating therapeutic repore. The patient in the session was engaged and work in collaboration giving feedback about his triggers and symptoms over the past few weeks. The patient spoke about life change with plans to move from [place] back to [place], [place]. The OPT therapist utilized Cognitive Behavioral Therapy through cognitive restructuring as well as worked with the patient on reviewing current stressors his use of coping strategies to manage stress around caregiving for his parents. The OPT therapist provided psycho-education throughout the session. The patient gave feedback about how implementing coping that has continued helping him manage his mental health symptoms this and ongoing work on communication has helped him in his interactions with his girlfriend.   Suicidal/Homicidal: Nowithout intent/plan   Therapist Response: The OPT therapist worked with the patient for the patients scheduled session. The patient was engaged in his session and gave feedback in relation to triggers, symptoms, and behavior responses  over the past few weeks. The patient noted improvements in interactions with others. The patient spoke about his recent major changes with moving from [place] back to [place]. The OPT therapist worked with the patient utilizing an in session Cognitive Behavioral Therapy exercise. The OPT therapist worked with the patient on maintaining barriers, implementing coping, and continuing to utilize communication with his girlfriend through upcoming changes. The patient agreed to implement reviewed coping skills.The OPT therapist worked with the patient on continuing self check ins and self care.   Plan: Return again in 2/3 weeks.   Diagnosis:         Axis I: PTSD/Depression/Anxiety    Axis II: No diagnosis   Patient Follow- 2/3 weeks   Collaboration of Care: No additional collaboration of care for this session.     I discussed the assessment and treatment plan with the patient. The patient was provided an opportunity to ask questions and all were answered. The patient agreed with the plan and demonstrated an understanding of the instructions.   The patient was advised to call back or seek an in-person evaluation if the symptoms worsen or if the condition fails to improve as anticipated.   I provided 55 minutes of face-to-face time during this encounter.   Maye Hides, LCSW   08/01/2022

## 2022-09-05 ENCOUNTER — Ambulatory Visit (INDEPENDENT_AMBULATORY_CARE_PROVIDER_SITE_OTHER): Payer: Medicare Other | Admitting: Clinical

## 2022-09-05 DIAGNOSIS — F331 Major depressive disorder, recurrent, moderate: Secondary | ICD-10-CM | POA: Diagnosis not present

## 2022-09-05 DIAGNOSIS — F4310 Post-traumatic stress disorder, unspecified: Secondary | ICD-10-CM | POA: Diagnosis not present

## 2022-09-05 DIAGNOSIS — F419 Anxiety disorder, unspecified: Secondary | ICD-10-CM

## 2022-09-05 NOTE — Progress Notes (Signed)
IN PERSON   I connected with [name] on 09/05/22 at 4:00 PM EDT in person and verified that I am speaking with the correct person using two identifiers.   Location: Patient: Office Provider: Office   I discussed the limitations of evaluation and management by telemedicine and the availability of in person appointments. The patient expressed understanding and agreed to proceed.   THERAPIST PROGRESS NOTE   Session Time:4:00 PM-4:30 PM   Participation Level: Active   Behavioral Response: CasualAlertDepressed   Type of Therapy: Individual Therapy   Treatment Goals addressed: Coping   Interventions: CBT and Strength-based   Summary: [name]  is a 61 y.o. male who presents with PTSD/Depression/Anxiety . The OPT therapist worked with the patient for his ongoing OPT treatment as a return patient. The OPT therapist utilized Motivational Interviewing to assist in creating therapeutic repore. The patient in the session was engaged and work in collaboration giving feedback about his triggers and symptoms over the past few weeks. The patient spoke about life change with plans to move from [place] back to [place], [place] and recently decided he would do a compromise of staying sometimes in [place] and sometimes in [place] with his partner.. The OPT therapist utilized Cognitive Behavioral Therapy through cognitive restructuring as well as worked with the patient on reviewing current stressors his use of coping strategies to manage stress around caregiving for his parents as well as difficulty the patient has been having in his relationship with other family members around making decisions about the care of his parents.. The OPT therapist provided psycho-education throughout the session. The patient gave feedback about how implementing coping that has continued helping him manage his mental health symptoms this and ongoing work on communication has helped him in his interactions with his  girlfriend.   Suicidal/Homicidal: Nowithout intent/plan   Therapist Response: The OPT therapist worked with the patient for the patients scheduled session. The patient was engaged in his session and gave feedback in relation to triggers, symptoms, and behavior responses over the past few weeks. The patient spoke about his decision to go back and forth and not fully commit to living in [place] but going back and forth in living between [place] and [place]. The OPT therapist worked with the patient utilizing an in session Cognitive Behavioral Therapy exercise. The OPT therapist worked with the patient on maintaining barriers, implementing coping, and continuing to utilize communication with his girlfriend through upcoming transitions. The patient agreed to implement reviewed coping skills.The OPT therapist worked with the patient on continuing self check ins and self care. The patient identified regulation and consistency with his MH medication as well as taking a PCP prescribed Testosterone booster has been helpful in management of his MH symptoms and increasing energy levels. The OPT therapist worked with the patient on exercise in my control vs out of my control during the session. The OPT therapist will continue treatment work with the patient in his next scheduled session.    Plan: Return again in 2/3 weeks.   Diagnosis:         Axis I: PTSD/Depression/Anxiety    Axis II: No diagnosis   Patient Follow- 2/3 weeks     Collaboration of Care: No additional collaboration of care for this session.     I discussed the assessment and treatment plan with the patient. The patient was provided an opportunity to ask questions and all were answered. The patient agreed with the plan and demonstrated an understanding of  the instructions.   The patient was advised to call back or seek an in-person evaluation if the symptoms worsen or if the condition fails to improve as anticipated.   I provided 30  minutes of face-to-face time during this encounter.   Maye Hides, LCSW   09/05/2022

## 2022-09-10 DIAGNOSIS — R948 Abnormal results of function studies of other organs and systems: Secondary | ICD-10-CM | POA: Diagnosis not present

## 2022-09-17 DIAGNOSIS — C678 Malignant neoplasm of overlapping sites of bladder: Secondary | ICD-10-CM | POA: Diagnosis not present

## 2022-10-11 ENCOUNTER — Ambulatory Visit (INDEPENDENT_AMBULATORY_CARE_PROVIDER_SITE_OTHER): Payer: Medicare Other | Admitting: Clinical

## 2022-10-11 DIAGNOSIS — F331 Major depressive disorder, recurrent, moderate: Secondary | ICD-10-CM | POA: Diagnosis not present

## 2022-10-11 DIAGNOSIS — F419 Anxiety disorder, unspecified: Secondary | ICD-10-CM | POA: Diagnosis not present

## 2022-10-11 DIAGNOSIS — F4310 Post-traumatic stress disorder, unspecified: Secondary | ICD-10-CM | POA: Diagnosis not present

## 2022-10-11 NOTE — Progress Notes (Signed)
IN PERSON   I connected with [name] on 10/11/22 at 1:00 PM EDT in person and verified that I am speaking with the correct person using two identifiers.   Location: Patient: Office Provider: Office   I discussed the limitations of evaluation and management by telemedicine and the availability of in person appointments. The patient expressed understanding and agreed to proceed.   THERAPIST PROGRESS NOTE   Session Time: 1:00 PM-1:45 PM   Participation Level: Active   Behavioral Response: CasualAlertDepressed   Type of Therapy: Individual Therapy   Treatment Goals addressed: Coping   Interventions: CBT and Strength-based   Summary: [name]  is a 61 y.o. male who presents with PTSD/Depression/Anxiety . The OPT therapist worked with the patient for his ongoing OPT treatment as a return patient. The OPT therapist utilized Motivational Interviewing to assist in creating therapeutic repore. The patient in the session was engaged and work in collaboration giving feedback about his triggers and symptoms over the past few weeks. The patient spoke about recently starting Testosterone treatment with his Doctor this and now feeling his med management is working right has created a notable positive change for the patient who reports improvement with mood, communication, and energy level. The patient identified that now he does not feel the urgency to life change with plans to move from [place] back to [place], [place] and is currently able to enjoy being in [place] more than before.. The OPT therapist utilized Cognitive Behavioral Therapy through cognitive restructuring as well as worked with the patient on reviewing current stressors his use of coping strategies to manage stress around caregiving for his parents as well as  in his relationship with other family members. The OPT therapist provided psycho-education throughout the session. The patient gave feedback about how implementing  coping that has continued helping him manage his mental health symptoms this and ongoing work on communication has helped him in his interactions with his girlfriend and utilization of identified protective factors.   Suicidal/Homicidal: Nowithout intent/plan   Therapist Response: The OPT therapist worked with the patient for the patients scheduled session. The patient was engaged in his session and gave feedback in relation to triggers, symptoms, and behavior responses over the past few weeks. The patient spoke about the changes he has noticed now that he has been doing Testosterone treatment and feels his med management is working correctly. The OPT therapist worked with the patient utilizing an in session Cognitive Behavioral Therapy exercise. The OPT therapist worked with the patient on maintaining barriers, implementing coping, and continuing to utilize communication with his girlfriend as well as other identified protective factors.. The patient agreed to continue to implement reviewed coping skills.The OPT therapist worked with the patient on continuing self check ins and self care.. The OPT therapist worked with the patient on exercise in my control vs out of my control during the session. The OPT therapist will continue treatment work with the patient in his next scheduled session.    Plan: Return again in 2/3 weeks.   Diagnosis:         Axis I: PTSD/Depression/Anxiety    Axis II: No diagnosis   Patient Follow- 2/3 weeks     Collaboration of Care: No additional collaboration of care for this session.     I discussed the assessment and treatment plan with the patient. The patient was provided an opportunity to ask questions and all were answered. The patient agreed with the plan and demonstrated an understanding  of the instructions.   The patient was advised to call back or seek an in-person evaluation if the symptoms worsen or if the condition fails to improve as anticipated.   I  provided 45 minutes of face-to-face time during this encounter.   Maye Hides, LCSW   10/11/2022

## 2022-11-28 ENCOUNTER — Telehealth (HOSPITAL_COMMUNITY): Payer: Self-pay | Admitting: *Deleted

## 2022-11-28 ENCOUNTER — Ambulatory Visit (INDEPENDENT_AMBULATORY_CARE_PROVIDER_SITE_OTHER): Payer: Medicare Other | Admitting: Clinical

## 2022-11-28 DIAGNOSIS — F331 Major depressive disorder, recurrent, moderate: Secondary | ICD-10-CM | POA: Diagnosis not present

## 2022-11-28 DIAGNOSIS — F4310 Post-traumatic stress disorder, unspecified: Secondary | ICD-10-CM | POA: Diagnosis not present

## 2022-11-28 DIAGNOSIS — F419 Anxiety disorder, unspecified: Secondary | ICD-10-CM | POA: Diagnosis not present

## 2022-11-28 NOTE — Telephone Encounter (Signed)
Patient wife called stating that patient had SI last week. Per pt wife,   Staff called patient wife to see if she could come into office for a backup plan for just incase patient decides to be SI/HI. Per pt wife, patient is Not violent he's just numb. Per pt wife, pt is now responding positive to her now. Per pt wife, patient is Graving about his mom and because she is his rock and he feels like he just lost his rock and he's just tapped out and don't know how to pull himself out the situation. Per pt wife, patient is not agitated or anger and she knows her husband and just concern about his own personal safety. He'll listen to [employer]. Over an hour from the house. 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 Not angry at the world and feel defeated.

## 2022-11-28 NOTE — Telephone Encounter (Signed)
Outpatient AD was contacted by [place] stating that the patient's wife called the office to advise that the patient has had suicidal thoughts with plan (take all pain and Xanax pills) and had a gun on his person. The patient was on his way to his 1p appt with counselor, Maye Hides. I advised to contact [facility] to have an officer present in the event the patient does arrive with a weapon. OP [place], Dr. Adele Schilder was contacted and notified of situation. He advised to call [facility] as well to advise of the situation and the wife of the patient to see if she was able to be present as a support person for the patient. Dr. Adele Schilder also advised that the patient be taken the to ED for IVC if there are any indications of SI. The counselor is aware of this and was also asked to keep the door open if the patient is comfortable and they intend on moving forward with sessions.

## 2022-11-28 NOTE — Progress Notes (Signed)
IN PERSON   I connected with [name] on 11/28/22 at 1:00 PM EDT in person and verified that I am speaking with the correct person using two identifiers.   Location: Patient: Office Provider: Office   I discussed the limitations of evaluation and management by telemedicine and the availability of in person appointments. The patient expressed understanding and agreed to proceed.   THERAPIST PROGRESS NOTE   Session Time: 1:00 PM-1:55 PM   Participation Level: Active   Behavioral Response: CasualAlertDepressed   Type of Therapy: Individual Therapy   Treatment Goals addressed: Coping   Interventions: CBT and Strength-based   Summary: [name]  is a 62 y.o. male who presents with PTSD/Depression/Anxiety . The OPT therapist worked with the patient for his ongoing OPT treatment as a return patient. The OPT therapist utilized Motivational Interviewing to assist in creating therapeutic repore. The patient in the session was engaged and work in collaboration giving feedback about his triggers and symptoms over the past few weeks. The patient spoke about going into a state of crisis over the holidays triggered by a combination of things including not getting to see his daughter and son for Christmas , conflict with step-siblings, relationship conflict, and stress from caregiving for his Mother. The OPT therapist utilized Cognitive Behavioral Therapy through cognitive restructuring as well as worked with the patient on reviewing current stressors his use of coping strategies to manage stress . The patient noted his crisis peaked at thoughts of self harm including OD to point of suicide. The OPT therapist provided psycho-education throughout the session. The patient contracted for safety and worked with the [place] therapist on a crisis plan including if needed checking into in-patient for self harm thoughts.   Suicidal/Homicidal: Nowithout intent/plan   Therapist Response: The OPT therapist  worked with the patient for the patients scheduled session. The patient was engaged in his session and gave feedback in relation to triggers, symptoms, and behavior responses over the past few weeks. The patient spoke about the changes since his last session being overwhelmed triggering the patient into a state of crisis. The OPT therapist worked with the patient utilizing an in session Cognitive Behavioral Therapy exercise. The OPT therapist worked with the patient on maintaining barriers, implementing coping, and continuing to utilize communication with his girlfriend as well as other identified protective factors.The patient agreed to continue to implement reviewed coping skills.The OPT therapist worked with the patient on continuing self check ins and self care.. The OPT therapist worked with the patient on exercise in my control vs out of my control during the session. The patient noted, " I was at the end of my rope and was having thoughts of driving up to [facility] and just taking a bunch of pills and ending things but my girlfriend talked me down and I am glad she did cause I do not fel that way today". The patient contracted for safety, worked on a safety plan during session, and verbalized no current S/I or H/I .Te OPT therapist worked with the patient on management of changes for the identified stressors.The OPT therapist will continue treatment work with the patient in his next scheduled session.    Plan: Return again in 2/3 weeks.   Diagnosis:         Axis I: PTSD/Depression/Anxiety    Axis II: No diagnosis   Patient Follow- 2/3 weeks     Collaboration of Care: No additional collaboration of care for this session.  I discussed the assessment and treatment plan with the patient. The patient was provided an opportunity to ask questions and all were answered. The patient agreed with the plan and demonstrated an understanding of the instructions.   The patient was advised to  call back or seek an in-person evaluation if the symptoms worsen or if the condition fails to improve as anticipated.   I provided 55 minutes of face-to-face time during this encounter.   Maye Hides, LCSW   11/28/2022

## 2022-12-17 ENCOUNTER — Ambulatory Visit (INDEPENDENT_AMBULATORY_CARE_PROVIDER_SITE_OTHER): Payer: Medicare Other | Admitting: Clinical

## 2022-12-17 DIAGNOSIS — F419 Anxiety disorder, unspecified: Secondary | ICD-10-CM

## 2022-12-17 DIAGNOSIS — F4310 Post-traumatic stress disorder, unspecified: Secondary | ICD-10-CM

## 2022-12-17 DIAGNOSIS — F331 Major depressive disorder, recurrent, moderate: Secondary | ICD-10-CM | POA: Diagnosis not present

## 2022-12-17 NOTE — Progress Notes (Signed)
IN PERSON   I connected with [name] on 12/17/22 at 1:00 PM EDT in person and verified that I am speaking with the correct person using two identifiers.   Location: Patient: Office Provider: Office   I discussed the limitations of evaluation and management by telemedicine and the availability of in person appointments. The patient expressed understanding and agreed to proceed.   THERAPIST PROGRESS NOTE   Session Time: 1:00 PM-1:45 PM   Participation Level: Active   Behavioral Response: CasualAlertDepressed   Type of Therapy: Individual Therapy   Treatment Goals addressed: Coping   Interventions: CBT and Strength-based   Summary: [name]  is a 62 y.o. male who presents with PTSD/Depression/Anxiety . The OPT therapist worked with the patient for his ongoing OPT treatment as a return patient. The OPT therapist utilized Motivational Interviewing to assist in creating therapeutic repore. The patient in the session was engaged and work in collaboration giving feedback about his triggers and symptoms over the past few weeks. The patient spoke about being in a better place after limiting his interactions with his siblings who have been a consistent trigger for the patient. The OPT therapist utilized Cognitive Behavioral Therapy through cognitive restructuring as well as worked with the patient on reviewing current stressors his use of coping strategies to manage stress . The patient noted his intent to refocus his energy into relationships he wants to make closer with his kids and his partner. The OPT therapist provided psycho-education throughout the session. The patient  continued to contract for safety..   Suicidal/Homicidal: Nowithout intent/plan   Therapist Response: The OPT therapist worked with the patient for the patients scheduled session. The patient was engaged in his session and gave feedback in relation to triggers, symptoms, and behavior responses over the past few  weeks. The patient spoke about the changes since his last session working to manage his triggers and refocus his energy and feeling a sense of relief. The OPT therapist worked with the patient on maintaining barriers, implementing coping, and continuing to utilize communication with his girlfriend as well as other identified protective factors.The patient agreed to continue to implement reviewed coping skills.The OPT therapist worked with the patient on continuing self check ins and self care.. The OPT therapist worked with the patient on exercise in my control vs out of my control during the session. The patient noted, " I decided to just stop trying to have a relationship with my siblings they have been a big trigger with me and after trying over and over I realized Its better for me to just move on". The patient contracted for safety, The patient spoke about his refocusing on his time with his kids and partner.The OPT therapist will continue treatment work with the patient in his next scheduled session.    Plan: Return again in 2/3 weeks.   Diagnosis:         Axis I: PTSD/Depression/Anxiety    Axis II: No diagnosis   Patient Follow- 2/3 weeks     Collaboration of Care: No additional collaboration of care for this session.     I discussed the assessment and treatment plan with the patient. The patient was provided an opportunity to ask questions and all were answered. The patient agreed with the plan and demonstrated an understanding of the instructions.   The patient was advised to call back or seek an in-person evaluation if the symptoms worsen or if the condition fails to improve as anticipated.  I provided 45 minutes of face-to-face time during this encounter.   Maye Hides, LCSW   12/17/2022

## 2022-12-20 ENCOUNTER — Emergency Department (EMERGENCY_DEPARTMENT_HOSPITAL)
Admission: EM | Admit: 2022-12-20 | Discharge: 2022-12-21 | Disposition: A | Discharge status: Other Acute Inpatient Hospital | Payer: Medicare Other | Source: Home / Self Care

## 2022-12-20 ENCOUNTER — Other Ambulatory Visit: Payer: Self-pay

## 2022-12-20 ENCOUNTER — Encounter (HOSPITAL_COMMUNITY): Payer: Self-pay | Admitting: *Deleted

## 2022-12-20 DIAGNOSIS — I10 Essential (primary) hypertension: Secondary | ICD-10-CM | POA: Insufficient documentation

## 2022-12-20 DIAGNOSIS — Z1152 Encounter for screening for COVID-19: Secondary | ICD-10-CM | POA: Insufficient documentation

## 2022-12-20 DIAGNOSIS — F418 Other specified anxiety disorders: Secondary | ICD-10-CM | POA: Insufficient documentation

## 2022-12-20 DIAGNOSIS — I2510 Atherosclerotic heart disease of native coronary artery without angina pectoris: Secondary | ICD-10-CM | POA: Insufficient documentation

## 2022-12-20 DIAGNOSIS — Z7982 Long term (current) use of aspirin: Secondary | ICD-10-CM | POA: Insufficient documentation

## 2022-12-20 DIAGNOSIS — R45851 Suicidal ideations: Secondary | ICD-10-CM

## 2022-12-20 DIAGNOSIS — Z79899 Other long term (current) drug therapy: Secondary | ICD-10-CM | POA: Insufficient documentation

## 2022-12-20 DIAGNOSIS — F332 Major depressive disorder, recurrent severe without psychotic features: Secondary | ICD-10-CM | POA: Diagnosis not present

## 2022-12-20 HISTORY — DX: Post-traumatic stress disorder, unspecified: F43.10

## 2022-12-20 LAB — COMPREHENSIVE METABOLIC PANEL
ALT: 46 U/L — ABNORMAL HIGH (ref 0–44)
AST: 43 U/L — ABNORMAL HIGH (ref 15–41)
Albumin: 4.1 g/dL (ref 3.5–5.0)
Alkaline Phosphatase: 78 U/L (ref 38–126)
Anion gap: 10 (ref 5–15)
BUN: 13 mg/dL (ref 8–23)
CO2: 27 mmol/L (ref 22–32)
Calcium: 9.2 mg/dL (ref 8.9–10.3)
Chloride: 99 mmol/L (ref 98–111)
Creatinine, Ser: 0.89 mg/dL (ref 0.61–1.24)
GFR, Estimated: >60 mL/min (ref >60)
Glucose, Bld: 101 mg/dL — ABNORMAL HIGH (ref 70–99)
Potassium: 3.8 mmol/L (ref 3.5–5.1)
Sodium: 136 mmol/L (ref 135–145)
Total Bilirubin: 0.9 mg/dL (ref 0.3–1.2)
Total Protein: 8 g/dL (ref 6.5–8.1)

## 2022-12-20 LAB — CBC WITH DIFFERENTIAL/PLATELET
Abs Immature Granulocytes: 0.03 10*3/uL (ref 0.00–0.07)
Basophils Absolute: 0.1 10*3/uL (ref 0.0–0.1)
Basophils Relative: 1 %
Eosinophils Absolute: 0.1 10*3/uL (ref 0.0–0.5)
Eosinophils Relative: 1 %
HCT: 53 % — ABNORMAL HIGH (ref 39.0–52.0)
Hemoglobin: 17.3 g/dL — ABNORMAL HIGH (ref 13.0–17.0)
Immature Granulocytes: 0 %
Lymphocytes Relative: 19 %
Lymphs Abs: 1.8 10*3/uL (ref 0.7–4.0)
MCH: 28.8 pg (ref 26.0–34.0)
MCHC: 32.6 g/dL (ref 30.0–36.0)
MCV: 88.3 fL (ref 80.0–100.0)
Monocytes Absolute: 0.8 10*3/uL (ref 0.1–1.0)
Monocytes Relative: 8 %
Neutro Abs: 6.7 10*3/uL (ref 1.7–7.7)
Neutrophils Relative %: 71 %
Platelets: 210 10*3/uL (ref 150–400)
RBC: 6 MIL/uL — ABNORMAL HIGH (ref 4.22–5.81)
RDW: 12.6 % (ref 11.5–15.5)
WBC: 9.5 10*3/uL (ref 4.0–10.5)
nRBC: 0 % (ref 0.0–0.2)

## 2022-12-20 LAB — RAPID URINE DRUG SCREEN, HOSP PERFORMED
Amphetamines: NOT DETECTED
Barbiturates: NOT DETECTED
Benzodiazepines: NOT DETECTED
Cocaine: NOT DETECTED
Opiates: NOT DETECTED
Tetrahydrocannabinol: NOT DETECTED

## 2022-12-20 LAB — ETHANOL: Alcohol, Ethyl (B): <10 mg/dL (ref <10)

## 2022-12-20 MED ORDER — BUSPIRONE HCL 5 MG PO TABS
7.5000 mg | ORAL_TABLET | Freq: Three times a day (TID) | ORAL | Status: DC
  Filled 2022-12-20: qty 2

## 2022-12-20 MED ORDER — LOSARTAN POTASSIUM 25 MG PO TABS
100.0000 mg | ORAL_TABLET | Freq: Every day | ORAL | Status: DC
  Administered 2022-12-21: 100 mg via ORAL
  Filled 2022-12-20: qty 4

## 2022-12-20 MED ORDER — DULOXETINE HCL 30 MG PO CPEP
90.0000 mg | ORAL_CAPSULE | Freq: Every day | ORAL | Status: DC
  Administered 2022-12-21: 90 mg via ORAL
  Filled 2022-12-20: qty 3

## 2022-12-20 MED ORDER — EZETIMIBE 10 MG PO TABS
10.0000 mg | ORAL_TABLET | Freq: Every day | ORAL | Status: DC
  Administered 2022-12-21: 10 mg via ORAL
  Filled 2022-12-20: qty 1

## 2022-12-20 MED ORDER — ACETAMINOPHEN 500 MG PO TABS
500.0000 mg | ORAL_TABLET | ORAL | Status: DC | PRN
  Administered 2022-12-20 – 2022-12-21 (×2): 500 mg via ORAL
  Filled 2022-12-20 (×2): qty 1

## 2022-12-20 MED ORDER — ASPIRIN 325 MG PO TABS
325.0000 mg | ORAL_TABLET | Freq: Every day | ORAL | Status: DC
  Administered 2022-12-21: 325 mg via ORAL
  Filled 2022-12-20: qty 1

## 2022-12-20 MED ORDER — NICOTINE 14 MG/24HR TD PT24
14.0000 mg | MEDICATED_PATCH | Freq: Every day | TRANSDERMAL | Status: DC
  Filled 2022-12-20: qty 1

## 2022-12-20 MED ORDER — ATORVASTATIN CALCIUM 40 MG PO TABS
80.0000 mg | ORAL_TABLET | Freq: Every morning | ORAL | Status: DC
  Administered 2022-12-21: 80 mg via ORAL
  Filled 2022-12-20: qty 2

## 2022-12-20 MED ORDER — LORAZEPAM 2 MG/ML IJ SOLN: 1.0000 mg | INTRAMUSCULAR | Status: DC | PRN
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
## 2022-12-20 NOTE — ED Notes (Signed)
Ambulatory to restroom
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
## 2022-12-20 NOTE — BH Assessment (Addendum)
@  2050, requested patient's nurse Lonn Georgia, RN) to place the TTS machine in patient's room for his initial TTS assessment.

## 2022-12-21 ENCOUNTER — Inpatient Hospital Stay (HOSPITAL_COMMUNITY)
Admission: AD | Admit: 2022-12-21 | Discharge: 2022-12-26 | DRG: 885 | Disposition: A | Discharge status: Home / Self Care | Payer: Medicare Other | Source: Intra-hospital | Attending: Psychiatry | Admitting: Psychiatry

## 2022-12-21 ENCOUNTER — Encounter (HOSPITAL_COMMUNITY): Payer: Self-pay | Admitting: Psychiatry

## 2022-12-21 DIAGNOSIS — Z8249 Family history of ischemic heart disease and other diseases of the circulatory system: Secondary | ICD-10-CM | POA: Diagnosis not present

## 2022-12-21 DIAGNOSIS — K760 Fatty (change of) liver, not elsewhere classified: Secondary | ICD-10-CM | POA: Diagnosis present

## 2022-12-21 DIAGNOSIS — Z801 Family history of malignant neoplasm of trachea, bronchus and lung: Secondary | ICD-10-CM

## 2022-12-21 DIAGNOSIS — F332 Major depressive disorder, recurrent severe without psychotic features: Principal | ICD-10-CM | POA: Insufficient documentation

## 2022-12-21 DIAGNOSIS — Z87442 Personal history of urinary calculi: Secondary | ICD-10-CM | POA: Diagnosis not present

## 2022-12-21 DIAGNOSIS — Z634 Disappearance and death of family member: Secondary | ICD-10-CM | POA: Diagnosis not present

## 2022-12-21 DIAGNOSIS — F411 Generalized anxiety disorder: Secondary | ICD-10-CM | POA: Diagnosis present

## 2022-12-21 DIAGNOSIS — Z62810 Personal history of physical and sexual abuse in childhood: Secondary | ICD-10-CM

## 2022-12-21 DIAGNOSIS — E7800 Pure hypercholesterolemia, unspecified: Secondary | ICD-10-CM | POA: Diagnosis present

## 2022-12-21 DIAGNOSIS — E119 Type 2 diabetes mellitus without complications: Secondary | ICD-10-CM | POA: Diagnosis present

## 2022-12-21 DIAGNOSIS — G4700 Insomnia, unspecified: Secondary | ICD-10-CM | POA: Diagnosis present

## 2022-12-21 DIAGNOSIS — Z1152 Encounter for screening for COVID-19: Secondary | ICD-10-CM

## 2022-12-21 DIAGNOSIS — Z9889 Other specified postprocedural states: Secondary | ICD-10-CM

## 2022-12-21 DIAGNOSIS — Z820 Family history of epilepsy and other diseases of the nervous system: Secondary | ICD-10-CM | POA: Diagnosis not present

## 2022-12-21 DIAGNOSIS — R4589 Other symptoms and signs involving emotional state: Secondary | ICD-10-CM | POA: Diagnosis present

## 2022-12-21 DIAGNOSIS — R45851 Suicidal ideations: Secondary | ICD-10-CM | POA: Diagnosis present

## 2022-12-21 DIAGNOSIS — E559 Vitamin D deficiency, unspecified: Secondary | ICD-10-CM | POA: Diagnosis present

## 2022-12-21 DIAGNOSIS — Z83438 Family history of other disorder of lipoprotein metabolism and other lipidemia: Secondary | ICD-10-CM

## 2022-12-21 DIAGNOSIS — Z7982 Long term (current) use of aspirin: Secondary | ICD-10-CM

## 2022-12-21 DIAGNOSIS — F17210 Nicotine dependence, cigarettes, uncomplicated: Secondary | ICD-10-CM | POA: Diagnosis present

## 2022-12-21 DIAGNOSIS — F418 Other specified anxiety disorders: Secondary | ICD-10-CM

## 2022-12-21 DIAGNOSIS — Z79899 Other long term (current) drug therapy: Secondary | ICD-10-CM

## 2022-12-21 DIAGNOSIS — F4310 Post-traumatic stress disorder, unspecified: Secondary | ICD-10-CM | POA: Diagnosis present

## 2022-12-21 DIAGNOSIS — Z8551 Personal history of malignant neoplasm of bladder: Secondary | ICD-10-CM

## 2022-12-21 DIAGNOSIS — I10 Essential (primary) hypertension: Secondary | ICD-10-CM | POA: Diagnosis present

## 2022-12-21 DIAGNOSIS — J439 Emphysema, unspecified: Secondary | ICD-10-CM | POA: Diagnosis present

## 2022-12-21 DIAGNOSIS — Z981 Arthrodesis status: Secondary | ICD-10-CM

## 2022-12-21 DIAGNOSIS — I2510 Atherosclerotic heart disease of native coronary artery without angina pectoris: Secondary | ICD-10-CM | POA: Diagnosis present

## 2022-12-21 LAB — RESP PANEL BY RT-PCR (RSV, FLU A&B, COVID)  RVPGX2
Influenza A by PCR: NEGATIVE
Influenza B by PCR: NEGATIVE
Resp Syncytial Virus by PCR: NEGATIVE
SARS Coronavirus 2 by RT PCR: NEGATIVE

## 2022-12-21 NOTE — ED Notes (Signed)
Sheriff here to transport patient to [facility] at this time. Belongings removed from locker and given to sheriff.

## 2022-12-21 NOTE — Progress Notes (Signed)
Pt was accept to [facility] 12/21/22; Bed Assignment 300-2  Pt meets inpatient criteria per Michaele Offer, [place]  Attending Physician will be Dr. Caswell Corwin  Report can be called to: Adult unit: [phone]  Pt can arrive after Pending Items  Care Team notified: [facility] Scharlene Gloss, RN, Michaele Offer, PMHNP, Leona Singleton, RN, and [address] [place], [place] 12/21/2022 @ 5:58 PM

## 2022-12-21 NOTE — ED Notes (Signed)
IVC paperwork faxed to [facility] @ [phone] & [phone] 

## 2022-12-21 NOTE — Consult Note (Signed)
[place] ED ASSESSMENT   Reason for Consult:  Suicidal Ideation Referring Physician: Dion Saucier, PA-C Patient Identification: [name] MRN:  [id number] ED Chief Complaint: Suicidal intent  Diagnosis:  Principal Problem:   Suicidal intent Active Problems:   Depression with anxiety   ED Assessment Time Calculation: Start Time: 1012 Stop Time: 1052 Total Time in Minutes (Assessment Completion): 40   HPI: Per Triage Note " patient was brought in by [facility] for SI, patient states feeling this way since before Christmas per patient.  Patient states when law enforcement got to patient, patient was sitting in his truck with a gun. Pt denies street drugs or ETOH.    Subjective:  [name], 62 y.o., male patient seen face to face by this provider, consulted with Dr. Dwyane Dee; and chart reviewed on 12/21/22.  On evaluation [name] reports that yesterday he had a lot going on, a lot of buildup of anger and frustration, his stepfather passed away 2 days ago and he does not get along with his step siblings, patient's mother has dementia, and he has 2 children with whom he does not get to spend a lot of time with them because of all this he has been having thoughts of hurting himself.  Patient says that he is divorced but has a girlfriend and the girlfriend has a traumatic brain injury which he tries to be there for her, but his children do not come over and visit him and his ex-wife sent him some hurtful text messages which made him feel like he was not a good father.  Patient says he does not work he is on disability, he denies HI/AVH.  Patient says that when things got to be too much for him yesterday he told his girlfriend he was going out for a ride he went to his old house in which his son, owns and sat in the driveway and was thinking and his gun was located in the backseat per patient.  Patient said his girlfriend called his best friend Nicki Reaper who is a Engineer, structural and shortly after Nicki Reaper  arrived at the officers came.  Patient minimized the situation and felt it was not a big deal stating "I hold things in all the time, this is the first time I had my gun".  The provider discussed with him risk factors and increased suicide rates.  Patient says he sees a counselor only once a month but wants to increase how often he sees him, says he has been seeing this counselor off and on for a few years but lately just for a few months. Patient says he thinks he has been diagnosed with PTSD, anxiety, and depression. His PCP prescribed him Cymbalta and BuSpar, he says that she use to prescribe him Xanax and he felt much better when he was taking that with his Cymbalta, he does not like the Buspar. She has not prescribed him Xanax in about a year also because he takes hydrocodone for back pain, he says he wants to be on Xanax, provider attempted to educate patient on side effects of Xanax and also when it is given with hydrocodone. Patient has never been admitted to an inpatient facility per patient. Appetite and sleep are fair, due to anxiety and depression.    During evaluation [name] is siting on his hospital gurney in no acute distress. He is alert, oriented x 4, calm, cooperative and attentive. His mood is sad with flat affect. He has normal  speech, and behavior. Objectively there is no evidence of psychosis/mania or delusional thinking. Patient is able to converse coherently, goal directed thoughts, no distractibility, or pre-occupation. He denies homicidal ideation, psychosis, and paranoia.  Continues to have suicidal thoughts. Patient is minimizing what happened, and feels like if he just waits to talk with his counselor he will be ok.   Spoke with patient's best friend Nicki Reaper [phone] ) with patient permission, who is a police office in [place] and he states that he has known patient for about 8-10 yrs and he feels that patient needs some help, he says that things have snow-balled on  patient and this is the worst I have seen patient. Nicki Reaper states that patient daughter told him this is not the first time patient did something like this. Nicki Reaper also stated that he took the gun out of patient truck and he will hold on to it. Nicki Reaper feels that patient may be a danger to himself right now, if he does not get the proper help and learn coping skills.    Past Psychiatric History: Depression, anxiety, PTSD  Risk to Self or Others: Is the patient at risk to self? Yes Has the patient been a risk to self in the past 6 months? No Has the patient been a risk to self within the distant past? No Is the patient a risk to others? No Has the patient been a risk to others in the past 6 months? No Has the patient been a risk to others within the distant past? No  [place] Scale:  [place] ED from 12/20/2022 in [facility] Emergency Department at [place] High Risk       AIMS:  , , ,  ,   ASAM: ASAM Multidimensional Assessment Summary DImension 1:  Acute Intoxication and/or Withdrawal Potential Severity Rating: None Dimension 3:  Emotional, behavioral or cognitive (EBC) conditions and complications severity rating: None Dimension 4:  Readiness to Change Severity Rating: None Dimension 5:  Relapse, continued use, or continued problem potential severity rating: None Dimension 6:  Recovery/living environment severity rating: None  Substance Abuse:  Alcohol / Drug Use Pain Medications: See MAR Prescriptions: See MAR Over the Counter: See MAR History of alcohol / drug use?: No history of alcohol / drug abuse Longest period of sobriety (when/how long): NA  Past Medical History:  Past Medical History:  Diagnosis Date   Acute pancreatitis    Anxiety    Atrial flutter ([place]) 09/30/2013   Benign essential hypertension    per pt denies ,  but is documented in epic by his pcp   Bladder cancer [facility]) urologist-- dr bell   dx 08/ 2019  s/p  TURBT    Bladder tumor    CAD (coronary artery disease)    last cardiac cath 08-12-2014  mild non-obstructive CAD ,  normal LVF   DDD (degenerative disc disease), lumbar    Depression    Diabetes mellitus type 2, diet-controlled ([place])    Fatty liver    First degree heart block    Headache(784.0)    History of acute pancreatitis 02/2015   History of kidney stones    History of syncope    Hyperlipidemia    Intermediate coronary syndrome (HCC)    Intermittent palpitations    takes bystolic   Obesity    Pancreatic divisum 2016   PTSD (post-traumatic stress disorder)     Past Surgical History:  Procedure Laterality Date  CARDIAC CATHETERIZATION  10-08-2002;  03-15-2006   mild non-obstructive CAD,  normal LVF   ENTEROSCOPY N/A 03/20/2015   Procedure: ENTEROSCOPY;  Surgeon: Jerene Bears, MD;  Location: [facility] ENDOSCOPY;  Service: Endoscopy;  Laterality: N/A;   ERCP  04/2015   stone in pancreatic duct   LEFT HEART CATHETERIZATION WITH CORONARY ANGIOGRAM N/A 08/12/2014   Procedure: LEFT HEART CATHETERIZATION WITH CORONARY ANGIOGRAM;  Surgeon: Burnell Blanks, MD;  Location: [facility] CATH LAB;  Service: Cardiovascular;  Laterality: N/A;   POSTERIOR LUMBAR FUSION  2010   L3 -- 5   TONSILLECTOMY  1979  age 54   TRANSURETHRAL RESECTION OF BLADDER TUMOR WITH MITOMYCIN-C N/A 07/23/2018   Procedure: TRANSURETHRAL RESECTION OF BLADDER TUMOR WITH GEMCITABINE;  Surgeon: Lucas Mallow, MD;  Location: [facility] ORS;  Service: Urology;  Laterality: N/A;   TRANSURETHRAL RESECTION OF BLADDER TUMOR WITH MITOMYCIN-C N/A 12/19/2018   Procedure: TRANSURETHRAL RESECTION OF BLADDER TUMOR WITH GEMCITABINE;  Surgeon: Lucas Mallow, MD;  Location: [facility];  Service: Urology;  Laterality: N/A;   Family History:  Family History  Problem Relation Age of Onset   Heart attack Father        father died of heart attach in his 47s   Hyperlipidemia Father    Early death Father    Lung cancer Mother     Hyperlipidemia Mother    Hyperlipidemia Brother    Alzheimer's disease Maternal Grandmother    Colon cancer Neg Hx    Colon polyps Neg Hx    Esophageal cancer Neg Hx    Rectal cancer Neg Hx    Stomach cancer Neg Hx      Social History:  Social History   Substance and Sexual Activity  Alcohol Use Yes   Alcohol/week: 0.0 standard drinks of alcohol   Comment: socially     Social History   Substance and Sexual Activity  Drug Use No    Social History   Socioeconomic History   Marital status: Soil scientist    Spouse name: Not on file   Number of children: 2   Years of education: Not on file   Highest education level: Not on file  Occupational History   Not on file  Tobacco Use   Smoking status: Every Day    Packs/day: 1.50    Years: 40.00    Total pack years: 60.00    Types: Cigarettes    Last attempt to quit: 11/26/2017    Years since quitting: 5.0   Smokeless tobacco: Former    Types: Snuff    Quit date: 07/27/2008  Vaping Use   Vaping Use: Never used  Substance and Sexual Activity   Alcohol use: Yes    Alcohol/week: 0.0 standard drinks of alcohol    Comment: socially   Drug use: No   Sexual activity: Never  Other Topics Concern   Not on file  Social History Narrative   ** Merged History Encounter **       Social Determinants of Health   Financial Resource Strain: Medium Risk (01/31/2022)   Overall Financial Resource Strain (CARDIA)    Difficulty of Paying Living Expenses: Somewhat hard  Food Insecurity: Food Insecurity Present (01/31/2022)   Hunger Vital Sign    Worried About Running Out of Food in the Last Year: Sometimes true    Ran Out of Food in the Last Year: Never true  Transportation Needs: No Transportation Needs (01/31/2022)   PRAPARE - Transportation  Lack of Transportation (Medical): No    Lack of Transportation (Non-Medical): No  Physical Activity: Inactive (01/31/2022)   Exercise Vital Sign    Days of Exercise per Week: 0 days    Minutes  of Exercise per Session: 0 min  Stress: Stress Concern Present (01/31/2022)   [place]    Feeling of Stress : To some extent  Social Connections: Moderately Isolated (01/31/2022)   Social Connection and Isolation Panel [NHANES]    Frequency of Communication with Friends and Family: Three times a week    Frequency of Social Gatherings with Friends and Family: Once a week    Attends Religious Services: 1 to 4 times per year    Active Member of [employer] or Organizations: No    Attends Archivist Meetings: Never    Marital Status: Divorced      Allergies:  No Known Allergies  Labs:  Results for orders placed or performed during the hospital encounter of 12/20/22 (from the past 48 hour(s))  Comprehensive metabolic panel     Status: Abnormal   Collection Time: 12/20/22  6:08 PM  Result Value Ref Range   Sodium 136 135 - 145 mmol/L   Potassium 3.8 3.5 - 5.1 mmol/L   Chloride 99 98 - 111 mmol/L   CO2 27 22 - 32 mmol/L   Glucose, Bld 101 (H) 70 - 99 mg/dL    Comment: Glucose reference range applies only to samples taken after fasting for at least 8 hours.   BUN 13 8 - 23 mg/dL   Creatinine, Ser 0.89 0.61 - 1.24 mg/dL   Calcium 9.2 8.9 - 10.3 mg/dL   Total Protein 8.0 6.5 - 8.1 g/dL   Albumin 4.1 3.5 - 5.0 g/dL   AST 43 (H) 15 - 41 U/L   ALT 46 (H) 0 - 44 U/L   Alkaline Phosphatase 78 38 - 126 U/L   Total Bilirubin 0.9 0.3 - 1.2 mg/dL   GFR, Estimated >60 >60 mL/min    Comment: (NOTE) Calculated using the CKD-EPI Creatinine Equation (2021)    Anion gap 10 5 - 15    Comment: Performed at [facility], [address], [place], [place] [address]  Ethanol     Status: None   Collection Time: 12/20/22  6:08 PM  Result Value Ref Range   Alcohol, Ethyl (B) <10 <10 mg/dL    Comment: (NOTE) Lowest detectable limit for serum alcohol is 10 mg/dL.  For medical purposes only. Performed at [facility], [address] [place], [place] [address]   CBC with Diff     Status: Abnormal   Collection Time: 12/20/22  6:08 PM  Result Value Ref Range   WBC 9.5 4.0 - 10.5 K/uL   RBC 6.00 (H) 4.22 - 5.81 MIL/uL   Hemoglobin 17.3 (H) 13.0 - 17.0 g/dL   HCT 53.0 (H) 39.0 - 52.0 %   MCV 88.3 80.0 - 100.0 fL   MCH 28.8 26.0 - 34.0 pg   MCHC 32.6 30.0 - 36.0 g/dL   RDW 12.6 11.5 - 15.5 %   Platelets 210 150 - 400 K/uL   nRBC 0.0 0.0 - 0.2 %   Neutrophils Relative % 71 %   Neutro Abs 6.7 1.7 - 7.7 K/uL   Lymphocytes Relative 19 %   Lymphs Abs 1.8 0.7 - 4.0 K/uL   Monocytes Relative 8 %   Monocytes Absolute 0.8 0.1 - 1.0 K/uL  Eosinophils Relative 1 %   Eosinophils Absolute 0.1 0.0 - 0.5 K/uL   Basophils Relative 1 %   Basophils Absolute 0.1 0.0 - 0.1 K/uL   Immature Granulocytes 0 %   Abs Immature Granulocytes 0.03 0.00 - 0.07 K/uL    Comment: Performed at [facility], [address], [place], [place] [address]  Urine rapid drug screen (hosp performed)     Status: None   Collection Time: 12/20/22  6:45 PM  Result Value Ref Range   Opiates NONE DETECTED NONE DETECTED   Cocaine NONE DETECTED NONE DETECTED   Benzodiazepines NONE DETECTED NONE DETECTED   Amphetamines NONE DETECTED NONE DETECTED   Tetrahydrocannabinol NONE DETECTED NONE DETECTED   Barbiturates NONE DETECTED NONE DETECTED    Comment: (NOTE) DRUG SCREEN FOR MEDICAL PURPOSES ONLY.  IF CONFIRMATION IS NEEDED FOR ANY PURPOSE, NOTIFY LAB WITHIN 5 DAYS.  LOWEST DETECTABLE LIMITS FOR URINE DRUG SCREEN Drug Class                     Cutoff (ng/mL) Amphetamine and metabolites    1000 Barbiturate and metabolites    200 Benzodiazepine                 200 Opiates and metabolites        300 Cocaine and metabolites        300 THC                            50 Performed at [facility], [address], [place], [place] [address]     Current Facility-Administered Medications  Medication Dose Route Frequency Provider Last Rate Last Admin    acetaminophen (TYLENOL) tablet 500 mg  500 mg Oral Q4H PRN Wilnette Kales, PA   500 mg at 12/20/22 2204   aspirin tablet 325 mg  325 mg Oral Daily Wilnette Kales, [place]   325 mg at 12/21/22 0762   atorvastatin (LIPITOR) tablet 80 mg  80 mg Oral q morning Wilnette Kales, [place]   80 mg at 12/21/22 2633   busPIRone (BUSPAR) tablet 7.5 mg  7.5 mg Oral TID Dion Saucier A, PA       DULoxetine (CYMBALTA) DR capsule 90 mg  90 mg Oral Daily Wilnette Kales, [place]   90 mg at 12/21/22 3545   ezetimibe (ZETIA) tablet 10 mg  10 mg Oral Daily Dion Saucier A, [place]   10 mg at 12/21/22 6256   LORazepam (ATIVAN) injection 1 mg  1 mg Intramuscular Q4H PRN Dion Saucier A, PA       losartan (COZAAR) tablet 100 mg  100 mg Oral Daily Dion Saucier A, PA   100 mg at 12/21/22 3893   nicotine (NICODERM CQ - dosed in mg/24 hours) patch 14 mg  14 mg Transdermal Daily Dion Saucier A, PA       Current Outpatient Medications  Medication Sig Dispense Refill   aspirin 325 MG tablet Take 325 mg by mouth daily.     cetirizine (ZYRTEC) 10 MG tablet Take 1 tablet by mouth daily.     Cholecalciferol 50 MCG (2000 UT) TABS Take by mouth.     DULoxetine (CYMBALTA) 30 MG capsule Take 1 capsule (30 mg total) by mouth daily. Take with '60mg'$  to equal '90mg'$  90 capsule 3   DULoxetine (CYMBALTA) 60 MG capsule Take 1 capsule (60 mg total) by mouth daily. Take with '30mg'$  daily to  equal '90mg'$  90 capsule 3   ezetimibe (ZETIA) 10 MG tablet Take 1 tablet (10 mg total) by mouth daily. 90 tablet 3   HYDROcodone-acetaminophen (NORCO/VICODIN) 5-325 MG tablet Take 1 tablet by mouth every 8 (eight) hours as needed for severe pain. 90 tablet 0   losartan (COZAAR) 100 MG tablet Take 1 tablet (100 mg total) by mouth daily. For high blood pressure. 90 tablet 3   testosterone cypionate (DEPOTESTOSTERONE CYPIONATE) 200 MG/ML injection Inject 5 mLs into the muscle every 7 (seven) days.     atorvastatin (LIPITOR) 80 MG tablet Take 1 tablet (80 mg  total) by mouth every morning. (Patient taking differently: Take 80 mg by mouth every evening.) 90 tablet 3   busPIRone (BUSPAR) 7.5 MG tablet Take 1 tablet (7.5 mg total) by mouth 3 (three) times daily. For anxiety (Patient not taking: Reported on 12/20/2022) 90 tablet 3    Musculoskeletal: Strength & Muscle Tone: within normal limits Gait & Station: normal Patient leans: N/A   Psychiatric Specialty Exam: Presentation  General Appearance:  Appropriate for Environment  Eye Contact: Good  Speech: Clear and Coherent  Speech Volume: Normal  Handedness:No data recorded  Mood and Affect  Mood: Depressed  Affect: Appropriate; Depressed   Thought Process  Thought Processes: Coherent  Descriptions of Associations:Intact  Orientation:Full (Time, Place and Person)  Thought Content:WDL  History of Schizophrenia/Schizoaffective disorder:No  Duration of Psychotic Symptoms:No data recorded Hallucinations:Hallucinations: None  Ideas of Reference:None  Suicidal Thoughts:Suicidal Thoughts: Yes, Passive SI Passive Intent and/or Plan: With Intent  Homicidal Thoughts:Homicidal Thoughts: No   Sensorium  Memory: Immediate Good; Remote Good  Judgment: Fair  Insight: Fair   Executive Functions  Concentration: Good  Attention Span: Good  Recall: Good  Fund of Knowledge: Good  Language: Good   Psychomotor Activity  Psychomotor Activity: Psychomotor Activity: Normal   Assets  Assets: Communication Skills; Financial Resources/Insurance; Housing; Intimacy; Social Support    Sleep  Sleep: Sleep: Fair   Physical Exam: Physical Exam Vitals and nursing note reviewed.  Eyes:     Pupils: Pupils are equal, round, and reactive to light.  Cardiovascular:     Rate and Rhythm: Normal rate.  Pulmonary:     Effort: Pulmonary effort is normal.  Musculoskeletal:        General: Normal range of motion.  Neurological:     Mental Status: He is alert.   Psychiatric:        Attention and Perception: Attention normal.        Mood and Affect: Mood is depressed.        Speech: Speech normal.        Behavior: Behavior is cooperative.        Thought Content: Thought content includes suicidal ideation.        Cognition and Memory: Cognition normal.        Judgment: Judgment is inappropriate.    Review of Systems  Constitutional: Negative.   Respiratory: Negative.    Genitourinary: Negative.   Psychiatric/Behavioral:  Positive for depression and suicidal ideas.    Blood pressure 121/83, pulse 70, temperature 98.6 F (37 C), resp. rate 17, height '5\' 11"'$  (1.803 m), weight 99.8 kg, SpO2 93 %. Body mass index is 30.68 kg/m.  Medical Decision Making: Recommend inpatient psychiatric facility. Patient continues to have SI thoughts. EDP restarted Buspar 7.5 mg PO TID for anxiety Restarted Cymbalta 90 mg daily for depression Ativan 1 mg IM Q 4 hrs for severe anxiety PRN  Disposition: Recommend psychiatric Inpatient admission when medically cleared.  Michaele Offer, PMHNP 12/21/2022 12:30 PM

## 2022-12-22 ENCOUNTER — Encounter (HOSPITAL_COMMUNITY): Payer: Self-pay | Admitting: Psychiatry

## 2022-12-22 ENCOUNTER — Other Ambulatory Visit: Payer: Self-pay

## 2022-12-22 DIAGNOSIS — F332 Major depressive disorder, recurrent severe without psychotic features: Secondary | ICD-10-CM | POA: Diagnosis not present

## 2022-12-22 DIAGNOSIS — R4589 Other symptoms and signs involving emotional state: Secondary | ICD-10-CM | POA: Diagnosis present

## 2022-12-22 LAB — LIPID PANEL
Cholesterol: 116 mg/dL (ref 0–200)
HDL: 34 mg/dL — ABNORMAL LOW (ref >40)
LDL Cholesterol: 61 mg/dL (ref 0–99)
Total CHOL/HDL Ratio: 3.4 RATIO
Triglycerides: 104 mg/dL (ref <150)
VLDL: 21 mg/dL (ref 0–40)

## 2022-12-22 LAB — TSH: TSH: 1.462 u[IU]/mL (ref 0.350–4.500)

## 2022-12-22 LAB — HEMOGLOBIN A1C
Hgb A1c MFr Bld: 6.6 % — ABNORMAL HIGH (ref 4.8–5.6)
Mean Plasma Glucose: 142.72 mg/dL

## 2022-12-22 MED ORDER — ATORVASTATIN CALCIUM 40 MG PO TABS
80.0000 mg | ORAL_TABLET | Freq: Every morning | ORAL | Status: DC
  Administered 2022-12-22 – 2022-12-26 (×5): 80 mg via ORAL
  Filled 2022-12-22 (×6): qty 1
  Filled 2022-12-22: qty 2

## 2022-12-22 MED ORDER — MAGNESIUM HYDROXIDE 400 MG/5ML PO SUSP: 30.0000 mL | Freq: Every day | ORAL | Status: DC | PRN

## 2022-12-22 MED ORDER — BUSPIRONE HCL 15 MG PO TABS
15.0000 mg | ORAL_TABLET | Freq: Three times a day (TID) | ORAL | Status: DC
  Administered 2022-12-22 – 2022-12-26 (×12): 15 mg via ORAL
  Filled 2022-12-22 (×15): qty 1

## 2022-12-22 MED ORDER — EZETIMIBE 10 MG PO TABS
10.0000 mg | ORAL_TABLET | Freq: Every day | ORAL | Status: DC
  Administered 2022-12-22 – 2022-12-26 (×5): 10 mg via ORAL
  Filled 2022-12-22 (×6): qty 1

## 2022-12-22 MED ORDER — HYDROXYZINE HCL 25 MG PO TABS
25.0000 mg | ORAL_TABLET | Freq: Three times a day (TID) | ORAL | Status: DC | PRN
  Administered 2022-12-22: 25 mg via ORAL
  Filled 2022-12-22: qty 1

## 2022-12-22 MED ORDER — TRAZODONE HCL 50 MG PO TABS: 50.0000 mg | ORAL_TABLET | Freq: Every evening | ORAL | Status: DC | PRN

## 2022-12-22 MED ORDER — ASPIRIN 325 MG PO TABS
325.0000 mg | ORAL_TABLET | Freq: Every day | ORAL | Status: DC
  Administered 2022-12-22 – 2022-12-26 (×5): 325 mg via ORAL
  Filled 2022-12-22 (×7): qty 1

## 2022-12-22 MED ORDER — LOSARTAN POTASSIUM 50 MG PO TABS
100.0000 mg | ORAL_TABLET | Freq: Every day | ORAL | Status: DC
  Administered 2022-12-22 – 2022-12-26 (×5): 100 mg via ORAL
  Filled 2022-12-22 (×7): qty 2

## 2022-12-22 MED ORDER — NICOTINE 14 MG/24HR TD PT24
14.0000 mg | MEDICATED_PATCH | Freq: Every day | TRANSDERMAL | Status: DC
  Filled 2022-12-22 (×6): qty 1

## 2022-12-22 MED ORDER — HYDROCODONE-ACETAMINOPHEN 5-325 MG PO TABS: 1.0000 | ORAL_TABLET | Freq: Three times a day (TID) | ORAL | Status: DC | PRN

## 2022-12-22 MED ORDER — BUSPIRONE HCL 7.5 MG PO TABS
7.5000 mg | ORAL_TABLET | Freq: Three times a day (TID) | ORAL | Status: DC
  Administered 2022-12-22: 7.5 mg via ORAL
  Filled 2022-12-22 (×4): qty 1

## 2022-12-22 MED ORDER — DULOXETINE HCL 60 MG PO CPEP
90.0000 mg | ORAL_CAPSULE | Freq: Every day | ORAL | Status: DC
  Administered 2022-12-22 – 2022-12-26 (×5): 90 mg via ORAL
  Filled 2022-12-22 (×6): qty 1

## 2022-12-22 MED ORDER — ALUM & MAG HYDROXIDE-SIMETH 200-200-20 MG/5ML PO SUSP: 30.0000 mL | ORAL | Status: DC | PRN

## 2022-12-22 NOTE — Progress Notes (Signed)
  Admission Note:  Patient is a 62 year old male admitted to  the unit involuntarily from [facility]  for History of anxiety and suicidal ideations. Patient reports he has increased depression since Christmas of 2023. Patient stated " I have a lot of things going on, my mom, three step siblings, my stepfather fell and broke his hip x 2 places and died at [place] 2 days ago, my mother has dementia, my step siblings don't get along with me, they disowned me over 2 years ago."  Patient reports he is divorced, but has a girlfriend whom he is living with at home, patient stated he has 2 children, a daughter and son. Patient stated that his daughter sent him a message stated " I wasn't the father I used to be." That this statement pushed him to the edge. Patient stated "I was going to shot myself with a gun." He stated that his friend took the gun away from him. Patient reports his stressors include his 3 step siblings not getting along over the years, his mother health issues, and his stepfather death. Patient denies SI,HI, and AVH on admission, denies alcohol and drug use.   Patient is alert and oriented x 4, patient presents with anxious and flat affect and mood. Admission plan of care reviewed and consent signed. Skin assessed and personal belongings searched per protocol. Skin intact and dry with tattoos on both upper arms. Patient oriented to the unit, staff, and room. Patient verbalized understanding of the unit rules. Q 15 minutes observations initiated, patient verbally contracts for safety, patient remains safe on the unit and his room.

## 2022-12-22 NOTE — Progress Notes (Signed)
[facility] Group Notes:  (Nursing/MHT/Case Management/Adjunct)  Date:  12/22/2022  Time:  2000  Type of Therapy:   wrap up group  Participation Level:  Active  Participation Quality:  Appropriate, Attentive, Sharing, and Supportive  Affect:  Flat  Cognitive:  Alert  Insight:  Improving  Engagement in Group:  Engaged  Modes of Intervention:  Clarification, Education, and Support  Summary of Progress/Problems: Positive thinking and positive change were discussed.   Shellia Cleverly 12/22/2022, 8:32 PM

## 2022-12-22 NOTE — Group Note (Signed)
Date:  12/22/2022 Time:  3:53 PM  Group Topic/Focus:  Orientation:   The focus of this group is to educate the patient on the purpose and policies of crisis stabilization and provide a format to answer questions about their admission.  The group details unit policies and expectations of patients while admitted.    Participation Level:  Minimal  Participation Quality:  Attentive  Affect:  Appropriate  Cognitive:  Appropriate  Insight: Appropriate  Engagement in Group:  Limited  Modes of Intervention:  Discussion  Additional Comments:     Jerrye Beavers 12/22/2022, 3:53 PM
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
## 2022-12-22 NOTE — [facility] Suicide Risk Assessment (Signed)
Suicide Risk Assessment  Admission Assessment    [facility] Admission Suicide Risk Assessment   Nursing information obtained from:  Patient  Demographic factors:  Male, Access to firearms, Divorced or widowed  Current Mental Status:  Self-harm thoughts, Self-harm behaviors  Loss Factors:  Financial problems / change in socioeconomic status, Loss of significant relationship Historical Factors:  NA  Risk Reduction Factors:  Positive social support, Sense of responsibility to family, Living with another person, especially a relative, Religious beliefs about death  Total Time spent with patient: 1 hour  Principal Problem: Suicidal behavior  Diagnosis:  Principal Problem:   Suicidal behavior  Subjective Data: See H&P  Continued Clinical Symptoms:  Alcohol Use Disorder Identification Test Final Score (AUDIT): 0 The "Alcohol Use Disorders Identification Test", Guidelines for Use in Primary Care, Second Edition.  World Pharmacologist [facility]). Score between 0-7:  no or low risk or alcohol related problems. Score between 8-15:  moderate risk of alcohol related problems. Score between 16-19:  high risk of alcohol related problems. Score 20 or above:  warrants further diagnostic evaluation for alcohol dependence and treatment.  CLINICAL FACTORS:   Severe Anxiety and/or Agitation Depression:   Hopelessness Impulsivity More than one psychiatric diagnosis Previous Psychiatric Diagnoses and Treatments Medical Diagnoses and Treatments/Surgeries  Musculoskeletal: Strength & Muscle Tone: within normal limits Gait & Station: normal Patient leans: N/A  Psychiatric Specialty Exam:  Presentation  General Appearance:  Casual; Fairly Groomed  Eye Contact: Good  Speech: Clear and Coherent; Normal Rate  Speech Volume: Normal  Handedness:Right   Mood and Affect  Mood: Anxious; Depressed  Affect: Congruent; Depressed; Tearful; Flat  Thought Process  Thought Processes: Linear;  Goal Directed; Coherent  Descriptions of Associations:Intact  Orientation:Full (Time, Place and Person)  Thought Content:Logical  History of Schizophrenia/Schizoaffective disorder:No  Duration of Psychotic Symptoms:No data recorded Hallucinations:Hallucinations: None  Ideas of Reference:None  Suicidal Thoughts:Suicidal Thoughts: No SI Passive Intent and/or Plan: Without Intent; Without Plan; Without Means to Carry Out; Without Access to Means  Homicidal Thoughts:Homicidal Thoughts: No  Sensorium  Memory: Immediate Good; Recent Good; Remote Good  Judgment: Good  Insight: Good; Present  Executive Functions  Concentration: Good  Attention Span: Good  Recall: Good  Fund of Knowledge: Good  Language: Good  Psychomotor Activity  Psychomotor Activity: Psychomotor Activity: Normal  Assets  Assets: Communication Skills; Desire for Improvement; Financial Resources/Insurance; Housing; Resilience; Social Support  Sleep  Sleep: Sleep: Good Number of Hours of Sleep: 6.5  Physical Exam: Blood pressure 129/85, pulse 80, temperature 98.9 F (37.2 C), temperature source Oral, resp. rate 18, height '5\' 11"'$  (1.803 m), weight 99.8 kg, SpO2 96 %. Body mass index is 30.68 kg/m.  COGNITIVE FEATURES THAT CONTRIBUTE TO RISK:  Closed-mindedness, Loss of executive function, Polarized thinking, and Thought constriction (tunnel vision)    SUICIDE RISK:   Moderate:  Frequent suicidal ideation with limited intensity, and duration, some specificity in terms of plans, no associated intent, good self-control, limited dysphoria/symptomatology, some risk factors present, and identifiable protective factors, including available and accessible social support.  PLAN OF CARE: See H&P.  I certify that inpatient services furnished can reasonably be expected to improve the patient's condition.   Lindell Spar, NP, pmhnp, fnp-bc 12/22/2022, 11:08 AM

## 2022-12-22 NOTE — Tx Team (Cosign Needed)
Initial Treatment Plan 12/22/2022 1:14 AM [name] [id number]    PATIENT STRESSORS: Marital or family conflict   Traumatic event     PATIENT STRENGTHS: Motivation for treatment/growth  Physical Health  Supportive family/friends    PATIENT IDENTIFIED PROBLEMS: Suicidal ideations  Depression and anxiety  Family conflict (Step siblings)  "My self"  "My attitude"             DISCHARGE CRITERIA:  Improved stabilization in mood, thinking, and/or behavior Reduction of life-threatening or endangering symptoms to within safe limits Safe-care adequate arrangements made Verbal commitment to aftercare and medication compliance  PRELIMINARY DISCHARGE PLAN: Attend aftercare/continuing care group Outpatient therapy Return to previous living arrangement  PATIENT/FAMILY INVOLVEMENT: This treatment plan has been presented to and reviewed with the patient, [name], and/or family member. The patient and family have been given the opportunity to ask questions and make suggestions.  Ernest Pine, RN 12/22/2022, 1:14 AM

## 2022-12-22 NOTE — Plan of Care (Signed)
  Problem: Education: Goal: Knowledge of General Education information will improve Description: Including pain rating scale, medication(s)/side effects and non-pharmacologic comfort measures Outcome: Progressing   Problem: Coping: Goal: Level of anxiety will decrease Outcome: Progressing   Problem: Education: Goal: Ability to make informed decisions regarding treatment will improve Outcome: Progressing   Problem: Self-Concept: Goal: Will verbalize positive feelings about self Outcome: Progressing

## 2022-12-22 NOTE — Plan of Care (Signed)
  Problem: Health Behavior/Discharge Planning: Goal: Ability to manage health-related needs will improve Outcome: Progressing   Problem: Coping: Goal: Level of anxiety will decrease Outcome: Progressing   Problem: Coping: Goal: Coping ability will improve Outcome: Progressing   Problem: Role Relationship: Goal: Will demonstrate positive changes in social behaviors and relationships Outcome: Progressing   Problem: Self-Concept: Goal: Level of anxiety will decrease Outcome: Progressing   Problem: Education: Goal: Mental status will improve Outcome: Progressing

## 2022-12-22 NOTE — [facility] Group Notes (Signed)
.  Psychoeducational Group Note    Date:  12/22/2022 Time:1300-1400    Purpose of Group: . The group focus' on teaching patients on how to identify their needs and their Life Skills:  A group where two lists are made. What people need and what are things that we do that are unhealthy. The lists are developed by the patients and it is explained that we often do the actions that are not healthy to get our list of needs met.  Goal:: to develop the coping skills needed to get their needs met  Participation Level:  did not attend [name]

## 2022-12-22 NOTE — Progress Notes (Signed)
D- Patient alert and oriented. . Denies SI, HI, AVH, and pain. Patient rates anxiety 1/10 and depression 0/10. Patient states that he had a "great" day and that he found group today beneficial.   A- Scheduled medication administered to patient, per MAR. Support and encouragement provided.  Routine safety checks conducted every 15 minutes.  Patient informed to notify staff with problems or concerns.  R- No adverse drug reactions noted. Patient contracts for safety at this time. Patient compliant with medications and treatment plan. Patient receptive, calm, and cooperative. Patient interacts well with others on the unit.  Patient remains safe at this time.

## 2022-12-22 NOTE — [facility] Group Notes (Signed)
Group Focus: affirmation, clarity of thought, and goals/reality orientation Treatment Modality:  Psychoeducation Interventions utilized were assignment, group exercise, and support Purpose: To be able to understand and verbalize the reason for their admission to the hospital. To understand that the medication helps with their chemical imbalance but they also need to work on their choices in life. To be challenged to develop a list of 30 positives about themselves. Also introduce the concept that "feelings" are not reality.  Participation Level:did not attend  [name]

## 2022-12-22 NOTE — Progress Notes (Addendum)
D. Pt presents with an anxious affect, but brightens a little during interactions. Pt has been visible in the milieu, observed attending groups. Per pt's self inventory, pt rated his depression,hopelessness and anxiety a 2/0/2, respectively. Pt currently denies SI/HI and AVH  A. Labs and vitals monitored. Pt compliant with medications. Pt supported emotionally and encouraged to express concerns and ask questions.   R. Pt remains safe with 15 minute checks. Will continue POC.    12/22/22 1100  Psych Admission Type (Psych Patients Only)  Admission Status Involuntary  Psychosocial Assessment  Patient Complaints None  Eye Contact Fair  Facial Expression Anxious  Affect Appropriate to circumstance  Speech Logical/coherent  Interaction Assertive  Motor Activity Other (Comment) (steady gait)  Appearance/Hygiene Unremarkable  Behavior Characteristics Appropriate to situation;Cooperative;Calm  Mood Anxious;Pleasant  Thought Process  Coherency WDL  Content WDL  Delusions None reported or observed  Perception WDL  Hallucination None reported or observed  Judgment Poor  Confusion None  Danger to Self  Current suicidal ideation? Denies  Agreement Not to Harm Self Yes  Description of Agreement verbal contract for safety

## 2022-12-22 NOTE — Group Note (Signed)
LCSW Group Therapy Note   No social work group was held; the following was provided to each patient  in lieu of in-person group:  Healthy vs. 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

## 2022-12-23 DIAGNOSIS — F332 Major depressive disorder, recurrent severe without psychotic features: Secondary | ICD-10-CM | POA: Diagnosis not present

## 2022-12-23 LAB — HEMOGLOBIN A1C
Hgb A1c MFr Bld: 6.9 % — ABNORMAL HIGH (ref 4.8–5.6)
Mean Plasma Glucose: 151.33 mg/dL

## 2022-12-23 LAB — URINALYSIS, COMPLETE (UACMP) WITH MICROSCOPIC
Bacteria, UA: NONE SEEN
Bilirubin Urine: NEGATIVE
Glucose, UA: NEGATIVE mg/dL
Ketones, ur: NEGATIVE mg/dL
Leukocytes,Ua: NEGATIVE
Nitrite: NEGATIVE
Protein, ur: >=300 mg/dL — AB
Specific Gravity, Urine: 1.024 (ref 1.005–1.030)
pH: 5 (ref 5.0–8.0)

## 2022-12-23 LAB — COMPREHENSIVE METABOLIC PANEL
ALT: 43 U/L (ref 0–44)
AST: 38 U/L (ref 15–41)
Albumin: 3.5 g/dL (ref 3.5–5.0)
Alkaline Phosphatase: 58 U/L (ref 38–126)
Anion gap: 10 (ref 5–15)
BUN: 16 mg/dL (ref 8–23)
CO2: 25 mmol/L (ref 22–32)
Calcium: 9.1 mg/dL (ref 8.9–10.3)
Chloride: 103 mmol/L (ref 98–111)
Creatinine, Ser: 1.02 mg/dL (ref 0.61–1.24)
GFR, Estimated: >60 mL/min (ref >60)
Glucose, Bld: 133 mg/dL — ABNORMAL HIGH (ref 70–99)
Potassium: 4.2 mmol/L (ref 3.5–5.1)
Sodium: 138 mmol/L (ref 135–145)
Total Bilirubin: 0.8 mg/dL (ref 0.3–1.2)
Total Protein: 6.5 g/dL (ref 6.5–8.1)

## 2022-12-23 MED ORDER — ARIPIPRAZOLE 5 MG PO TABS
5.0000 mg | ORAL_TABLET | Freq: Every day | ORAL | Status: DC
  Administered 2022-12-23 – 2022-12-24 (×2): 5 mg via ORAL
  Filled 2022-12-23 (×4): qty 1

## 2022-12-23 NOTE — Progress Notes (Signed)
   12/23/22 2094  15 Minute Checks  Location Dayroom  Visual Appearance Calm  Behavior Composed  Sleep (Behavioral Health Patients Only)  Calculate sleep? (Click Yes once per 24 hr at 0600 safety check) Yes  Documented sleep last 24 hours 5.5

## 2022-12-23 NOTE — [facility] Group Notes (Signed)
Adult Psychoeducational Group  Date:  12/23/2022 Time: 1300-1400  Group Topic/Focus: Continuation of the group from Saturday. Looking at the lists that were created and talking about what needs to be done with the homework of 30 positives about themselves.                                     Talking about taking their power back and helping themselves to develop a positive self esteem.      Participation Quality:  Appropriate  Affect:  Appropriate  Cognitive:  Oriented  Insight: Improving  Engagement in Group:  Engaged  Modes of Intervention:  Activity, Discussion, Education, and Support  Additional Comments:  Rates energy as a 9.5/10  Bryson Dames A

## 2022-12-23 NOTE — Progress Notes (Addendum)
D. Pt presents with a flat affect brightens a little during interactions. Pt continues to minimize, voices no complaints, and denies SI/HI and A/VH. Per pt's self inventory, pt rated his depression,hopelessness and anxiety all 0's. Pt's stated goal was, "to continue to keep a positive attitude and feel good about setting boundaries."  Pt has been visible in the milieu, observed attending groups. A. Labs and vitals monitored. Pt given and educated on medications. Pt supported emotionally and encouraged to express concerns and ask questions.   R. Pt remains safe with 15 minute checks. Will continue POC.    12/23/22 1000  Psych Admission Type (Psych Patients Only)  Admission Status Involuntary  Psychosocial Assessment  Patient Complaints None  Eye Contact Fair  Facial Expression Animated  Affect Appropriate to circumstance  Speech Logical/coherent  Interaction Assertive  Motor Activity Other (Comment) (steady gait)  Appearance/Hygiene Unremarkable  Behavior Characteristics Cooperative;Appropriate to situation  Mood Pleasant  Thought Process  Coherency WDL  Content WDL  Delusions None reported or observed  Perception WDL  Hallucination None reported or observed  Judgment Poor  Confusion None  Danger to Self  Current suicidal ideation? Denies  Agreement Not to Harm Self Yes  Description of Agreement  (verbal contract for safety)  Danger to Others  Danger to Others None reported or observed

## 2022-12-23 NOTE — Progress Notes (Signed)
[facility] MD Progress Note  12/23/2022 1:05 PM [name]  MRN:  [id number]  Reason for admission: 62 year old divorced Caucasian male with hx of major depressive disorder, recurrent episodes, anxiety disorder & PTSD. Patient has other chronic medical conditions. He is admitted to the [facility] from the [facility] with complaint of suicidal ideations with plans to use a gun on himself. Chart review indicated was triggered by familial stressors, an aging parent (mother) with dementia, a recent death of step-father with dementia & family discord (patient in disagreement with step-siblings on how to care for their aging demented parents).   Daily notes: [name] is seen. Char reviewed. The chart findings discussed with the treatment team. He presents alert, oriented & aware of situation. He is visible on the unit, attending group sessions. He presents with an improved/reactive affect. He says "I'm doing really well today. My mood is actually great. The group sessions has indeed opened my eyes & the session we just finished this morning was informative & re-assuring. I learnt today that it is okay to set boundaries. I slept well last night. My appetite is good. I'm not having any symptoms of depression or anxiety so far today". [name] is informed/explained that after talking with his girlfriend yesterday & the information she provided, it appears he has been struggling with worsening depressive symptoms for a good while. Patient was also informed that although he did not reveal this information during his initial evaluation yesterday, that this is actually not the first time that he has tried to hurt himself with a gun. That apparently, he had tried the same thing a week or two prior to this admission. [name] replied, "But I did not pull the trigger". The girlfriend also stated that he has been on Cymbalta 90 mg for a while & still remained depressed. Discussed with the patient that his treatment team has decided that  he may benefit from the trial of Abilify 5 mg to augment the Cymbalta in managing his depressive symptoms. Although he felt he does not need this medicine, he is willing to give it a try. He currently denies any SIHI, AVH, delusional thoughts or paranoia. He does not appear to be responding to any internal stimuli. Treyven is started on Abilify 5 mg po daily. Reviewed lab results of 12-22-22: CMP- elevated Gluc level- 133, Hgba1c- 6.9 (pre-diabetic). U/A: Presence of blood (small). Discussed the lab results with patient. Instructed to follow-up with his primary care provider after discharge. Vital signs remain stable.   Principal Problem: Major depressive disorder, recurrent episode, severe ([place])  Total Time spent with patient:  35 minutes  Past Psychiatric History: See H&P  Past Medical History:  Past Medical History:  Diagnosis Date   Acute pancreatitis    Anxiety    Atrial flutter ([place]) 09/30/2013   Benign essential hypertension    per pt denies ,  but is documented in epic by his pcp   Bladder cancer [facility]) urologist-- dr bell   dx 08/ 2019  s/p  TURBT   Bladder tumor    CAD (coronary artery disease)    last cardiac cath 08-12-2014  mild non-obstructive CAD ,  normal LVF   DDD (degenerative disc disease), lumbar    Depression    Diabetes mellitus type 2, diet-controlled ([place])    Fatty liver    First degree heart block    Headache(784.0)    History of acute pancreatitis 02/2015   History of kidney stones  History of syncope    Hyperlipidemia    Intermediate coronary syndrome [facility])    Intermittent palpitations    takes bystolic   Obesity    Pancreatic divisum 2016   PTSD (post-traumatic stress disorder)     Past Surgical History:  Procedure Laterality Date   CARDIAC CATHETERIZATION  10-08-2002;  03-15-2006   mild non-obstructive CAD,  normal LVF   ENTEROSCOPY N/A 03/20/2015   Procedure: ENTEROSCOPY;  Surgeon: Jerene Bears, MD;  Location: [facility];  Service: Endoscopy;   Laterality: N/A;   ERCP  04/2015   stone in pancreatic duct   LEFT HEART CATHETERIZATION WITH CORONARY ANGIOGRAM N/A 08/12/2014   Procedure: LEFT HEART CATHETERIZATION WITH CORONARY ANGIOGRAM;  Surgeon: Burnell Blanks, MD;  Location: [facility] CATH LAB;  Service: Cardiovascular;  Laterality: N/A;   POSTERIOR LUMBAR FUSION  2010   L3 -- 5   TONSILLECTOMY  1979  age 5   TRANSURETHRAL RESECTION OF BLADDER TUMOR WITH MITOMYCIN-C N/A 07/23/2018   Procedure: TRANSURETHRAL RESECTION OF BLADDER TUMOR WITH GEMCITABINE;  Surgeon: Lucas Mallow, MD;  Location: [facility];  Service: Urology;  Laterality: N/A;   TRANSURETHRAL RESECTION OF BLADDER TUMOR WITH MITOMYCIN-C N/A 12/19/2018   Procedure: TRANSURETHRAL RESECTION OF BLADDER TUMOR WITH GEMCITABINE;  Surgeon: Lucas Mallow, MD;  Location: [facility];  Service: Urology;  Laterality: N/A;   Family History:  Family History  Problem Relation Age of Onset   Heart attack Father        father died of heart attach in his 72s   Hyperlipidemia Father    Early death Father    Lung cancer Mother    Hyperlipidemia Mother    Hyperlipidemia Brother    Alzheimer's disease Maternal Grandmother    Colon cancer Neg Hx    Colon polyps Neg Hx    Esophageal cancer Neg Hx    Rectal cancer Neg Hx    Stomach cancer Neg Hx    Family Psychiatric  History: See H&P.  Social History:  Social History   Substance and Sexual Activity  Alcohol Use Not Currently   Comment: socially     Social History   Substance and Sexual Activity  Drug Use No    Social History   Socioeconomic History   Marital status: Divorced    Spouse name: Not on file   Number of children: 2   Years of education: Not on file   Highest education level: Not on file  Occupational History   Not on file  Tobacco Use   Smoking status: Never   Smokeless tobacco: Not on file   Tobacco comments:    Patient indicated interest for nicotine patch.  Vaping Use   Vaping  Use: Some days  Substance and Sexual Activity   Alcohol use: Not Currently    Comment: socially   Drug use: No   Sexual activity: Yes  Other Topics Concern   Not on file  Social History Narrative   ** Merged History Encounter **       Social Determinants of Health   Financial Resource Strain: Medium Risk (01/31/2022)   Overall Financial Resource Strain (CARDIA)    Difficulty of Paying Living Expenses: Somewhat hard  Food Insecurity: Food Insecurity Present (12/21/2022)   Hunger Vital Sign    Worried About Running Out of Food in the Last Year: Sometimes true    Ran Out of Food in the Last Year: Never true  Transportation Needs: No Transportation  Needs (12/21/2022)   PRAPARE - Hydrologist (Medical): No    Lack of Transportation (Non-Medical): No  Physical Activity: Inactive (01/31/2022)   Exercise Vital Sign    Days of Exercise per Week: 0 days    Minutes of Exercise per Session: 0 min  Stress: Stress Concern Present (01/31/2022)   [place]    Feeling of Stress : To some extent  Social Connections: Moderately Isolated (12/21/2022)   Social Connection and Isolation Panel [NHANES]    Frequency of Communication with Friends and Family: More than three times a week    Frequency of Social Gatherings with Friends and Family: Twice a week    Attends Religious Services: 1 to 4 times per year    Active Member of [employer] or Organizations: Patient refused    Attends Archivist Meetings: Never    Marital Status: Divorced   Additional Social History:   Sleep: Good  Appetite:  Good  Current Medications: Current Facility-Administered Medications  Medication Dose Route Frequency Provider Last Rate Last Admin   alum & mag hydroxide-simeth (MAALOX/MYLANTA) 200-200-20 MG/5ML suspension 30 mL  30 mL Oral Q4H PRN Motley-Mangrum, Jadeka A, PMHNP       ARIPiprazole (ABILIFY) tablet 5 mg  5 mg  Oral Daily Shruthi Northrup, Herbert Pun I, NP   5 mg at 12/23/22 1249   aspirin tablet 325 mg  325 mg Oral Daily Motley-Mangrum, Jadeka A, PMHNP   325 mg at 12/23/22 0751   atorvastatin (LIPITOR) tablet 80 mg  80 mg Oral q morning Motley-Mangrum, Jadeka A, PMHNP   80 mg at 12/23/22 0933   busPIRone (BUSPAR) tablet 15 mg  15 mg Oral TID Lindell Spar I, NP   15 mg at 12/23/22 0751   DULoxetine (CYMBALTA) DR capsule 90 mg  90 mg Oral Daily Motley-Mangrum, Jadeka A, PMHNP   90 mg at 12/23/22 0751   ezetimibe (ZETIA) tablet 10 mg  10 mg Oral Daily Motley-Mangrum, Jadeka A, PMHNP   10 mg at 12/23/22 0751   HYDROcodone-acetaminophen (NORCO/VICODIN) 5-325 MG per tablet 1 tablet  1 tablet Oral Q8H PRN Motley-Mangrum, Jadeka A, PMHNP       hydrOXYzine (ATARAX) tablet 25 mg  25 mg Oral TID PRN Motley-Mangrum, Jadeka A, PMHNP   25 mg at 12/22/22 1153   losartan (COZAAR) tablet 100 mg  100 mg Oral Daily Motley-Mangrum, Jadeka A, PMHNP   100 mg at 12/23/22 0751   magnesium hydroxide (MILK OF MAGNESIA) suspension 30 mL  30 mL Oral Daily PRN Motley-Mangrum, Jadeka A, PMHNP       nicotine (NICODERM CQ - dosed in mg/24 hours) patch 14 mg  14 mg Transdermal Daily Motley-Mangrum, Jadeka A, PMHNP       traZODone (DESYREL) tablet 50 mg  50 mg Oral QHS PRN Motley-Mangrum, Jadeka A, PMHNP       Lab Results:  Results for orders placed or performed during the hospital encounter of 12/21/22 (from the past 48 hour(s))  Hemoglobin A1c     Status: Abnormal   Collection Time: 12/22/22  6:42 AM  Result Value Ref Range   Hgb A1c MFr Bld 6.6 (H) 4.8 - 5.6 %    Comment: (NOTE) Pre diabetes:          5.7%-6.4%  Diabetes:              >6.4%  Glycemic control for   <7.0% adults with diabetes  Mean Plasma Glucose 142.72 mg/dL    Comment: Performed at [facility] [address], [place], [place] [address]  Lipid panel     Status: Abnormal   Collection Time: 12/22/22  6:42 AM  Result Value Ref Range   Cholesterol 116 0 - 200 mg/dL    Triglycerides 104 <150 mg/dL   HDL 34 (L) >40 mg/dL   Total CHOL/HDL Ratio 3.4 RATIO   VLDL 21 0 - 40 mg/dL   LDL Cholesterol 61 0 - 99 mg/dL    Comment:        Total Cholesterol/HDL:CHD Risk Coronary Heart Disease Risk Table                     Men   Women  1/2 Average Risk   3.4   3.3  Average Risk       5.0   4.4  2 X Average Risk   9.6   7.1  3 X Average Risk  23.4   11.0        Use the calculated Patient Ratio above and the CHD Risk Table to determine the patient's CHD Risk.        ATP III CLASSIFICATION (LDL):  <100     mg/dL   Optimal  100-129  mg/dL   Near or Above                    Optimal  130-159  mg/dL   Borderline  160-189  mg/dL   High  >190     mg/dL   Very High Performed at [facility] [address], [place], [place] [address]   TSH     Status: None   Collection Time: 12/22/22  6:42 AM  Result Value Ref Range   TSH 1.462 0.350 - 4.500 uIU/mL    Comment: Performed by a 3rd Generation assay with a functional sensitivity of <=0.01 uIU/mL. Performed at [facility], [place] [address], [place], [place] [address]   Urinalysis, Complete w Microscopic -Urine, Clean Catch     Status: Abnormal   Collection Time: 12/22/22  7:12 AM  Result Value Ref Range   Color, Urine YELLOW YELLOW   APPearance CLEAR CLEAR   Specific Gravity, Urine 1.024 1.005 - 1.030   pH 5.0 5.0 - 8.0   Glucose, UA NEGATIVE NEGATIVE mg/dL   Hgb urine dipstick SMALL (A) NEGATIVE   Bilirubin Urine NEGATIVE NEGATIVE   Ketones, ur NEGATIVE NEGATIVE mg/dL   Protein, ur >=300 (A) NEGATIVE mg/dL   Nitrite NEGATIVE NEGATIVE   Leukocytes,Ua NEGATIVE NEGATIVE   RBC / HPF 6-10 0 - 5 RBC/hpf   WBC, UA 0-5 0 - 5 WBC/hpf   Bacteria, UA NONE SEEN NONE SEEN   Squamous Epithelial / HPF 0-5 0 - 5 /HPF   Mucus PRESENT     Comment: Performed at [facility], [place] [address], [place], [place] [address]  Comprehensive metabolic panel     Status:  Abnormal   Collection Time: 12/23/22  6:33 AM  Result Value Ref Range   Sodium 138 135 - 145 mmol/L   Potassium 4.2 3.5 - 5.1 mmol/L   Chloride 103 98 - 111 mmol/L   CO2 25 22 - 32 mmol/L   Glucose, Bld 133 (H) 70 - 99 mg/dL    Comment: Glucose reference range applies only to samples taken after fasting for at least 8 hours.   BUN 16 8 -  23 mg/dL   Creatinine, Ser 1.02 0.61 - 1.24 mg/dL   Calcium 9.1 8.9 - 10.3 mg/dL   Total Protein 6.5 6.5 - 8.1 g/dL   Albumin 3.5 3.5 - 5.0 g/dL   AST 38 15 - 41 U/L   ALT 43 0 - 44 U/L   Alkaline Phosphatase 58 38 - 126 U/L   Total Bilirubin 0.8 0.3 - 1.2 mg/dL   GFR, Estimated >60 >60 mL/min    Comment: (NOTE) Calculated using the CKD-EPI Creatinine Equation (2021)    Anion gap 10 5 - 15    Comment: Performed at [facility], [place] [address] [place], [place] [address]  Hemoglobin A1c     Status: Abnormal   Collection Time: 12/23/22  6:33 AM  Result Value Ref Range   Hgb A1c MFr Bld 6.9 (H) 4.8 - 5.6 %    Comment: (NOTE) Pre diabetes:          5.7%-6.4%  Diabetes:              >6.4%  Glycemic control for   <7.0% adults with diabetes    Mean Plasma Glucose 151.33 mg/dL    Comment: Performed at [facility] [address] [place], [place] [address]   Blood Alcohol level:  Lab Results  Component Value Date   ETH <10 91/47/8295   Metabolic Disorder Labs: Lab Results  Component Value Date   HGBA1C 6.9 (H) 12/23/2022   MPG 151.33 12/23/2022   MPG 142.72 12/22/2022   No results found for: "PROLACTIN" Lab Results  Component Value Date   CHOL 116 12/22/2022   TRIG 104 12/22/2022   HDL 34 (L) 12/22/2022   CHOLHDL 3.4 12/22/2022   VLDL 21 12/22/2022   LDLCALC 61 12/22/2022   LDLCALC 85 06/21/2021   Physical Findings: AIMS:  , ,  ,  ,    CIWA:    COWS:     Musculoskeletal: Strength & Muscle Tone: within normal limits Gait & Station: normal Patient leans: N/A  Psychiatric Specialty  Exam:  Presentation  General Appearance:  Casual; Fairly Groomed  Eye Contact: Good  Speech: Clear and Coherent; Normal Rate  Speech Volume: Normal  Handedness: Right  Mood and Affect  Mood: Anxious; Depressed  Affect: Congruent; Depressed; Tearful; Flat  Thought Process  Thought Processes: Linear; Goal Directed; Coherent  Descriptions of Associations:Intact  Orientation:Full (Time, Place and Person)  Thought Content:Logical  History of Schizophrenia/Schizoaffective disorder:No  Duration of Psychotic Symptoms:No data recorded Hallucinations:Hallucinations: None  Ideas of Reference:None  Suicidal Thoughts:Suicidal Thoughts: No SI Passive Intent and/or Plan: Without Intent; Without Plan; Without Means to Carry Out; Without Access to Means  Homicidal Thoughts:Homicidal Thoughts: No  Sensorium  Memory: Immediate Good; Recent Good; Remote Good  Judgment: Good  Insight: Good; Present  Executive Functions  Concentration: Good  Attention Span: Good  Recall: Good  Fund of Knowledge: Good  Language: Good  Psychomotor Activity  Psychomotor Activity: Psychomotor Activity: Normal  Assets  Assets: Communication Skills; Desire for Improvement; Financial Resources/Insurance; Housing; Resilience; Social Support  Sleep  Sleep: Sleep: Good Number of Hours of Sleep: 6.5  Physical Exam: Physical Exam Vitals and nursing note reviewed.  HENT:     Nose: Nose normal.     Mouth/Throat:     Pharynx: Oropharynx is clear.  Eyes:     Pupils: Pupils are equal, round, and reactive to light.  Cardiovascular:     Rate and Rhythm: Normal rate.  Pulses: Normal pulses.  Pulmonary:     Effort: Pulmonary effort is normal.  Genitourinary:    Comments: Deferred Musculoskeletal:        General: Normal range of motion.     Cervical back: Normal range of motion.  Skin:    General: Skin is warm and dry.  Neurological:     General: No focal deficit  present.     Mental Status: He is alert and oriented to person, place, and time.    Review of Systems  Constitutional:  Negative for chills, fever and malaise/fatigue.  HENT:  Negative for congestion and sore throat.   Eyes:  Negative for blurred vision.  Respiratory:  Negative for cough, shortness of breath and wheezing.   Cardiovascular:  Negative for chest pain and palpitations.  Gastrointestinal:  Negative for abdominal pain, constipation, diarrhea, heartburn, nausea and vomiting.  Genitourinary:  Negative for dysuria, flank pain, frequency, hematuria and urgency.       Hx. Bladder cancer.  Musculoskeletal:  Negative for joint pain and myalgias.  Skin:  Negative for itching and rash.   Blood pressure 119/79, pulse 74, temperature 98.6 F (37 C), temperature source Oral, resp. rate 18, height '5\' 11"'$  (1.803 m), weight 99.8 kg, SpO2 97 %. Body mass index is 30.68 kg/m.  Treatment Plan Summary: Daily contact with patient to assess and evaluate symptoms and progress in treatment and Medication management.   Continue inpatient hospitalization. Will continue today 12/23/2022 plan as below except where it is noted.   Principal/active diagnoses.  Major depressive disorder, recurrent episode, severe ([place]). Generalized anxiety disorder.  Associated symptoms. Suicidal behavior  Plan:  -Initiated Abilify 5 mg po daily as adjunct to Cymbalta for depression. -Continue Duloxetine 90 mg po daily for depression.  -Continue Buspar 15 mg po tid for anxiety.  -Continue Hydroxyzine 25 mg po tid prn for anxiety.  -Continue Trazodone 50 mg po Q bedtime prn for insomnia.   Other medical issues.  -Continue ASA 325 mg po daily for heart health.  -Continue Lipitor 80 mg po Q evenings for hyperlipidemia.  -Continue Zetia 10 mg po daily for high cholesterol.  -Continue Hydrocodone 5-325 mg po Q 8 hours prn for back pain.  -Continue Losartan 100 mg po daily for HTN.    Reviewed lab results: CMP-  elevated Gluc level- 133. Hgba1c: 6.9 (pre-diabetic). U/A: Presence of blood (small).   Other PRNS -Continue Tylenol 650 mg every 6 hours PRN for mild pain -Continue Maalox 30 ml Q 4 hrs PRN for indigestion -Continue MOM 30 ml po Q 6 hrs for constipation   Safety and Monitoring: Voluntary admission to inpatient psychiatric unit for safety, stabilization and treatment Daily contact with patient to assess and evaluate symptoms and progress in treatment Patient's case to be discussed in multi-disciplinary team meeting Observation Level : q15 minute checks Vital signs: q12 hours Precautions: Safety   Discharge Planning: Social work and case management to assist with discharge planning and identification of hospital follow-up needs prior to discharge Estimated LOS: 5-7 days Discharge Concerns: Need to establish a safety plan; Medication compliance and effectiveness Discharge Goals: Return home with outpatient referrals for mental health follow-up including medication management/psychotherapy  Lindell Spar, NP, pmhnp, fnp-bc 12/23/2022, 1:05 PM

## 2022-12-23 NOTE — [facility] Group Notes (Signed)
Adult Psychoeducational Group Note Date:  12/23/2022 Time:  0900-1000 Group Topic/Focus: PROGRESSIVE RELAXATION. A group where deep breathing is taught and tensing and relaxation muscle groups is used. Imagery is used as well.  Pts are asked to imagine 3 pillars that hold them up when they are not able to hold themselves up and to share that with the group.   Participation Level:  Active  Participation Quality:  Appropriate  Affect:  Appropriate  Cognitive:  Approprate  Insight: Improving  Engagement in Group:  Engaged  Modes of Intervention:  deep breathing, Imagery. Discussion  Additional Comments:  Pt rates his energy at a 9/10. States faith, friends and family hold him up.   : [name]

## 2022-12-23 NOTE — Group Note (Signed)
LCSW Group Therapy Note   Group Date: 12/23/2022 Start Time: 1505 End Time: 1145  Type of Therapy and Topic:  Group Therapy:  Communication  Participation Level:  Minimal   Description of Group:    In this group patients will be encouraged to explore how individuals communicate with one another appropriately and inappropriately. Patients will be guided to discuss their thoughts, feelings, and behaviors related to barriers communicating feelings, needs, and stressors. The group will process together ways to execute positive and appropriate communications, with attention given to how one use behavior, tone, and body language to communicate. Patient will be encouraged to reflect on an incident where they were successfully able to communicate and the factors that they believe helped them to communicate. Each patient will be encouraged to identify specific changes they are motivated to make in order to overcome communication barriers with self, peers, authority, and parents. This group will be process-oriented, with patients participating in exploration of their own experiences as well as giving and receiving support and challenging self as well as other group members.  Therapeutic Goals: Patient will identify how people communicate (body language, facial expression, and electronics) Also discuss tone, voice and how these impact what is communicated and how the message is perceived.  Patient will identify feelings (such as fear or worry), thought process and behaviors related to why people internalize feelings rather than express self openly. Patient will identify two changes they are willing to make to overcome communication barriers. Members will then practice through Role Play how to communicate by utilizing psycho-education material (such as I Feel statements and acknowledging feelings rather than displacing on others)   Summary of Patient Progress Patient came to group and introduced himself.  Patient followed along as CSW explained the provided worksheet. Patient did not provide insight but was very attentive based his eye contact and body language, nodding his head often. However, patient was respectful towards his peers and would write things down on his worksheet. Patient remained in group the entire time.     Therapeutic Modalities:   Cognitive Behavioral Therapy Solution Focused Therapy Motivational Interviewing Family Systems Approach   Sherre Lain, Latanya Presser 12/23/2022  1:46 PM

## 2022-12-23 NOTE — Progress Notes (Signed)
The focus of this group is to help patients review their daily goal of treatment and discuss progress on daily workbooks.  Pt attended the evening group and responded to all discussion prompts from the [place]. Pt shared that today was a good day on the unit, the highlight of which was receiving a phone call from a supportive friend.  On the subject of goals for the coming week, Pt mentioned only wanting to discharge home. "I'll work to do whatever I've got to do."  Pt rated his day a 9 out of 10 and his affect was appropriate.

## 2022-12-24 ENCOUNTER — Encounter (HOSPITAL_COMMUNITY): Payer: Self-pay

## 2022-12-24 DIAGNOSIS — R45851 Suicidal ideations: Secondary | ICD-10-CM

## 2022-12-24 DIAGNOSIS — F411 Generalized anxiety disorder: Secondary | ICD-10-CM

## 2022-12-24 MED ORDER — ARIPIPRAZOLE 5 MG PO TABS
5.0000 mg | ORAL_TABLET | Freq: Every day | ORAL | Status: DC
  Filled 2022-12-24: qty 1

## 2022-12-24 MED ORDER — ARIPIPRAZOLE 5 MG PO TABS
5.0000 mg | ORAL_TABLET | Freq: Every day | ORAL | Status: DC
  Administered 2022-12-25: 5 mg via ORAL
  Filled 2022-12-24 (×2): qty 1

## 2022-12-24 NOTE — BH IP Treatment Plan (Signed)
Interdisciplinary Treatment and Diagnostic Plan Update  12/24/2022 Time of Session: 10am [name] MRN: [id number]  Principal Diagnosis: Major depressive disorder, recurrent episode, severe ([place])  Secondary Diagnoses: Principal Problem:   Major depressive disorder, recurrent episode, severe ([place]) Active Problems:   Generalized anxiety disorder   Suicidal behavior   Current Medications:  Current Facility-Administered Medications  Medication Dose Route Frequency Provider Last Rate Last Admin   alum & mag hydroxide-simeth (MAALOX/MYLANTA) 200-200-20 MG/5ML suspension 30 mL  30 mL Oral Q4H PRN Motley-Mangrum, Jadeka A, PMHNP       [START ON 12/25/2022] ARIPiprazole (ABILIFY) tablet 5 mg  5 mg Oral QHS Green, Terri L, RPH       aspirin tablet 325 mg  325 mg Oral Daily Motley-Mangrum, Jadeka A, PMHNP   325 mg at 12/24/22 0753   atorvastatin (LIPITOR) tablet 80 mg  80 mg Oral q morning Motley-Mangrum, Jadeka A, PMHNP   80 mg at 12/24/22 1045   busPIRone (BUSPAR) tablet 15 mg  15 mg Oral TID Lindell Spar I, NP   15 mg at 12/24/22 0750   DULoxetine (CYMBALTA) DR capsule 90 mg  90 mg Oral Daily Motley-Mangrum, Jadeka A, PMHNP   90 mg at 12/24/22 0750   ezetimibe (ZETIA) tablet 10 mg  10 mg Oral Daily Motley-Mangrum, Jadeka A, PMHNP   10 mg at 12/24/22 0750   HYDROcodone-acetaminophen (NORCO/VICODIN) 5-325 MG per tablet 1 tablet  1 tablet Oral Q8H PRN Motley-Mangrum, Jadeka A, PMHNP       hydrOXYzine (ATARAX) tablet 25 mg  25 mg Oral TID PRN Motley-Mangrum, Jadeka A, PMHNP   25 mg at 12/22/22 1153   losartan (COZAAR) tablet 100 mg  100 mg Oral Daily Motley-Mangrum, Jadeka A, PMHNP   100 mg at 12/24/22 0749   magnesium hydroxide (MILK OF MAGNESIA) suspension 30 mL  30 mL Oral Daily PRN Motley-Mangrum, Jadeka A, PMHNP       nicotine (NICODERM CQ - dosed in mg/24 hours) patch 14 mg  14 mg Transdermal Daily Motley-Mangrum, Jadeka A, PMHNP       traZODone (DESYREL) tablet 50 mg  50 mg Oral QHS PRN  Motley-Mangrum, Jadeka A, PMHNP       PTA Medications: Medications Prior to Admission  Medication Sig Dispense Refill Last Dose   aspirin 325 MG tablet Take 325 mg by mouth daily.   12/21/2022   atorvastatin (LIPITOR) 80 MG tablet Take 1 tablet (80 mg total) by mouth every morning. (Patient taking differently: Take 80 mg by mouth every evening.) 90 tablet 3 Past Week   busPIRone (BUSPAR) 7.5 MG tablet Take 1 tablet (7.5 mg total) by mouth 3 (three) times daily. For anxiety 90 tablet 3 Past Month   cetirizine (ZYRTEC) 10 MG tablet Take 1 tablet by mouth daily.   Past Week   Cholecalciferol 50 MCG (2000 UT) TABS Take by mouth.   Past Week   DULoxetine (CYMBALTA) 30 MG capsule Take 1 capsule (30 mg total) by mouth daily. Take with '60mg'$  to equal '90mg'$  90 capsule 3 Past Week   DULoxetine (CYMBALTA) 60 MG capsule Take 1 capsule (60 mg total) by mouth daily. Take with '30mg'$  daily to equal '90mg'$  90 capsule 3 Past Week   ezetimibe (ZETIA) 10 MG tablet Take 1 tablet (10 mg total) by mouth daily. 90 tablet 3 Past Week   fluticasone (FLONASE) 50 MCG/ACT nasal spray Place 2 sprays into both nostrils at bedtime as needed for allergies or rhinitis.   Past Month  HYDROcodone-acetaminophen (NORCO/VICODIN) 5-325 MG tablet Take 1 tablet by mouth every 8 (eight) hours as needed for severe pain. 90 tablet 0 Past Month   Multiple Vitamin (MULTIVITAMIN) tablet Take 1 tablet by mouth daily.   Past Week   testosterone cypionate (DEPOTESTOSTERONE CYPIONATE) 200 MG/ML injection Inject 5 mLs into the muscle every 7 (seven) days.   Past Month   losartan (COZAAR) 100 MG tablet Take 1 tablet (100 mg total) by mouth daily. For high blood pressure. 90 tablet 3     Patient Stressors: Marital or family conflict   Traumatic event    Patient Strengths: Motivation for treatment/growth  Physical Health  Supportive family/friends   Treatment Modalities: Medication Management, Group therapy, Case management,  1 to 1 session with  clinician, Psychoeducation, Recreational therapy.   Physician Treatment Plan for Primary Diagnosis: Major depressive disorder, recurrent episode, severe ([place]) Long Term Goal(s): Improvement in symptoms so as ready for discharge   Short Term Goals: Ability to identify and develop effective coping behaviors will improve Ability to maintain clinical measurements within normal limits will improve Compliance with prescribed medications will improve Ability to identify triggers associated with substance abuse/mental health issues will improve Ability to identify changes in lifestyle to reduce recurrence of condition will improve Ability to verbalize feelings will improve Ability to disclose and discuss suicidal ideas Ability to demonstrate self-control will improve  Medication Management: Evaluate patient's response, side effects, and tolerance of medication regimen.  Therapeutic Interventions: 1 to 1 sessions, Unit Group sessions and Medication administration.  Evaluation of Outcomes: Progressing  Physician Treatment Plan for Secondary Diagnosis: Principal Problem:   Major depressive disorder, recurrent episode, severe ([name]) Active Problems:   Generalized anxiety disorder   Suicidal behavior  Long Term Goal(s): Improvement in symptoms so as ready for discharge   Short Term Goals: Ability to identify and develop effective coping behaviors will improve Ability to maintain clinical measurements within normal limits will improve Compliance with prescribed medications will improve Ability to identify triggers associated with substance abuse/mental health issues will improve Ability to identify changes in lifestyle to reduce recurrence of condition will improve Ability to verbalize feelings will improve Ability to disclose and discuss suicidal ideas Ability to demonstrate self-control will improve     Medication Management: Evaluate patient's response, side effects, and tolerance of  medication regimen.  Therapeutic Interventions: 1 to 1 sessions, Unit Group sessions and Medication administration.  Evaluation of Outcomes: Progressing   RN Treatment Plan for Primary Diagnosis: Major depressive disorder, recurrent episode, severe ([place]) Long Term Goal(s): Knowledge of disease and therapeutic regimen to maintain health will improve  Short Term Goals: Ability to remain free from injury will improve, Ability to verbalize frustration and anger appropriately will improve, Ability to demonstrate self-control, Ability to participate in decision making will improve, Ability to verbalize feelings will improve, Ability to disclose and discuss suicidal ideas, Ability to identify and develop effective coping behaviors will improve, and Compliance with prescribed medications will improve  Medication Management: RN will administer medications as ordered by provider, will assess and evaluate patient's response and provide education to patient for prescribed medication. RN will report any adverse and/or side effects to prescribing provider.  Therapeutic Interventions: 1 on 1 counseling sessions, Psychoeducation, Medication administration, Evaluate responses to treatment, Monitor vital signs and CBGs as ordered, Perform/monitor CIWA, COWS, AIMS and Fall Risk screenings as ordered, Perform wound care treatments as ordered.  Evaluation of Outcomes: Progressing   LCSW Treatment Plan for Primary Diagnosis: Major depressive disorder,  recurrent episode, severe ([place]) Long Term Goal(s): Safe transition to appropriate next level of care at discharge, Engage patient in therapeutic group addressing interpersonal concerns.  Short Term Goals: Engage patient in aftercare planning with referrals and resources, Increase social support, Increase ability to appropriately verbalize feelings, Increase emotional regulation, Facilitate acceptance of mental health diagnosis and concerns, Facilitate patient  progression through stages of change regarding substance use diagnoses and concerns, Identify triggers associated with mental health/substance abuse issues, and Increase skills for wellness and recovery  Therapeutic Interventions: Assess for all discharge needs, 1 to 1 time with Social worker, Explore available resources and support systems, Assess for adequacy in community support network, Educate family and significant other(s) on suicide prevention, Complete Psychosocial Assessment, Interpersonal group therapy.  Evaluation of Outcomes: Progressing   Progress in Treatment: Attending groups: Yes. Participating in groups: Yes. Taking medication as prescribed: Yes. Toleration medication: Yes. Family/Significant other contact made: Yes, individual(s) contacted:  Rosezella [place] [phone] Patient understands diagnosis: Yes. Discussing patient identified problems/goals with staff: Yes. Medical problems stabilized or resolved: Yes. Denies suicidal/homicidal ideation: Yes. Issues/concerns per patient self-inventory: No.   New problem(s) identified: No, Describe:  none reported   New Short Term/Long Term Goal(s):   medication stabilization, elimination of SI thoughts, development of comprehensive mental wellness plan.    Patient Goals:  Pt states, "I want to learn boundaries"  Discharge Plan or Barriers: Patient recently admitted. CSW will continue to follow and assess for appropriate referrals and possible discharge planning.    Reason for Continuation of Hospitalization: Anxiety Depression Medication stabilization Suicidal ideation  Estimated Length of Stay: 1-3 days  Last 3 [place] Suicide Severity Risk Score: [place] Admission (Current) from 12/21/2022 in [place] [facility] ED from 12/20/2022 in [facility] Emergency Department at [place] High Risk High Risk       Last [facility] 2/9 Scores:    07/04/2022    2:30 PM  05/28/2022   12:07 PM 02/22/2022    1:27 PM  Depression screen PHQ 2/9  Decreased Interest '1 2 1  '$ Down, Depressed, Hopeless '1 1 1  '$ PHQ - 2 Score '2 3 2  '$ Altered sleeping '2 2 1  '$ Tired, decreased energy '1 1 1  '$ Change in appetite 1 1 0  Feeling bad or failure about yourself  2 2 0  Trouble concentrating 1 1 0  Moving slowly or fidgety/restless 1 1 0  Suicidal thoughts 0 0 0  PHQ-9 Score '10 11 4  '$ Difficult doing work/chores  Somewhat difficult Not difficult at all    Scribe for Treatment Team: Zachery Conch, LCSW 12/24/2022 1:41 PM

## 2022-12-24 NOTE — Progress Notes (Signed)
Nurse discussed anxiety, depression and coping skills with patient.  

## 2022-12-24 NOTE — Progress Notes (Signed)
   12/23/22 2000  Psychosocial Assessment  Patient Complaints None  Eye Contact Fair  Affect Appropriate to circumstance  Speech Logical/coherent  Interaction Superficial  Motor Activity Other (Comment) (WNL)  Appearance/Hygiene Unremarkable  Behavior Characteristics Cooperative  Mood Pleasant  Thought Process  Coherency WDL  Content WDL  Delusions None reported or observed  Perception WDL  Hallucination None reported or observed  Judgment Limited  Confusion None  Danger to Self  Current suicidal ideation? Denies  Self-Injurious Behavior No self-injurious ideation or behavior indicators observed or expressed   Danger to Others  Danger to Others None reported or observed

## 2022-12-24 NOTE — Progress Notes (Signed)
Patient asked if one of his meds will make him drowsy during the day.  Will discuss with MD.

## 2022-12-24 NOTE — BHH Suicide Risk Assessment (Signed)
[place] INPATIENT:  Family/Significant Other Suicide Prevention Education  Suicide Prevention Education:  Education Completed;  Girlfriend [name] [phone],  (name of family member/significant other) has been identified by the patient as the family member/significant other with whom the patient will be residing, and identified as the person(s) who will aid the patient in the event of a mental health crisis (suicidal ideations/suicide attempt).  With written consent from the patient, the family member/significant other has been provided the following suicide prevention education, prior to the and/or following the discharge of the patient.  The suicide prevention education provided includes the following: Suicide risk factors Suicide prevention and interventions National Suicide Hotline telephone number [facility] assessment telephone number [facility] Emergency Assistance [place] and/or Residential Mobile Crisis Unit telephone number  Request made of family/significant other to: Remove weapons (e.g., guns, rifles, knives), all items previously/currently identified as safety concern.   Remove drugs/medications (over-the-counter, prescriptions, illicit drugs), all items previously/currently identified as a safety concern.  The family member/significant other verbalizes understanding of the suicide prevention education information provided.  The family member/significant other agrees to remove the items of safety concern listed above.  CSW spoke with patient best friend [employer]) regarding patient gun that he had with him while riding in his truck. [name] said that he has patient gun and it is secured and that he had no plans on giving it back, also confirmed that he dropped patient keys off for truck. Then CSW called patient girlfriend [name] and spoke with her to complete safety planning since patient will be returning back with her. [name] states how  patient has been stressing a lot, dealing with his children, step siblings, and having to care for his mom who was recently diagnosis's with Alzheimer's. [name] states that this is patient first hospitalization, and how patient tried to commit suicide some months ago by taking 5 xanax due to all the stress. [name] said that patient was going through therapy to help cope with his stress but patient stopped after two months, "he thought he was ready and did not need it anymore, I personally feel that his therapist was not effective enough, and I been looking elsewhere for him to try someone else". Furthermore, [name] continue to go on about patient step siblings, his divorce, and relationship with his children. Patient is concealed to carry , says [name]; [name] has that their was a  hand gun in the house but is secured. However, patient old house where his son is currently living, their is too another gun in there in a  safe,  son does not know the combination code to change the code . Instead, [name] said that her and patient son is working on removing the safe due to not having the combination and that everything will be secured in both homes once patient is released. CSW did inform [name] of the anticipated DC date and alternative follow up appointments for therapy and medication management; Afterwards, girlfriend said that she feels patient is getting better, has some boundaries for him once he comes home, which are to continue therapy and not stop, go to church, and to start setting boundaries with is step siblings that is causing most of his stress since they are hateful/rude towards him. [name] had no further questions.    Sherre Lain 12/24/2022, 11:51 AM

## 2022-12-24 NOTE — BHH Group Notes (Signed)
Spiritual care group on grief and loss facilitated by chaplain Vietta Bonifield Andree Elk and Lysle Morales, counseling intern.   Group Goal: Support / Education around grief and loss  Members engage in facilitated group support and psycho-social education.  Group Description:  Following introductions and group rules, group members engaged in facilitated group dialogue and support around topic of loss, with particular support around experiences of loss in their lives. Group Identified types of loss (relationships / self / things) and identified patterns, circumstances, and changes that precipitate losses. Reflected on thoughts / feelings around loss, normalized grief responses, and recognized variety in grief experience. Group encouraged individual reflection on safe space and on the coping skills that they are already utilizing.   Group drew on Adlerian / Rogerian and narrative framework  Patient Progress: Prayan was an active participant in group.  He was able to descriptively share about his safe space and coping skills through utilizing nature.

## 2022-12-24 NOTE — Group Note (Signed)
Occupational Therapy Group Note  Group Topic:Coping Skills  Group Date: 12/24/2022 Start Time: 1415 End Time: 1510 Facilitators: Brantley Stage, OT   Group Description: Group encouraged increased engagement and participation through discussion and activity focused on "Coping Ahead." Patients were split up into teams and selected a card from a stack of positive coping strategies. Patients were instructed to act out/charade the coping skill for other peers to guess and receive points for their team. Discussion followed with a focus on identifying additional positive coping strategies and patients shared how they were going to cope ahead over the weekend while continuing hospitalization stay.  Therapeutic Goal(s): Identify positive vs negative coping strategies. Identify coping skills to be used during hospitalization vs coping skills outside of hospital/at home Increase participation in therapeutic group environment and promote engagement in treatment   Participation Level: Active and Engaged   Participation Quality: Independent   Behavior: Appropriate   Speech/Thought Process: Coherent and Directed   Affect/Mood: Appropriate   Insight: Fair   Judgement: Fair   Individualization: pt was active in their participation of group discussion/activity. New coping skills were identified  Modes of Intervention: Discussion and Education  Patient Response to Interventions:  Attentive   Plan: Continue to engage patient in OT groups 2 - 3x/week.  12/24/2022  Brantley Stage, OT Cornell Barman, OT

## 2022-12-24 NOTE — Progress Notes (Signed)
Pt rates depression 0/10 and anxiety 0/10. Pt reports he got to go outside and "it felt good to get fresh air". Pt reports a good appetite, and no physical problems. Pt denies SI/HI/AVH and verbally contracts for safety. Provided support and encouragement. Pt safe on the unit. Q 15 minute safety checks continued.

## 2022-12-24 NOTE — Progress Notes (Signed)
[facility] MD Progress Note  12/24/2022 12:45 PM [name]  MRN:  [id number]  Reason for admission: 62 year old divorced Caucasian male with hx of major depressive disorder, recurrent episodes, anxiety disorder & PTSD. Patient has other chronic medical conditions. He is admitted to the [facility] from the [place] with complaint of suicidal ideations with plans to use a gun on himself. Chart review indicated was triggered by familial stressors, an aging parent (mother) with dementia, a recent death of step-father with dementia & family discord (patient in disagreement with step-siblings on how to care for their aging demented parents).   Today's notes: [name] is seen and examined on [place].  The chart findings discussed with the treatment team and attending psychiatrist.  He presents alert, oriented, slightly hard of hearing and aware of situation. He is visible on the unit, attending group sessions. He presents with an improved/reactive affect.  States he is learning well from therapeutic milieu, learning how to set boundaries and standby it.  Added that his mood is good and his affect is pleasant.  Reports sleeping over 7 hours last night and being restful.  He reports good appetite and drinking increase fluid for hydration.  Denies symptoms of depression or anxiety.  When asked what brought patient to the hospital, reports, "I have multiple family stressors.  My mom is severely demented, my stepfather had dementia and died recently and actually being buried today.  I have 3 stepsiblings that is hard to get along with, and I can get along with my daughter and son.  I was so overwhelmed and I drove away with a gun with suicidal thoughts, however, I did not do it."  When asked who had the gun currently, reported my friend has it and has kept it in a safe place.  Patient reports that he has agreed to start on medications of Abilify 5 mg p.o. daily which is changed to q. nightly due to sleepiness as adjunct  to Cymbalta for depression, BuSpar 15 mg p.o. TID for anxiety and Cymbalta 90 mg p.o. daily for depression.  Reports tolerating medication well without any side effects.  Terrie currently denies any SIHI, AVH, delusional thoughts or paranoia. He does not appear to be responding to any internal stimuli.  Reviewed lab results of 12-22-22: CMP- elevated Gluc level- 133, Hgba1c- 6.9 (pre-diabetic). U/A: Presence of blood (small). Discussed the lab results with patient. Instructed to follow-up with his primary care provider after discharge. Vital signs without critical value.   Principal Problem: Major depressive disorder, recurrent episode, severe ([place])  Total Time spent with patient:  35 minutes  Past Psychiatric History: See H&P  Past Medical History:  Past Medical History:  Diagnosis Date   Acute pancreatitis    Anxiety    Atrial flutter ([place]) 09/30/2013   Benign essential hypertension    per pt denies ,  but is documented in epic by his pcp   Bladder cancer [facility]) urologist-- dr bell   dx 08/ 2019  s/p  TURBT   Bladder tumor    CAD (coronary artery disease)    last cardiac cath 08-12-2014  mild non-obstructive CAD ,  normal LVF   DDD (degenerative disc disease), lumbar    Depression    Diabetes mellitus type 2, diet-controlled ([place])    Fatty liver    First degree heart block    Headache(784.0)    History of acute pancreatitis 02/2015   History of kidney stones  History of syncope    Hyperlipidemia    Intermediate coronary syndrome [facility])    Intermittent palpitations    takes bystolic   Obesity    Pancreatic divisum 2016   PTSD (post-traumatic stress disorder)     Past Surgical History:  Procedure Laterality Date   CARDIAC CATHETERIZATION  10-08-2002;  03-15-2006   mild non-obstructive CAD,  normal LVF   ENTEROSCOPY N/A 03/20/2015   Procedure: ENTEROSCOPY;  Surgeon: Jerene Bears, MD;  Location: [place];  Service: Endoscopy;  Laterality: N/A;   ERCP  04/2015   stone in  pancreatic duct   LEFT HEART CATHETERIZATION WITH CORONARY ANGIOGRAM N/A 08/12/2014   Procedure: LEFT HEART CATHETERIZATION WITH CORONARY ANGIOGRAM;  Surgeon: Burnell Blanks, MD;  Location: [facility] CATH LAB;  Service: Cardiovascular;  Laterality: N/A;   POSTERIOR LUMBAR FUSION  2010   L3 -- 5   TONSILLECTOMY  1979  age 37   TRANSURETHRAL RESECTION OF BLADDER TUMOR WITH MITOMYCIN-C N/A 07/23/2018   Procedure: TRANSURETHRAL RESECTION OF BLADDER TUMOR WITH GEMCITABINE;  Surgeon: Lucas Mallow, MD;  Location: [facility];  Service: Urology;  Laterality: N/A;   TRANSURETHRAL RESECTION OF BLADDER TUMOR WITH MITOMYCIN-C N/A 12/19/2018   Procedure: TRANSURETHRAL RESECTION OF BLADDER TUMOR WITH GEMCITABINE;  Surgeon: Lucas Mallow, MD;  Location: [facility];  Service: Urology;  Laterality: N/A;   Family History:  Family History  Problem Relation Age of Onset   Heart attack Father        father died of heart attach in his 39s   Hyperlipidemia Father    Early death Father    Lung cancer Mother    Hyperlipidemia Mother    Hyperlipidemia Brother    Alzheimer's disease Maternal Grandmother    Colon cancer Neg Hx    Colon polyps Neg Hx    Esophageal cancer Neg Hx    Rectal cancer Neg Hx    Stomach cancer Neg Hx    Family Psychiatric  History: See H&P.  Social History:  Social History   Substance and Sexual Activity  Alcohol Use Not Currently   Comment: socially     Social History   Substance and Sexual Activity  Drug Use No    Social History   Socioeconomic History   Marital status: Divorced    Spouse name: Not on file   Number of children: 2   Years of education: Not on file   Highest education level: Not on file  Occupational History   Not on file  Tobacco Use   Smoking status: Never   Smokeless tobacco: Not on file   Tobacco comments:    Patient indicated interest for nicotine patch.  Vaping Use   Vaping Use: Some days  Substance and Sexual Activity    Alcohol use: Not Currently    Comment: socially   Drug use: No   Sexual activity: Yes  Other Topics Concern   Not on file  Social History Narrative   ** Merged History Encounter **       Social Determinants of Health   Financial Resource Strain: Medium Risk (01/31/2022)   Overall Financial Resource Strain (CARDIA)    Difficulty of Paying Living Expenses: Somewhat hard  Food Insecurity: Food Insecurity Present (12/21/2022)   Hunger Vital Sign    Worried About Running Out of Food in the Last Year: Sometimes true    Ran Out of Food in the Last Year: Never true  Transportation Needs: No Transportation  Needs (12/21/2022)   PRAPARE - Hydrologist (Medical): No    Lack of Transportation (Non-Medical): No  Physical Activity: Inactive (01/31/2022)   Exercise Vital Sign    Days of Exercise per Week: 0 days    Minutes of Exercise per Session: 0 min  Stress: Stress Concern Present (01/31/2022)   [place]    Feeling of Stress : To some extent  Social Connections: Moderately Isolated (12/21/2022)   Social Connection and Isolation Panel [NHANES]    Frequency of Communication with Friends and Family: More than three times a week    Frequency of Social Gatherings with Friends and Family: Twice a week    Attends Religious Services: 1 to 4 times per year    Active Member of [employer] or Organizations: Patient refused    Attends Archivist Meetings: Never    Marital Status: Divorced   Additional Social History:   Sleep: Good  Appetite:  Good  Current Medications: Current Facility-Administered Medications  Medication Dose Route Frequency Provider Last Rate Last Admin   alum & mag hydroxide-simeth (MAALOX/MYLANTA) 200-200-20 MG/5ML suspension 30 mL  30 mL Oral Q4H PRN Motley-Mangrum, Jadeka A, PMHNP       [START ON 12/25/2022] ARIPiprazole (ABILIFY) tablet 5 mg  5 mg Oral QHS Green, Terri L, RPH        aspirin tablet 325 mg  325 mg Oral Daily Motley-Mangrum, Jadeka A, PMHNP   325 mg at 12/24/22 0753   atorvastatin (LIPITOR) tablet 80 mg  80 mg Oral q morning Motley-Mangrum, Jadeka A, PMHNP   80 mg at 12/23/22 0933   busPIRone (BUSPAR) tablet 15 mg  15 mg Oral TID Lindell Spar I, NP   15 mg at 12/24/22 0750   DULoxetine (CYMBALTA) DR capsule 90 mg  90 mg Oral Daily Motley-Mangrum, Jadeka A, PMHNP   90 mg at 12/24/22 0750   ezetimibe (ZETIA) tablet 10 mg  10 mg Oral Daily Motley-Mangrum, Jadeka A, PMHNP   10 mg at 12/24/22 0750   HYDROcodone-acetaminophen (NORCO/VICODIN) 5-325 MG per tablet 1 tablet  1 tablet Oral Q8H PRN Motley-Mangrum, Jadeka A, PMHNP       hydrOXYzine (ATARAX) tablet 25 mg  25 mg Oral TID PRN Motley-Mangrum, Jadeka A, PMHNP   25 mg at 12/22/22 1153   losartan (COZAAR) tablet 100 mg  100 mg Oral Daily Motley-Mangrum, Jadeka A, PMHNP   100 mg at 12/24/22 0749   magnesium hydroxide (MILK OF MAGNESIA) suspension 30 mL  30 mL Oral Daily PRN Motley-Mangrum, Jadeka A, PMHNP       nicotine (NICODERM CQ - dosed in mg/24 hours) patch 14 mg  14 mg Transdermal Daily Motley-Mangrum, Jadeka A, PMHNP       traZODone (DESYREL) tablet 50 mg  50 mg Oral QHS PRN Motley-Mangrum, Al Pimple, PMHNP       Lab Results:  Results for orders placed or performed during the hospital encounter of 12/21/22 (from the past 48 hour(s))  Comprehensive metabolic panel     Status: Abnormal   Collection Time: 12/23/22  6:33 AM  Result Value Ref Range   Sodium 138 135 - 145 mmol/L   Potassium 4.2 3.5 - 5.1 mmol/L   Chloride 103 98 - 111 mmol/L   CO2 25 22 - 32 mmol/L   Glucose, Bld 133 (H) 70 - 99 mg/dL    Comment: Glucose reference range applies only to samples taken after fasting  for at least 8 hours.   BUN 16 8 - 23 mg/dL   Creatinine, Ser 1.02 0.61 - 1.24 mg/dL   Calcium 9.1 8.9 - 10.3 mg/dL   Total Protein 6.5 6.5 - 8.1 g/dL   Albumin 3.5 3.5 - 5.0 g/dL   AST 38 15 - 41 U/L   ALT 43 0 - 44 U/L    Alkaline Phosphatase 58 38 - 126 U/L   Total Bilirubin 0.8 0.3 - 1.2 mg/dL   GFR, Estimated >60 >60 mL/min    Comment: (NOTE) Calculated using the CKD-EPI Creatinine Equation (2021)    Anion gap 10 5 - 15    Comment: Performed at [facility], [place] [address] [place], [place] [address]  Hemoglobin A1c     Status: Abnormal   Collection Time: 12/23/22  6:33 AM  Result Value Ref Range   Hgb A1c MFr Bld 6.9 (H) 4.8 - 5.6 %    Comment: (NOTE) Pre diabetes:          5.7%-6.4%  Diabetes:              >6.4%  Glycemic control for   <7.0% adults with diabetes    Mean Plasma Glucose 151.33 mg/dL    Comment: Performed at [facility] [address] [place], [place] [address]   Blood Alcohol level:  Lab Results  Component Value Date   ETH <10 38/75/6433   Metabolic Disorder Labs: Lab Results  Component Value Date   HGBA1C 6.9 (H) 12/23/2022   MPG 151.33 12/23/2022   MPG 142.72 12/22/2022   No results found for: "PROLACTIN" Lab Results  Component Value Date   CHOL 116 12/22/2022   TRIG 104 12/22/2022   HDL 34 (L) 12/22/2022   CHOLHDL 3.4 12/22/2022   VLDL 21 12/22/2022   LDLCALC 61 12/22/2022   LDLCALC 85 06/21/2021   Physical Findings: AIMS:  , ,  ,  ,    CIWA:    COWS:     Musculoskeletal: Strength & Muscle Tone: within normal limits Gait & Station: normal Patient leans: N/A  Psychiatric Specialty Exam:  Presentation  General Appearance:  Appropriate for Environment; Casual  Eye Contact: Good  Speech: Clear and Coherent; Normal Rate  Speech Volume: Normal  Handedness: Right  Mood and Affect  Mood: Anxious; Depressed  Affect: Congruent  Thought Process  Thought Processes: Coherent  Descriptions of Associations:Intact  Orientation:Full (Time, Place and Person)  Thought Content:Logical  History of Schizophrenia/Schizoaffective disorder:No  Duration of Psychotic Symptoms:No data  recorded Hallucinations:Hallucinations: None  Ideas of Reference:None  Suicidal Thoughts:Suicidal Thoughts: No SI Passive Intent and/or Plan: -- (Patient denies)  Homicidal Thoughts:Homicidal Thoughts: No  Sensorium  Memory: Immediate Good; Recent Good  Judgment: Good  Insight: Good  Executive Functions  Concentration: Good  Attention Span: Good  Recall: Good  Fund of Knowledge: Good  Language: Good  Psychomotor Activity  Psychomotor Activity: Psychomotor Activity: Normal  Assets  Assets: Communication Skills; Desire for Improvement; Housing; Resilience; Physical Health  Sleep  Sleep: Sleep: Good Number of Hours of Sleep: 7  Physical Exam: Physical Exam Vitals and nursing note reviewed.  HENT:     Head: Normocephalic.     Nose: Nose normal.     Mouth/Throat:     Pharynx: Oropharynx is clear.  Eyes:     Pupils: Pupils are equal, round, and reactive to light.  Cardiovascular:     Rate and Rhythm: Normal rate.     Pulses: Normal  pulses.  Pulmonary:     Effort: Pulmonary effort is normal.  Abdominal:     Palpations: Abdomen is soft.  Genitourinary:    Comments: Deferred Musculoskeletal:        General: Normal range of motion.     Cervical back: Normal range of motion.  Skin:    General: Skin is warm and dry.  Neurological:     General: No focal deficit present.     Mental Status: He is alert and oriented to person, place, and time.  Psychiatric:        Behavior: Behavior normal.    Review of Systems  Constitutional:  Negative for chills, fever and malaise/fatigue.  HENT:  Negative for congestion and sore throat.   Eyes:  Negative for blurred vision.  Respiratory:  Negative for cough, shortness of breath and wheezing.   Cardiovascular:  Negative for chest pain and palpitations.       Blood pressure 155/95, pulse 76.  Nursing staff to recheck vital signs  Gastrointestinal:  Negative for abdominal pain, constipation, diarrhea, heartburn,  nausea and vomiting.  Genitourinary:  Negative for dysuria, flank pain, frequency, hematuria and urgency.       Hx. Bladder cancer.  Musculoskeletal:  Negative for joint pain and myalgias.  Skin:  Negative for itching and rash.  Psychiatric/Behavioral:  Positive for depression. The patient is nervous/anxious.    Blood pressure (!) 155/95, pulse 76, temperature 98.6 F (37 C), temperature source Oral, resp. rate 16, height '5\' 11"'$  (1.803 m), weight 99.8 kg, SpO2 97 %. Body mass index is 30.68 kg/m.  Treatment Plan Summary: Daily contact with patient to assess and evaluate symptoms and progress in treatment and Medication management.   Continue inpatient hospitalization. Will continue today 12/24/2022 plan as below except where it is noted.   Principal/active diagnoses.  Major depressive disorder, recurrent episode, severe ([place]). Generalized anxiety disorder.  Associated symptoms. Suicidal behavior  Plan:  -Initiated Abilify 5 mg po daily as adjunct to Cymbalta for depression. -Continue Duloxetine 90 mg po daily for depression.  -Continue Buspar 15 mg po tid for anxiety.  -Continue Hydroxyzine 25 mg po tid prn for anxiety.  -Continue Trazodone 50 mg po Q bedtime prn for insomnia.   Other medical issues.  -Continue ASA 325 mg po daily for heart health.  -Continue Lipitor 80 mg po Q evenings for hyperlipidemia.  -Continue Zetia 10 mg po daily for high cholesterol.  -Continue Hydrocodone 5-325 mg po Q 8 hours prn for back pain.  -Continue Losartan 100 mg po daily for HTN.    Reviewed lab results: CMP- elevated Gluc level- 133. Hgba1c: 6.9 (pre-diabetic). U/A: Presence of blood (small).   Other PRNS -Continue Tylenol 650 mg every 6 hours PRN for mild pain -Continue Maalox 30 ml Q 4 hrs PRN for indigestion -Continue MOM 30 ml po Q 6 hrs for constipation   Safety and Monitoring: Voluntary admission to inpatient psychiatric unit for safety, stabilization and treatment Daily  contact with patient to assess and evaluate symptoms and progress in treatment Patient's case to be discussed in multi-disciplinary team meeting Observation Level : q15 minute checks Vital signs: q12 hours Precautions: Safety   Discharge Planning: Social work and case management to assist with discharge planning and identification of hospital follow-up needs prior to discharge Estimated LOS: 5-7 days Discharge Concerns: Need to establish a safety plan; Medication compliance and effectiveness Discharge Goals: Return home with outpatient referrals for mental health follow-up including medication management/psychotherapy  [name]  Stephen Labate, FNP, pmhnp, fnp-bc 12/24/2022, 12:45 PMPatient ID: [name], male   DOB: 1960/12/26, 62 y.o.   MRN: [id number]

## 2022-12-24 NOTE — Progress Notes (Signed)
D:  Patient's self inventory sheet, patient sleeps good, no sleep medication.  Good appetite, normal energy level, good concentration.  Denied depression, hopeless and anxiety.  Denied withdrawals.  Denied SI.  Denied physical problems.  Denied physical pain.  Goal is discharge home.  Feel better and ready for discharge.  Not sure of discharge plans. A:  Medications administered per MD orders.  Emotional support and encouragement given patient. R:  Denied SI and HI, contracts for safety.  Denied A/V hallucinations.  Safety maintained with 15 minute checks.

## 2022-12-25 MED ORDER — CLONIDINE HCL 0.1 MG PO TABS
0.1000 mg | ORAL_TABLET | Freq: Every day | ORAL | Status: AC
  Administered 2022-12-25: 0.1 mg via ORAL
  Filled 2022-12-25 (×2): qty 1

## 2022-12-25 MED ORDER — CLONIDINE HCL 0.1 MG PO TABS
0.1000 mg | ORAL_TABLET | ORAL | Status: DC
  Filled 2022-12-25 (×7): qty 1

## 2022-12-25 MED ORDER — CLONIDINE HCL 0.1 MG PO TABS: 0.1000 mg | ORAL_TABLET | ORAL | Status: DC | PRN

## 2022-12-25 NOTE — Progress Notes (Signed)
[facility] MD Progress Note  12/25/2022 9:58 AM [name]  MRN:  [id number]  Reason for admission: 61 year old divorced Caucasian male with hx of major depressive disorder, recurrent episodes, anxiety disorder & PTSD. Patient has other chronic medical conditions. He is admitted to the [facility] from the [place] with complaint of suicidal ideations with plans to use a gun on himself. Chart review indicated was triggered by familial stressors, an aging parent (mother) with dementia, a recent death of step-father with dementia & family discord (patient in disagreement with step-siblings on how to care for their aging demented parents).   Today's notes: [name] is seen and examined on [place].  The chart findings discussed with the treatment team and attending psychiatrist.  He presents alert, oriented, slightly hard of hearing and aware of situation. He is visible on the unit, attending group sessions and therapeutic milieu. He presents with euthymic mood and much improved affect.  States he is learning well from therapeutic milieu, learning how to identify coping skills to be used during hospitalization versus coping skills outside of the hospital/at home.  Added that his mood is good and his affect is pleasant.  Reports sleeping over 8 hours last night without any sleep aid and being restful.  He reports good appetite and drinking increase fluid for hydration.  Denies symptoms of depression or anxiety. Patient continues on scheduled medications of Abilify 5 mg p.o. q. nightly as adjunct to Cymbalta for depression, BuSpar 15 mg p.o. TID for anxiety and Cymbalta 90 mg p.o. daily for depression.  Reports tolerating medications well without any side effects.  [name] currently denies any SIHI, AVH, delusional thoughts or paranoia. He does not appear to be responding to any internal stimuli.  Labs and vital signs reviewed without critical value.  BP of 158/103, nursing staff to recheck blood pressure.  Patient made  aware of prospective discharge date of 12/26/2022.  Principal Problem: Major depressive disorder, recurrent episode, severe ([place])  Total Time spent with patient:  35 minutes  Past Psychiatric History: See H&P  Past Medical History:  Past Medical History:  Diagnosis Date   Acute pancreatitis    Anxiety    Atrial flutter ([place]) 09/30/2013   Benign essential hypertension    per pt denies ,  but is documented in epic by his pcp   Bladder cancer [facility]) urologist-- dr bell   dx 08/ 2019  s/p  TURBT   Bladder tumor    CAD (coronary artery disease)    last cardiac cath 08-12-2014  mild non-obstructive CAD ,  normal LVF   DDD (degenerative disc disease), lumbar    Depression    Diabetes mellitus type 2, diet-controlled ([place])    Fatty liver    First degree heart block    Headache(784.0)    History of acute pancreatitis 02/2015   History of kidney stones    History of syncope    Hyperlipidemia    Intermediate coronary syndrome (HCC)    Intermittent palpitations    takes bystolic   Obesity    Pancreatic divisum 2016   PTSD (post-traumatic stress disorder)     Past Surgical History:  Procedure Laterality Date   CARDIAC CATHETERIZATION  10-08-2002;  03-15-2006   mild non-obstructive CAD,  normal LVF   ENTEROSCOPY N/A 03/20/2015   Procedure: ENTEROSCOPY;  Surgeon: Jerene Bears, MD;  Location: [facility] ENDOSCOPY;  Service: Endoscopy;  Laterality: N/A;   ERCP  04/2015   stone in pancreatic duct  LEFT HEART CATHETERIZATION WITH CORONARY ANGIOGRAM N/A 08/12/2014   Procedure: LEFT HEART CATHETERIZATION WITH CORONARY ANGIOGRAM;  Surgeon: Burnell Blanks, MD;  Location: [facility] CATH LAB;  Service: Cardiovascular;  Laterality: N/A;   POSTERIOR LUMBAR FUSION  2010   L3 -- 5   TONSILLECTOMY  1979  age 71   TRANSURETHRAL RESECTION OF BLADDER TUMOR WITH MITOMYCIN-C N/A 07/23/2018   Procedure: TRANSURETHRAL RESECTION OF BLADDER TUMOR WITH GEMCITABINE;  Surgeon: Lucas Mallow, MD;  Location: [facility]  ORS;  Service: Urology;  Laterality: N/A;   TRANSURETHRAL RESECTION OF BLADDER TUMOR WITH MITOMYCIN-C N/A 12/19/2018   Procedure: TRANSURETHRAL RESECTION OF BLADDER TUMOR WITH GEMCITABINE;  Surgeon: Lucas Mallow, MD;  Location: [facility];  Service: Urology;  Laterality: N/A;   Family History:  Family History  Problem Relation Age of Onset   Heart attack Father        father died of heart attach in his 94s   Hyperlipidemia Father    Early death Father    Lung cancer Mother    Hyperlipidemia Mother    Hyperlipidemia Brother    Alzheimer's disease Maternal Grandmother    Colon cancer Neg Hx    Colon polyps Neg Hx    Esophageal cancer Neg Hx    Rectal cancer Neg Hx    Stomach cancer Neg Hx    Family Psychiatric  History: See H&P.  Social History:  Social History   Substance and Sexual Activity  Alcohol Use Not Currently   Comment: socially     Social History   Substance and Sexual Activity  Drug Use No    Social History   Socioeconomic History   Marital status: Divorced    Spouse name: Not on file   Number of children: 2   Years of education: Not on file   Highest education level: Not on file  Occupational History   Not on file  Tobacco Use   Smoking status: Never   Smokeless tobacco: Not on file   Tobacco comments:    Patient indicated interest for nicotine patch.  Vaping Use   Vaping Use: Some days  Substance and Sexual Activity   Alcohol use: Not Currently    Comment: socially   Drug use: No   Sexual activity: Yes  Other Topics Concern   Not on file  Social History Narrative   ** Merged History Encounter **       Social Determinants of Health   Financial Resource Strain: Medium Risk (01/31/2022)   Overall Financial Resource Strain (CARDIA)    Difficulty of Paying Living Expenses: Somewhat hard  Food Insecurity: Food Insecurity Present (12/21/2022)   Hunger Vital Sign    Worried About Running Out of Food in the Last Year:  Sometimes true    Ran Out of Food in the Last Year: Never true  Transportation Needs: No Transportation Needs (12/21/2022)   PRAPARE - Hydrologist (Medical): No    Lack of Transportation (Non-Medical): No  Physical Activity: Inactive (01/31/2022)   Exercise Vital Sign    Days of Exercise per Week: 0 days    Minutes of Exercise per Session: 0 min  Stress: Stress Concern Present (01/31/2022)   [place]    Feeling of Stress : To some extent  Social Connections: Moderately Isolated (12/21/2022)   Social Connection and Isolation Panel [NHANES]    Frequency of Communication with Friends and Family:  More than three times a week    Frequency of Social Gatherings with Friends and Family: Twice a week    Attends Religious Services: 1 to 4 times per year    Active Member of [employer] or Organizations: Patient refused    Attends Archivist Meetings: Never    Marital Status: Divorced   Additional Social History:   Sleep: Good  Appetite:  Good  Current Medications: Current Facility-Administered Medications  Medication Dose Route Frequency Provider Last Rate Last Admin   alum & mag hydroxide-simeth (MAALOX/MYLANTA) 200-200-20 MG/5ML suspension 30 mL  30 mL Oral Q4H PRN Motley-Mangrum, Jadeka A, PMHNP       ARIPiprazole (ABILIFY) tablet 5 mg  5 mg Oral QHS Green, Terri L, RPH       aspirin tablet 325 mg  325 mg Oral Daily Motley-Mangrum, Jadeka A, PMHNP   325 mg at 12/25/22 0756   atorvastatin (LIPITOR) tablet 80 mg  80 mg Oral q morning Motley-Mangrum, Jadeka A, PMHNP   80 mg at 12/25/22 0931   busPIRone (BUSPAR) tablet 15 mg  15 mg Oral TID Lindell Spar I, NP   15 mg at 12/25/22 0756   cloNIDine (CATAPRES) tablet 0.1 mg  0.1 mg Oral Q4H PRN Massengill, [name] Curd, MD       DULoxetine (CYMBALTA) DR capsule 90 mg  90 mg Oral Daily Motley-Mangrum, Jadeka A, PMHNP   90 mg at 12/25/22 0757   ezetimibe  (ZETIA) tablet 10 mg  10 mg Oral Daily Motley-Mangrum, Jadeka A, PMHNP   10 mg at 12/25/22 0756   HYDROcodone-acetaminophen (NORCO/VICODIN) 5-325 MG per tablet 1 tablet  1 tablet Oral Q8H PRN Motley-Mangrum, Jadeka A, PMHNP       hydrOXYzine (ATARAX) tablet 25 mg  25 mg Oral TID PRN Motley-Mangrum, Jadeka A, PMHNP   25 mg at 12/22/22 1153   losartan (COZAAR) tablet 100 mg  100 mg Oral Daily Motley-Mangrum, Jadeka A, PMHNP   100 mg at 12/25/22 0756   magnesium hydroxide (MILK OF MAGNESIA) suspension 30 mL  30 mL Oral Daily PRN Motley-Mangrum, Jadeka A, PMHNP       nicotine (NICODERM CQ - dosed in mg/24 hours) patch 14 mg  14 mg Transdermal Daily Motley-Mangrum, Jadeka A, PMHNP       traZODone (DESYREL) tablet 50 mg  50 mg Oral QHS PRN Motley-Mangrum, Jadeka A, PMHNP       Lab Results:  No results found for this or any previous visit (from the past 87 hour(s)).  Blood Alcohol level:  Lab Results  Component Value Date   ETH <10 03/50/0938   Metabolic Disorder Labs: Lab Results  Component Value Date   HGBA1C 6.9 (H) 12/23/2022   MPG 151.33 12/23/2022   MPG 142.72 12/22/2022   No results found for: "PROLACTIN" Lab Results  Component Value Date   CHOL 116 12/22/2022   TRIG 104 12/22/2022   HDL 34 (L) 12/22/2022   CHOLHDL 3.4 12/22/2022   VLDL 21 12/22/2022   LDLCALC 61 12/22/2022   LDLCALC 85 06/21/2021   Physical Findings: AIMS:  , ,  ,  ,    CIWA:    COWS:     Musculoskeletal: Strength & Muscle Tone: within normal limits Gait & Station: normal Patient leans: N/A  Psychiatric Specialty Exam:  Presentation  General Appearance:  Appropriate for Environment; Casual; Fairly Groomed  Eye Contact: Good  Speech: Clear and Coherent; Normal Rate  Speech Volume: Normal  Handedness: Right  Mood and Affect  Mood: Euthymic  Affect: Appropriate; Congruent  Thought Process  Thought Processes: Coherent  Descriptions of Associations:Intact  Orientation:Full (Time,  Place and Person)  Thought Content:Logical  History of Schizophrenia/Schizoaffective disorder:No  Duration of Psychotic Symptoms:No data recorded Hallucinations:Hallucinations: None  Ideas of Reference:None  Suicidal Thoughts:Suicidal Thoughts: No SI Passive Intent and/or Plan: -- (Denies)  Homicidal Thoughts:Homicidal Thoughts: No  Sensorium  Memory: Immediate Good; Recent Good  Judgment: Good  Insight: Good  Executive Functions  Concentration: Good  Attention Span: Good  Recall: Good  Fund of Knowledge: Good  Language: Good  Psychomotor Activity  Psychomotor Activity: Psychomotor Activity: Normal  Assets  Assets: Communication Skills; Desire for Improvement; Physical Health; Housing  Sleep  Sleep: Sleep: Good Number of Hours of Sleep: 8  Physical Exam: Physical Exam Vitals and nursing note reviewed.  HENT:     Head: Normocephalic.     Nose: Nose normal.     Mouth/Throat:     Pharynx: Oropharynx is clear.  Eyes:     Pupils: Pupils are equal, round, and reactive to light.  Cardiovascular:     Rate and Rhythm: Normal rate.     Pulses: Normal pulses.     Comments: Blood pressure 158/103, pulse 74.  Nursing staff to recheck vital signs Pulmonary:     Effort: Pulmonary effort is normal.  Abdominal:     Palpations: Abdomen is soft.  Genitourinary:    Comments: Deferred Musculoskeletal:        General: Normal range of motion.     Cervical back: Normal range of motion.  Skin:    General: Skin is warm and dry.  Neurological:     General: No focal deficit present.     Mental Status: He is alert and oriented to person, place, and time.  Psychiatric:        Mood and Affect: Mood normal.        Behavior: Behavior normal.    Review of Systems  Constitutional:  Negative for chills, fever and malaise/fatigue.  HENT:  Negative for congestion and sore throat.   Eyes:  Negative for blurred vision.  Respiratory:  Negative for cough, shortness  of breath and wheezing.   Cardiovascular:  Negative for chest pain and palpitations.       Blood pressure 155/95, pulse 76.  Nursing staff to recheck vital signs  Gastrointestinal:  Negative for abdominal pain, constipation, diarrhea, heartburn, nausea and vomiting.  Genitourinary:  Negative for dysuria, flank pain, frequency, hematuria and urgency.       Hx. Bladder cancer.  Musculoskeletal:  Negative for joint pain and myalgias.  Skin:  Negative for itching and rash.  Psychiatric/Behavioral:  Positive for depression. The patient is nervous/anxious.    Blood pressure (!) 158/103, pulse 74, temperature 98.7 F (37.1 C), temperature source Oral, resp. rate 16, height '5\' 11"'$  (1.803 m), weight 99.8 kg, SpO2 97 %. Body mass index is 30.68 kg/m.  Treatment Plan Summary: Daily contact with patient to assess and evaluate symptoms and progress in treatment and Medication management.   Continue inpatient hospitalization. Will continue today 12/25/2022 plan as below except where it is noted.   Principal/active diagnoses.  Major depressive disorder, recurrent episode, severe ([place]). Generalized anxiety disorder.  Associated symptoms. Suicidal behavior  Plan:  -Continue Abilify 5 mg po daily as adjunct to Cymbalta for depression. -Continue Duloxetine 90 mg po daily for depression.  -Continue Buspar 15 mg po tid for anxiety.  -Continue Hydroxyzine 25 mg po tid prn for anxiety.  -  Continue Trazodone 50 mg po Q bedtime prn for insomnia.   Other medical issues.  -Continue ASA 325 mg po daily for heart health.  -Continue Lipitor 80 mg po Q evenings for hyperlipidemia.  -Continue Zetia 10 mg po daily for high cholesterol.  -Continue Hydrocodone 5-325 mg po Q 8 hours prn for back pain.  -Continue Losartan 100 mg po daily for HTN.    Reviewed lab results: CMP- elevated Gluc level- 133. Hgba1c: 6.9 (pre-diabetic). U/A: Presence of blood (small).   Other PRNS -Continue Tylenol 650 mg every 6  hours PRN for mild pain -Continue Maalox 30 ml Q 4 hrs PRN for indigestion -Continue MOM 30 ml po Q 6 hrs for constipation   Safety and Monitoring: Voluntary admission to inpatient psychiatric unit for safety, stabilization and treatment Daily contact with patient to assess and evaluate symptoms and progress in treatment Patient's case to be discussed in multi-disciplinary team meeting Observation Level : q15 minute checks Vital signs: q12 hours Precautions: Safety   Discharge Planning: Social work and case management to assist with discharge planning and identification of hospital follow-up needs prior to discharge Estimated LOS: 5-7 days Discharge Concerns: Need to establish a safety plan; Medication compliance and effectiveness Discharge Goals: Return home with outpatient referrals for mental health follow-up including medication management/psychotherapy  Laretta Bolster, [place] 12/25/2022, 9:58 AMPatient ID: [name], male   DOB: 04-09-1961, 62 y.o.   MRN: [id number] Patient ID: [name], male   DOB: 07-14-1961, 28 y.o.   MRN: [id number]

## 2022-12-25 NOTE — Progress Notes (Signed)
The patient rated his day as a 9 out of 10 since he anticipates being discharged tomorrow. His goal for tomorrow is to prepare for discharge.

## 2022-12-25 NOTE — Progress Notes (Signed)
   12/25/22 2000  Psych Admission Type (Psych Patients Only)  Admission Status Involuntary  Psychosocial Assessment  Patient Complaints None  Eye Contact Fair  Facial Expression Animated  Affect Appropriate to circumstance  Speech Logical/coherent  Interaction Assertive  Motor Activity Other (Comment) (WDL)  Appearance/Hygiene Unremarkable  Behavior Characteristics Cooperative;Appropriate to situation  Mood Pleasant  Thought Process  Coherency WDL  Content WDL  Delusions None reported or observed  Perception WDL  Hallucination None reported or observed  Judgment Limited  Confusion WDL  Danger to Self  Current suicidal ideation? Denies  Self-Injurious Behavior No self-injurious ideation or behavior indicators observed or expressed   Agreement Not to Harm Self Yes  Description of Agreement Verbally contracts for safety.  Danger to Others  Danger to Others None reported or observed   Patient alert and oriented. Presenting appropriate to circumstance with a pleasant mood. Patient denies SI, HI, AVH. Patient BP elevated, vitals reassessed, patient denies associated cardiac symptoms. NP made aware. Patient endorses chronic pain 5/10 from lumbar fusion surgeries, however states he does not want prn medication or heat packs at this time. Patient states he will inform RN if pain level increases. Scheduled medications administered to patient, per provider orders. Support and encouragement provided. Routine safety checks conducted every 15 minutes. Patient verbally contracts for safety. Patient remains safe on the unit.

## 2022-12-25 NOTE — Progress Notes (Signed)
Patient attended and was engaged in [facility] group this afternoon

## 2022-12-25 NOTE — Progress Notes (Signed)
Adult Psychoeducational Group Note  Date:  12/25/2022 Time:  10:14 AM  Group Topic/Focus:  Goals Group:   The focus of this group is to help patients establish daily goals to achieve during treatment and discuss how the patient can incorporate goal setting into their daily lives to aide in recovery.  Participation Level:  Active  Participation Quality:  Appropriate  Affect:  Flat  Cognitive:  Appropriate  Insight: Appropriate  Engagement in Group:  Engaged  Modes of Intervention:  Activity  Additional Comments:  PT encouraged need to maintain stability and preparation for discharge and plans following discharge  [facility] 12/25/2022, 10:14 AM

## 2022-12-25 NOTE — Progress Notes (Signed)
   12/25/22 1000  Psych Admission Type (Psych Patients Only)  Admission Status Involuntary  Psychosocial Assessment  Patient Complaints None  Eye Contact Fair  Facial Expression Animated  Affect Appropriate to circumstance  Speech Logical/coherent  Interaction Assertive  Motor Activity Other (Comment) (steady gait)  Appearance/Hygiene Unremarkable  Behavior Characteristics Cooperative;Appropriate to situation  Mood Pleasant  Thought Process  Coherency WDL  Content WDL  Delusions None reported or observed  Perception WDL  Hallucination None reported or observed  Judgment Limited  Confusion WDL  Danger to Self  Current suicidal ideation? Denies  Self-Injurious Behavior No self-injurious ideation or behavior indicators observed or expressed   Agreement Not to Harm Self Yes  Description of Agreement verbal contract for safety  Danger to Others  Danger to Others None reported or observed

## 2022-12-25 NOTE — Group Note (Signed)
[facility] LCSW Group Therapy Note   Group Date: 12/25/2022 Start Time: 1100 End Time: 1200   Type of Therapy and Topic: Group Therapy: Avoiding Self-Sabotaging and Enabling Behaviors  Participation Level: Active  Mood:  Description of Group:  In this group, patients will learn how to identify obstacles, self-sabotaging and enabling behaviors, as well as: what are they, why do we do them and what needs these behaviors meet. Discuss unhealthy relationships and how to have positive healthy boundaries with those that sabotage and enable. Explore aspects of self-sabotage and enabling in yourself and how to limit these self-destructive behaviors in everyday life.   Therapeutic Goals: 1. Patient will identify one obstacle that relates to self-sabotage and enabling behaviors 2. Patient will identify one personal self-sabotaging or enabling behavior they did prior to admission 3. Patient will state a plan to change the above identified behavior 4. Patient will demonstrate ability to communicate their needs through discussion and/or role play.    Summary of Patient Progress:   Pt was engaged with group discussion   Therapeutic Modalities:  Cognitive Behavioral Therapy Person-Centered Therapy Motivational Interviewing    Keo Schirmer S Katieann Hungate, LCSW

## 2022-12-26 MED ORDER — NICOTINE 14 MG/24HR TD PT24: 14.0000 mg | MEDICATED_PATCH | Freq: Every day | TRANSDERMAL | 12 refills | Status: AC

## 2022-12-26 MED ORDER — TRAZODONE HCL 50 MG PO TABS: 50.0000 mg | ORAL_TABLET | Freq: Every evening | ORAL | 0 refills | Status: DC | PRN

## 2022-12-26 MED ORDER — DULOXETINE HCL 30 MG PO CPEP: 90.0000 mg | ORAL_CAPSULE | Freq: Every day | ORAL | 0 refills | Status: DC

## 2022-12-26 MED ORDER — ARIPIPRAZOLE 5 MG PO TABS: 5.0000 mg | ORAL_TABLET | Freq: Every day | ORAL | 0 refills | Status: AC

## 2022-12-26 MED ORDER — BUSPIRONE HCL 15 MG PO TABS: 15.0000 mg | ORAL_TABLET | Freq: Three times a day (TID) | ORAL | 0 refills | Status: AC

## 2022-12-26 NOTE — Progress Notes (Signed)
   12/26/22 0559  15 Minute Checks  Location Bedroom  Visual Appearance Calm  Behavior Composed  Sleep (Behavioral Health Patients Only)  Calculate sleep? (Click Yes once per 24 hr at 0600 safety check) Yes  Documented sleep last 24 hours 6.25

## 2022-12-26 NOTE — Discharge Instructions (Signed)
-  Follow-up with your outpatient psychiatric provider -instructions on appointment date, time, and address (location) are provided to you in discharge paperwork.  -Take your psychiatric medications as prescribed at discharge - instructions are provided to you in the discharge paperwork  -Follow-up with outpatient primary care doctor and other specialists -for management of preventative medicine and any chronic medical disease.  -Recommend abstinence from alcohol, tobacco, and other illicit drug use at discharge.   -If your psychiatric symptoms recur, worsen, or if you have side effects to your psychiatric medications, call your outpatient psychiatric provider, 911, [phone] or go to the nearest emergency department.  -If suicidal thoughts occur, call your outpatient psychiatric provider, 911, [phone] or go to the nearest emergency department.  Naloxone (Narcan) can help reverse an overdose when given to the victim quickly.  [place] offers free naloxone kits and instructions/training on its use.  Add naloxone to your first aid kit and you can help save a life.   Pick up your free kit at the following locations:   [place]:  [facility], [address] [place] [address] ([phone]) [facility] [address] [place] [id number] ([phone]) [facility] [address] [place] [address]  [place]: [facility] [address] [place] [address] ([phone]) [facility] [address] [place]  [address] ([phone])  

## 2022-12-26 NOTE — Progress Notes (Signed)
[facility] LCSW Progress Note   12/26/2022    9:43 AM  [name]      Type of Note: Medicare Notice  Patient informed of right to appeal discharge, provided phone number to [facility]. Patient expressed no interest in appealing discharge at this time. CSW will continue to monitor situation.     Signed:   Silas Flood, MSW, [facility] 12/26/2022 9:43 AM

## 2022-12-26 NOTE — Discharge Summary (Signed)
Physician Discharge Summary Note  Patient:  [name] is an 62 y.o., male MRN:  [id number] DOB:  June 25, 1961 Patient phone:  [phone] (home)  Patient address:   [address] [place] [address],  Total Time spent with patient: 30 minutes  Date of Admission:  12/21/2022 Date of Discharge:   12/26/2022  Reason for Admission:  [name] is a 67 year old divorced Caucasian male with hx of major depressive disorder, recurrent episodes, anxiety disorder & PTSD. Patient has other chronic medical conditions. He is admitted to the [facility] from the [facility] with complaint of suicidal ideations with plans to use a gun on himself.    Principal Problem: Major depressive disorder, recurrent episode, severe ([place]) Discharge Diagnoses: Principal Problem:   Major depressive disorder, recurrent episode, severe ([place]) Active Problems:   Generalized anxiety disorder   Suicidal behavior   Past Psychiatric History:  Major depressive disorder, anxiety disorder, PTSD. Denies any psychiatric hospitalization, however, says has been receiving mental health care for over 20 years. Has been in counseling sessions for two years due to PTSD   Past Medical History:  Past Medical History:  Diagnosis Date   Acute pancreatitis    Anxiety    Atrial flutter ([place]) 09/30/2013   Benign essential hypertension    per pt denies ,  but is documented in epic by his pcp   Bladder cancer [facility]) urologist-- dr bell   dx 08/ 2019  s/p  TURBT   Bladder tumor    CAD (coronary artery disease)    last cardiac cath 08-12-2014  mild non-obstructive CAD ,  normal LVF   DDD (degenerative disc disease), lumbar    Depression    Diabetes mellitus type 2, diet-controlled ([place])    Fatty liver    First degree heart block    Headache(784.0)    History of acute pancreatitis 02/2015   History of kidney stones    History of syncope    Hyperlipidemia    Intermediate coronary syndrome (HCC)    Intermittent  palpitations    takes bystolic   Obesity    Pancreatic divisum 2016   PTSD (post-traumatic stress disorder)     Past Surgical History:  Procedure Laterality Date   CARDIAC CATHETERIZATION  10-08-2002;  03-15-2006   mild non-obstructive CAD,  normal LVF   ENTEROSCOPY N/A 03/20/2015   Procedure: ENTEROSCOPY;  Surgeon: Jerene Bears, MD;  Location: [facility] ENDOSCOPY;  Service: Endoscopy;  Laterality: N/A;   ERCP  04/2015   stone in pancreatic duct   LEFT HEART CATHETERIZATION WITH CORONARY ANGIOGRAM N/A 08/12/2014   Procedure: LEFT HEART CATHETERIZATION WITH CORONARY ANGIOGRAM;  Surgeon: Burnell Blanks, MD;  Location: [facility] CATH LAB;  Service: Cardiovascular;  Laterality: N/A;   POSTERIOR LUMBAR FUSION  2010   L3 -- 5   TONSILLECTOMY  1979  age 28   TRANSURETHRAL RESECTION OF BLADDER TUMOR WITH MITOMYCIN-C N/A 07/23/2018   Procedure: TRANSURETHRAL RESECTION OF BLADDER TUMOR WITH GEMCITABINE;  Surgeon: Lucas Mallow, MD;  Location: [facility] ORS;  Service: Urology;  Laterality: N/A;   TRANSURETHRAL RESECTION OF BLADDER TUMOR WITH MITOMYCIN-C N/A 12/19/2018   Procedure: TRANSURETHRAL RESECTION OF BLADDER TUMOR WITH GEMCITABINE;  Surgeon: Lucas Mallow, MD;  Location: [facility];  Service: Urology;  Laterality: N/A;   Family History:  Family History  Problem Relation Age of Onset   Heart attack Father        father died of heart  attach in his 17s   Hyperlipidemia Father    Early death Father    Lung cancer Mother    Hyperlipidemia Mother    Hyperlipidemia Brother    Alzheimer's disease Maternal Grandmother    Colon cancer Neg Hx    Colon polyps Neg Hx    Esophageal cancer Neg Hx    Rectal cancer Neg Hx    Stomach cancer Neg Hx    Family Psychiatric  History: Patient denies any familial hx of mental illness.   Social History:  Social History   Substance and Sexual Activity  Alcohol Use Not Currently   Comment: socially     Social History   Substance and  Sexual Activity  Drug Use No    Social History   Socioeconomic History   Marital status: Divorced    Spouse name: Not on file   Number of children: 2   Years of education: Not on file   Highest education level: Not on file  Occupational History   Not on file  Tobacco Use   Smoking status: Never   Smokeless tobacco: Not on file   Tobacco comments:    Patient indicated interest for nicotine patch.  Vaping Use   Vaping Use: Some days  Substance and Sexual Activity   Alcohol use: Not Currently    Comment: socially   Drug use: No   Sexual activity: Yes  Other Topics Concern   Not on file  Social History Narrative   ** Merged History Encounter **       Social Determinants of Health   Financial Resource Strain: Medium Risk (01/31/2022)   Overall Financial Resource Strain (CARDIA)    Difficulty of Paying Living Expenses: Somewhat hard  Food Insecurity: Food Insecurity Present (12/21/2022)   Hunger Vital Sign    Worried About Running Out of Food in the Last Year: Sometimes true    Ran Out of Food in the Last Year: Never true  Transportation Needs: No Transportation Needs (12/21/2022)   PRAPARE - Hydrologist (Medical): No    Lack of Transportation (Non-Medical): No  Physical Activity: Inactive (01/31/2022)   Exercise Vital Sign    Days of Exercise per Week: 0 days    Minutes of Exercise per Session: 0 min  Stress: Stress Concern Present (01/31/2022)   [place]    Feeling of Stress : To some extent  Social Connections: Moderately Isolated (12/21/2022)   Social Connection and Isolation Panel [NHANES]    Frequency of Communication with Friends and Family: More than three times a week    Frequency of Social Gatherings with Friends and Family: Twice a week    Attends Religious Services: 1 to 4 times per year    Active Member of [employer] or Organizations: Patient refused    Attends Theatre manager Meetings: Never    Marital Status: Divorced    Hospital Course: As per admission notes: This is the psychiatric admission evaluation in this [facility] or this 62 year old divorced Caucasian male with hx of major depressive disorder, recurrent episodes, anxiety disorder & PTSD. Patient has other chronic medical conditions. He is admitted to the [facility] from the [facility] with complaint of suicidal ideations with plans to use a gun on himself.   Prior to this discharge, Arran was seen & evaluated for mental health stability. The current laboratory findings were reviewed (stable), nurses notes & vital signs were  reviewed as well. There are no current mental health or medical issues that should prevent this discharge at this time. Patient is being discharged to continue mental health care as noted below.    After the above admission evaluation, Malaquias's presenting symptoms were noted. He was recommended for mood stabilization treatments. The medication regimen targeting those presenting symptoms were discussed with him & initiated with his consent. He was medicated, stabilized & discharged on the medications as listed on his discharge medication lists below. Besides the mood stabilization treatments, he was was also enrolled & participated in the group counseling sessions being offered & held on this unit. He learned coping skills. He presented no other significant pre-existing medical issues that required treatment.  He tolerated his treatment regimen without any adverse effects or reactions reported. Yoandri's symptoms responded well to his treatment regimen warranting this discharge. Patient is also mentally/medically stable & agreeable to this discharge.   During the course of this this hospitalization, there were no instances of behavioral issues that required restraints or immediate intervention. Patient remained safe on the unit. There were no instances of self-harming behaviors noted or  reported by staff. There were no threats to other patients/staff. He remained cooperative to his daily routines & in taking his treatment regimen as recommended by his treatment team. He participated in the group sessions and interacted with staff and other patients appropriately. He learned coping skills.   Over the course of this hospitalization, patient's symptoms responded well to his treatment regimen & his has improved. He was able to maintain his personal hygiene on daily basis throughout this hospitalization. On this day of his hospital discharge, patient denies any thoughts of self-harm, suicidal/homicidal ideations. He is not observed to be responding to internal any internal stimuli as he denied any AVH, delusional thoughts or paranoia. He has been compliant with his recommended treatment regimen. He is currently showing readiness for discharge & is future-oriented thinking. Patient is encouraged to keep all psychiatric appointments and continue with his medications as prescribed. He was able to engage in safety including plan to return to [facility], call 911, [phone] ot go to the nearest ED in the event of worsening symptoms. Lowen left [facility] with all personal belongings in no apparent distress.   Physical Findings: AIMS:  , ,  ,  ,    CIWA:    COWS:     Musculoskeletal: Strength & Muscle Tone: within normal limits Gait & Station: normal Patient leans: N/A  Psychiatric Specialty Exam:  Presentation  General Appearance:  Casual; Fairly Groomed  Eye Contact: Good  Speech: Clear and Coherent; Normal Rate  Speech Volume: Normal  Handedness: Right  Mood and Affect  Mood: Euthymic  Affect: Appropriate; Congruent; Full Range  Thought Process  Thought Processes: Linear  Descriptions of Associations:Intact  Orientation:Full (Time, Place and Person)  Thought Content:Logical  History of Schizophrenia/Schizoaffective disorder:No  Duration of Psychotic Symptoms:No data  recorded Hallucinations:Hallucinations: None  Ideas of Reference:None  Suicidal Thoughts:Suicidal Thoughts: No SI Passive Intent and/or Plan: -- (Denies)  Homicidal Thoughts:Homicidal Thoughts: No  Sensorium  Memory: Immediate Good; Recent Good  Judgment: Good  Insight: Good   Executive Functions  Concentration: Good  Attention Span: Good  Recall: Good  Fund of Knowledge: Good  Language: Good  Psychomotor Activity  Psychomotor Activity: Psychomotor Activity: Normal  Assets  Assets: Communication Skills; Desire for Improvement; Physical Health; Housing  Sleep  Sleep: Sleep: [place] Number of Hours of Sleep: 8  Physical Exam: Physical Exam  Vitals and nursing note reviewed.  HENT:     Head: Normocephalic.     Nose: Nose normal.     Mouth/Throat:     Mouth: Mucous membranes are moist.     Pharynx: Oropharynx is clear.  Eyes:     Conjunctiva/sclera: Conjunctivae normal.     Pupils: Pupils are equal, round, and reactive to light.  Cardiovascular:     Rate and Rhythm: Normal rate.     Pulses: Normal pulses.  Pulmonary:     Effort: Pulmonary effort is normal.  Abdominal:     Palpations: Abdomen is soft.  Genitourinary:    Comments: Deferred Musculoskeletal:        General: Normal range of motion.     Cervical back: Normal range of motion.  Skin:    General: Skin is warm.  Neurological:     General: No focal deficit present.     Mental Status: He is oriented to person, place, and time.  Psychiatric:        Mood and Affect: Mood normal.        Behavior: Behavior normal.        Thought Content: Thought content normal.    Review of Systems  Constitutional: Negative.   HENT: Negative.    Eyes: Negative.   Respiratory: Negative.    Cardiovascular: Negative.   Gastrointestinal: Negative.   Genitourinary: Negative.   Musculoskeletal: Negative.   Skin: Negative.   Neurological: Negative.   Endo/Heme/Allergies: Negative.    Psychiatric/Behavioral:  Positive for depression (Proved with medication) and suicidal ideas (Patient denies). The patient is nervous/anxious (Able with medication) and has insomnia (Improved with medication).    Blood pressure (!) 141/97, pulse 77, temperature 98.7 F (37.1 C), temperature source Oral, resp. rate 16, height '5\' 11"'$  (1.803 m), weight 99.8 kg, SpO2 96 %. Body mass index is 30.68 kg/m.   Social History   Tobacco Use  Smoking Status Never  Smokeless Tobacco Not on file  Tobacco Comments   Patient indicated interest for nicotine patch.   Tobacco Cessation:  N/A, patient does not currently use tobacco products   Blood Alcohol level:  Lab Results  Component Value Date   ETH <10 51/76/1607    Metabolic Disorder Labs:  Lab Results  Component Value Date   HGBA1C 6.9 (H) 12/23/2022   MPG 151.33 12/23/2022   MPG 142.72 12/22/2022   No results found for: "PROLACTIN" Lab Results  Component Value Date   CHOL 116 12/22/2022   TRIG 104 12/22/2022   HDL 34 (L) 12/22/2022   CHOLHDL 3.4 12/22/2022   VLDL 21 12/22/2022   LDLCALC 61 12/22/2022   [place] 85 06/21/2021    See Psychiatric Specialty Exam and Suicide Risk Assessment completed by Attending Physician prior to discharge.  Discharge destination:  Home  Is patient on multiple antipsychotic therapies at discharge:  No   Has Patient had three or more failed trials of antipsychotic monotherapy by history:  No  Recommended Plan for Multiple Antipsychotic Therapies: NA  Discharge Instructions     Diet - low sodium heart healthy   Complete by: As directed    Increase activity slowly   Complete by: As directed    Increase activity slowly   Complete by: As directed       Allergies as of 12/26/2022   No Known Allergies      Medication List     TAKE these medications      Indication  ARIPiprazole 5 MG tablet Commonly  known as: ABILIFY Take 1 tablet (5 mg total) by mouth at bedtime.  Indication:  Major Depressive Disorder   aspirin 325 MG tablet Take 325 mg by mouth daily.  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 What changed: when to take this  Indication: High Amount of Fats in the Blood   busPIRone 15 MG tablet Commonly known as: BUSPAR Take 1 tablet (15 mg total) by mouth 3 (three) times daily. What changed:  medication strength how much to take additional instructions  Indication: Anxiety Disorder, Major Depressive Disorder   cetirizine 10 MG tablet Commonly known as: ZYRTEC Take 1 tablet by mouth daily.  Indication: Hayfever   Cholecalciferol 50 MCG (2000 UT) Tabs Take by mouth.  Indication: Vitamin D Deficiency   DULoxetine 30 MG capsule Commonly known as: CYMBALTA Take 3 capsules (90 mg total) by mouth daily. Start taking on: December 27, 2022 What changed:  how much to take additional instructions Another medication with the same name was removed. Continue taking this medication, and follow the directions you see here.  Indication: Major Depressive Disorder   ezetimibe 10 MG tablet Commonly known as: ZETIA Take 1 tablet (10 mg total) by mouth daily.  Indication: High Amount of Fats in the Blood   fluticasone 50 MCG/ACT nasal spray Commonly known as: FLONASE Place 2 sprays into both nostrils at bedtime as needed for allergies or rhinitis.  Indication: Allergic Rhinitis   HYDROcodone-acetaminophen 5-325 MG tablet Commonly known as: NORCO/VICODIN Take 1 tablet by mouth every 8 (eight) hours as needed for severe pain.  Indication: Pain   losartan 100 MG tablet Commonly known as: COZAAR Take 1 tablet (100 mg total) by mouth daily. For high blood pressure.  Indication: High Blood Pressure Disorder   multivitamin tablet Take 1 tablet by mouth daily.  Indication: Nutritional Support   nicotine 14 mg/24hr  patch Commonly known as: NICODERM CQ - dosed in mg/24 hours Place 1 patch (14 mg total) onto the skin daily. Start taking on: December 27, 2022  Indication: Nicotine Addiction   testosterone cypionate 200 MG/ML injection Commonly known as: DEPOTESTOSTERONE CYPIONATE Inject 5 mLs into the muscle every 7 (seven) days.  Indication: Deficient Activity of the Testis   traZODone 50 MG tablet Commonly known as: DESYREL Take 1 tablet (50 mg total) by mouth at bedtime as needed for sleep.  Indication: Trouble Sleeping, Major Depressive Disorder        Follow-up [facility] Follow up.   Specialty: Behavioral Health Why: You have an appointment for therapy services on 01/24/23 at 1:00 pm.  You have an appointment for medication management services. Contact information: [address] [place], [place]. Go on 01/18/2023.   Why: You have an appointment for medication management services on 01/18/23 at 2:00 pm.  * This appointment will be held in person, but you may call to switch to Virtual. Contact information: [place] [address] [phone]         Center, [place]. Go on 01/09/2023.   Why: You have an appointment for therapy services on 01/09/23 at 6:00 pm.   This appointment will be held in person (you may switch to Virtual).  This provider also has family therapy services. Contact information: [place], [place], [place] [address] [phone]                 Follow-up recommendations:  Activity:  As tolerated Diet:  Regular no added salt  Comments:   Patient is recommended to follow-up care on an outpatient basis as noted above.  Prescriptions sent to pt's pharmacy of choice at discharge.    Patient agreeable to plan.    Given opportunity to ask questions.    Appears to feel comfortable with discharge denies any current suicidal or homicidal thought.  Patient is also instructed prior to discharge to: Take all medications as prescribed by his/her mental healthcare provider.  Report any adverse effects and or reactions from the medicines to his/her outpatient provider promptly.  Patient has been instructed & cautioned: To not engage in alcohol and or illegal drug use while on prescription medicines.  In the event of worsening symptoms, patient is instructed to call the crisis hotline, 911 and or go to the nearest ED for appropriate evaluation and treatment of symptoms.  To follow-up with his/her primary care provider for your other medical issues, concerns and or health care needs.    Signed: Laretta Bolster, FNP 12/26/2022, 6:23 PM

## 2022-12-26 NOTE — Progress Notes (Signed)
  [facility] Adult Case Management Discharge Plan :  Will you be returning to the same living situation after discharge:  Yes,  Patient will be returning back with his girlfriend  At discharge, do you have transportation home?: Yes,  CSW will be arranging for a Taxi to take patient to his truck  Do you have the ability to pay for your medications: Yes,  Patient has [place]  Release of information consent forms completed and in the chart;  Patient's signature needed at discharge.  Patient to Follow up at:  Follow-up [facility] Follow up.   Specialty: Behavioral Health Why: You have an appointment for therapy services on 01/24/23 at 1:00 pm.  You have an appointment for medication management services. Contact information: [address] [place], [place]. Go on 01/18/2023.   Why: You have an appointment for medication management services on 01/18/23 at 2:00 pm.  * This appointment will be held in person, but you may call to switch to Virtual. Contact information: [place] [address] [phone]         Center, [place]. Go on 01/09/2023.   Why: You have an appointment for therapy services on 01/09/23 at 6:00 pm.   This appointment will be held in person (you may switch to Virtual).  This provider also has family therapy services. Contact information: [place], [place], [place] [address] [phone]                 Next level of care provider has access to [place] and Suicide Prevention discussed: Yes,  girlfriend Rosezella [place] [phone] / Nadene Rubins ( police officer) [phone]     Has patient been referred to the Quitline?: Patient refused referral  Patient has been referred for addiction treatment:  N/A  Charlett Lango 12/26/2022, 9:36 AM
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
## 2022-12-26 NOTE — BHH Group Notes (Signed)
Adult Psychoeducational Group Note  Date:  12/26/2022 Time:  2:14 PM  Group Topic/Focus:  Goals Group:   The focus of this group is to help patients establish daily goals to achieve during treatment and discuss how the patient can incorporate goal setting into their daily lives to aide in recovery.  Participation Level:  Active  Participation Quality:  Attentive  Affect:  Appropriate  Cognitive:  Appropriate  Insight: Appropriate  Engagement in Group:  Engaged  Modes of Intervention:  Discussion  Additional Comments:  Patient attended goal group and was attentive the duration of it. Patient's goal was to have a positive discharge.   Sahana Boyland T Ria Comment 12/26/2022, 2:14 PM

## 2022-12-26 NOTE — Progress Notes (Signed)
Pt discharged to lobby. Pt was stable and appreciative at that time. All papers and prescriptions were given and valuables returned. Verbal understanding expressed. Denies SI/HI and A/VH. Pt given opportunity to express concerns and ask questions.  

## 2023-01-09 DIAGNOSIS — R948 Abnormal results of function studies of other organs and systems: Secondary | ICD-10-CM | POA: Diagnosis not present

## 2023-01-16 DIAGNOSIS — R3121 Asymptomatic microscopic hematuria: Secondary | ICD-10-CM | POA: Diagnosis not present

## 2023-01-16 DIAGNOSIS — C678 Malignant neoplasm of overlapping sites of bladder: Secondary | ICD-10-CM | POA: Diagnosis not present

## 2023-01-18 ENCOUNTER — Other Ambulatory Visit: Payer: Self-pay | Admitting: Family Medicine

## 2023-01-18 ENCOUNTER — Telehealth: Payer: Self-pay | Admitting: Family Medicine

## 2023-01-18 NOTE — Telephone Encounter (Signed)
This is the prescribing MD.  They should have had a visit today for med management per discharge note:         [facility] Go on 01/18/2023.   Why: You have an appointment for medication management services on 01/18/23 at 2:00 pm.  * This appointment will be held in person, but you may call to switch to Virtual. Contact information: [place] [address] [phone]

## 2023-01-18 NOTE — Telephone Encounter (Signed)
Contacted [name] to schedule their annual wellness visit. Appointment made for 02/04/2023.  Thank you,  Colletta Maryland,  [place] [facility] Direct Dial ??[phone]

## 2023-01-18 NOTE — Telephone Encounter (Signed)
Called patient to schedule Medicare Annual Wellness Visit (AWV). Left message for patient to call back and schedule Medicare Annual Wellness Visit (AWV).  Last date of AWV: 01/31/2022   Please schedule an appointment at any time with either Mickel Baas or [place], [facility]. .  If any questions, please contact me at [phone].  Thank you,  Colletta Maryland,  [place] [facility] Direct Dial ??[phone]

## 2023-01-24 ENCOUNTER — Ambulatory Visit (HOSPITAL_COMMUNITY): Payer: Medicare Other | Admitting: Clinical

## 2023-02-04 ENCOUNTER — Ambulatory Visit (INDEPENDENT_AMBULATORY_CARE_PROVIDER_SITE_OTHER): Payer: Medicare Other

## 2023-02-04 VITALS — Ht 71.0 in | Wt 224.0 lb

## 2023-02-04 DIAGNOSIS — Z0000 Encounter for general adult medical examination without abnormal findings: Secondary | ICD-10-CM | POA: Diagnosis not present

## 2023-02-04 NOTE — Patient Instructions (Signed)
[name] , Thank you for taking time to come for your Medicare Wellness Visit. I appreciate your ongoing commitment to your health goals. Please review the following plan we discussed and let me know if I can assist you in the future.   These are the goals we discussed:  Goals      Exercise 3x per week (30 min per time)     Try to increase exercise and try chair exercises.      Increase physical activity        This is a list of the screening recommended for you and due dates:  Health Maintenance  Topic Date Due   DTaP/Tdap/Td vaccine (2 - Td or Tdap) 06/07/2020   COVID-19 Vaccine (4 - 2023-24 season) 01/08/2023   Screening for Lung Cancer  03/29/2023   Medicare Annual Wellness Visit  02/04/2024   Colon Cancer Screening  05/22/2026   Flu Shot  Completed   Hepatitis C Screening: USPSTF Recommendation to screen - Ages 18-79 yo.  Completed   HIV Screening  Completed   Zoster (Shingles) Vaccine  Completed   HPV Vaccine  Aged Out    Advanced directives: Forms are available if you choose in the future to pursue completion.  This is recommended in order to make sure that your health wishes are honored in the event that you are unable to verbalize them to the provider.    Conditions/risks identified: Aim for 30 minutes of exercise or brisk walking, 6-8 glasses of water, and 5 servings of fruits and vegetables each day.   Next appointment: Follow up in one year for your annual wellness visit   Preventive Care 40-64 Years, Male Preventive care refers to lifestyle choices and visits with your health care provider that can promote health and wellness. What does preventive care include? A yearly physical exam. This is also called an annual well check. Dental exams once or twice a year. Routine eye exams. Ask your health care provider how often you should have your eyes checked. Personal lifestyle choices, including: Daily care of your teeth and gums. Regular physical activity. Eating  a healthy diet. Avoiding tobacco and drug use. Limiting alcohol use. Practicing safe sex. Taking low-dose aspirin every day starting at age 68. What happens during an annual well check? The services and screenings done by your health care provider during your annual well check will depend on your age, overall health, lifestyle risk factors, and family history of disease. Counseling  Your health care provider may ask you questions about your: Alcohol use. Tobacco use. Drug use. Emotional well-being. Home and relationship well-being. Sexual activity. Eating habits. Work and work Statistician. Screening  You may have the following tests or measurements: Height, weight, and BMI. Blood pressure. Lipid and cholesterol levels. These may be checked every 5 years, or more frequently if you are over 72 years old. Skin check. Lung cancer screening. You may have this screening every year starting at age 5 if you have a 30-pack-year history of smoking and currently smoke or have quit within the past 15 years. Fecal occult blood test (FOBT) of the stool. You may have this test every year starting at age 13. Flexible sigmoidoscopy or colonoscopy. You may have a sigmoidoscopy every 5 years or a colonoscopy every 10 years starting at age 21. Prostate cancer screening. Recommendations will vary depending on your family history and other risks. Hepatitis C blood test. Hepatitis B blood test. Sexually transmitted disease (STD) testing. Diabetes screening. This is  done by checking your blood sugar (glucose) after you have not eaten for a while (fasting). You may have this done every 1-3 years. Discuss your test results, treatment options, and if necessary, the need for more tests with your health care provider. Vaccines  Your health care provider may recommend certain vaccines, such as: Influenza vaccine. This is recommended every year. Tetanus, diphtheria, and acellular pertussis (Tdap, Td) vaccine.  You may need a Td booster every 10 years. Zoster vaccine. You may need this after age 70. Pneumococcal 13-valent conjugate (PCV13) vaccine. You may need this if you have certain conditions and have not been vaccinated. Pneumococcal polysaccharide (PPSV23) vaccine. You may need one or two doses if you smoke cigarettes or if you have certain conditions. Talk to your health care provider about which screenings and vaccines you need and how often you need them. This information is not intended to replace advice given to you by your health care provider. Make sure you discuss any questions you have with your health care provider. Document Released: 12/09/2015 Document Revised: 08/01/2016 Document Reviewed: 09/13/2015 Elsevier Interactive Patient Education  2017 [place] Prevention in the Home Falls can cause injuries. They can happen to people of all ages. There are many things you can do to make your home safe and to help prevent falls. What can I do on the outside of my home? Regularly fix the edges of walkways and driveways and fix any cracks. Remove anything that might make you trip as you walk through a door, such as a raised step or threshold. Trim any bushes or trees on the path to your home. Use bright outdoor lighting. Clear any walking paths of anything that might make someone trip, such as rocks or tools. Regularly check to see if handrails are loose or broken. Make sure that both sides of any steps have handrails. Any raised decks and porches should have guardrails on the edges. Have any leaves, snow, or ice cleared regularly. Use sand or salt on walking paths during winter. Clean up any spills in your garage right away. This includes oil or grease spills. What can I do in the bathroom? Use night lights. Install grab bars by the toilet and in the tub and shower. Do not use towel bars as grab bars. Use non-skid mats or decals in the tub or shower. If you need to sit down  in the shower, use a plastic, non-slip stool. Keep the floor dry. Clean up any water that spills on the floor as soon as it happens. Remove soap buildup in the tub or shower regularly. Attach bath mats securely with double-sided non-slip rug tape. Do not have throw rugs and other things on the floor that can make you trip. What can I do in the bedroom? Use night lights. Make sure that you have a light by your bed that is easy to reach. Do not use any sheets or blankets that are too big for your bed. They should not hang down onto the floor. Have a firm chair that has side arms. You can use this for support while you get dressed. Do not have throw rugs and other things on the floor that can make you trip. What can I do in the kitchen? Clean up any spills right away. Avoid walking on wet floors. Keep items that you use a lot in easy-to-reach places. If you need to reach something above you, use a strong step stool that has a grab bar. Keep  electrical cords out of the way. Do not use floor polish or wax that makes floors slippery. If you must use wax, use non-skid floor wax. Do not have throw rugs and other things on the floor that can make you trip. What can I do with my stairs? Do not leave any items on the stairs. Make sure that there are handrails on both sides of the stairs and use them. Fix handrails that are broken or loose. Make sure that handrails are as long as the stairways. Check any carpeting to make sure that it is firmly attached to the stairs. Fix any carpet that is loose or worn. Avoid having throw rugs at the top or bottom of the stairs. If you do have throw rugs, attach them to the floor with carpet tape. Make sure that you have a light switch at the top of the stairs and the bottom of the stairs. If you do not have them, ask someone to add them for you. What else can I do to help prevent falls? Wear shoes that: Do not have high heels. Have rubber bottoms. Are comfortable  and fit you well. Are closed at the toe. Do not wear sandals. If you use a stepladder: Make sure that it is fully opened. Do not climb a closed stepladder. Make sure that both sides of the stepladder are locked into place. Ask someone to hold it for you, if possible. Clearly mark and make sure that you can see: Any grab bars or handrails. First and last steps. Where the edge of each step is. Use tools that help you move around (mobility aids) if they are needed. These include: Canes. Walkers. Scooters. Crutches. Turn on the lights when you go into a dark area. Replace any light bulbs as soon as they burn out. Set up your furniture so you have a clear path. Avoid moving your furniture around. If any of your floors are uneven, fix them. If there are any pets around you, be aware of where they are. Review your medicines with your doctor. Some medicines can make you feel dizzy. This can increase your chance of falling. Ask your doctor what other things that you can do to help prevent falls. This information is not intended to replace advice given to you by your health care provider. Make sure you discuss any questions you have with your health care provider. Document Released: 09/08/2009 Document Revised: 04/19/2016 Document Reviewed: 12/17/2014 Elsevier Interactive Patient Education  2017 [employer].

## 2023-02-04 NOTE — Progress Notes (Signed)
Subjective:   [name] is a 62 y.o. male who presents for Medicare Annual/Subsequent preventive examination.  I connected with  [name] on 02/04/23 by a audio enabled telemedicine application and verified that I am speaking with the correct person using two identifiers.  Patient Location: Home  Provider Location: Home Office  I discussed the limitations of evaluation and management by telemedicine. The patient expressed understanding and agreed to proceed.  Review of Systems     Cardiac Risk Factors include: advanced age (>37mn, >>77women);hypertension;male gender;dyslipidemia     Objective:    Today's Vitals   02/04/23 1322  Weight: 224 lb (101.6 kg)  Height: '5\' 11"'$  (1.803 m)   Body mass index is 31.24 kg/m.     02/04/2023    2:04 PM 12/21/2022   10:59 PM 01/31/2022    2:16 PM 12/19/2018   10:26 AM 07/17/2018    2:29 PM 04/17/2016    4:17 PM 03/20/2015   11:28 AM  Advanced Directives  Does Patient Have a Medical Advance Directive? No  No No No No No  Would patient like information on creating a medical advance directive? No - Patient declined  No - Patient declined No - Patient declined No - Patient declined  No - patient declined information     Information is confidential and restricted. Go to Review Flowsheets to unlock data.    Current Medications (verified) Outpatient Encounter Medications as of 02/04/2023  Medication Sig   aspirin 325 MG tablet Take 325 mg by mouth daily.   atorvastatin (LIPITOR) 80 MG tablet Take 1 tablet (80 mg total) by mouth every morning. (Patient taking differently: Take 80 mg by mouth every evening.)   busPIRone (BUSPAR) 7.5 MG tablet Take 7.5 mg by mouth 3 (three) times daily.   cetirizine (ZYRTEC) 10 MG tablet Take 1 tablet by mouth daily.   Cholecalciferol 50 MCG (2000 UT) TABS Take by mouth.   ezetimibe (ZETIA) 10 MG tablet Take 1 tablet (10 mg total) by mouth daily.   fluticasone (FLONASE) 50 MCG/ACT nasal spray Place 2  sprays into both nostrils at bedtime as needed for allergies or rhinitis.   HYDROcodone-acetaminophen (NORCO/VICODIN) 5-325 MG tablet Take 1 tablet by mouth every 8 (eight) hours as needed for severe pain.   losartan (COZAAR) 100 MG tablet Take 1 tablet (100 mg total) by mouth daily. For high blood pressure.   Multiple Vitamin (MULTIVITAMIN) tablet Take 1 tablet by mouth daily.   nicotine (NICODERM CQ - DOSED IN MG/24 HOURS) 14 mg/24hr patch Place 1 patch (14 mg total) onto the skin daily.   testosterone cypionate (DEPOTESTOSTERONE CYPIONATE) 200 MG/ML injection Inject 5 mLs into the muscle every 7 (seven) days.   ARIPiprazole (ABILIFY) 5 MG tablet Take 1 tablet (5 mg total) by mouth at bedtime.   DULoxetine (CYMBALTA) 30 MG capsule Take 3 capsules (90 mg total) by mouth daily.   traZODone (DESYREL) 50 MG tablet Take 1 tablet (50 mg total) by mouth at bedtime as needed for sleep.   No facility-administered encounter medications on file as of 02/04/2023.    Allergies (verified) Patient has no known allergies.   History: Past Medical History:  Diagnosis Date   Acute pancreatitis    Anxiety    [place]rial flutter ([place] 09/30/2013   Benign essential hypertension    per pt denies ,  but is documented in epic by his pcp   Bladder cancer ([facility] urolog[facility]-- dr bell   dx 08/ 2019  s/p  TURBT   Bladder tumor    CAD (coronary artery disease)    last cardiac cath 08-12-2014  mild non-obstructive CAD ,  normal LVF   DDD (degenerative disc disease), lumbar    Depression    Diabetes mellitus type 2, diet-controlled ([place])    Fatty liver    First degree heart block    Headache(784.0)    History of acute pancreatitis 02/2015   History of kidney stones    History of syncope    Hyperlipidemia    Intermediate coronary syndrome [facility])    Intermittent palpitations    takes bystolic   Obesity    Pancreatic divisum 2016   PTSD (post-traumatic stress disorder)    Past Surgical History:  Procedure  Laterality Date   CARDIAC CATHETERIZATION  10-08-2002;  03-15-2006   mild non-obstructive CAD,  normal LVF   ENTEROSCOPY N/A 03/20/2015   Procedure: ENTEROSCOPY;  Surgeon: Jerene Bears, MD;  Location: [facility] ENDOSCOPY;  Service: Endoscopy;  Laterality: N/A;   ERCP  04/2015   stone in pancreatic duct   LEFT HEART CATHETERIZATION WITH CORONARY ANGIOGRAM N/A 08/12/2014   Procedure: LEFT HEART CATHETERIZATION WITH CORONARY ANGIOGRAM;  Surgeon: Burnell Blanks, MD;  Location: [facility] CATH LAB;  Service: Cardiovascular;  Laterality: N/A;   POSTERIOR LUMBAR FUSION  2010   L3 -- 5   TONSILLECTOMY  1979  age 40   TRANSURETHRAL RESECTION OF BLADDER TUMOR WITH MITOMYCIN-C N/A 07/23/2018   Procedure: TRANSURETHRAL RESECTION OF BLADDER TUMOR WITH GEMCITABINE;  Surgeon: Lucas Mallow, MD;  Location: [facility] ORS;  Service: Urology;  Laterality: N/A;   TRANSURETHRAL RESECTION OF BLADDER TUMOR WITH MITOMYCIN-C N/A 12/19/2018   Procedure: TRANSURETHRAL RESECTION OF BLADDER TUMOR WITH GEMCITABINE;  Surgeon: Lucas Mallow, MD;  Location: [facility];  Service: Urology;  Laterality: N/A;   Family History  Problem Relation Age of Onset   Heart attack Father        father died of heart attach in his 13s   Hyperlipidemia Father    Early death Father    Lung cancer Mother    Hyperlipidemia Mother    Hyperlipidemia Brother    Alzheimer's disease Maternal Grandmother    Colon cancer Neg Hx    Colon polyps Neg Hx    Esophageal cancer Neg Hx    Rectal cancer Neg Hx    Stomach cancer Neg Hx    Social History   Socioeconomic History   Marital status: Divorced    Spouse name: Not on file   Number of children: 2   Years of education: Not on file   Highest education level: Not on file  Occupational History   Not on file  Tobacco Use   Smoking status: Never   Smokeless tobacco: Not on file   Tobacco comments:    Patient indicated interest for nicotine patch.  Vaping Use   Vaping Use: Some  days  Substance and Sexual Activity   Alcohol use: Not Currently    Comment: socially   Drug use: No   Sexual activity: Yes  Other Topics Concern   Not on file  Social History Narrative   ** Merged History Encounter **       Social Determinants of Health   Financial Resource Strain: Low Risk  (02/04/2023)   Overall Financial Resource Strain (CARDIA)    Difficulty of Paying Living Expenses: Not very hard  Food Insecurity: Food Insecurity Present (02/04/2023)   Hunger Vital  Sign    Worried About Charity fundraiser in the Last Year: Sometimes true    Ran Out of Food in the Last Year: Sometimes true  Transportation Needs: No Transportation Needs (02/04/2023)   PRAPARE - Hydrologist (Medical): No    Lack of Transportation (Non-Medical): No  Physical Activity: Inactive (02/04/2023)   Exercise Vital Sign    Days of Exercise per Week: 0 days    Minutes of Exercise per Session: 0 min  Stress: Stress Concern Present (02/04/2023)   [place]    Feeling of Stress : To some extent  Social Connections: Moderately Integrated (02/04/2023)   Social Connection and Isolation Panel [NHANES]    Frequency of Communication with Friends and Family: More than three times a week    Frequency of Social Gatherings with Friends and Family: Twice a week    Attends Religious Services: 1 to 4 times per year    Active Member of [employer] or Organizations: No    Attends Music therapist: More than 4 times per year    Marital Status: Divorced  Recent Concern: Social Connections - Moderately Isolated (12/21/2022)   Social Connection and Isolation Panel [NHANES]    Frequency of Communication with Friends and Family: More than three times a week    Frequency of Social Gatherings with Friends and Family: Twice a week    Attends Religious Services: 1 to 4 times per year    Active Member of [employer] or Organizations:  Patient refused    Attends Archivist Meetings: Never    Marital Status: Divorced    Tobacco Counseling Counseling given: Not Answered Tobacco comments: Patient indicated interest for nicotine patch.   Clinical Intake:  Pre-visit preparation completed: Yes  Pain : No/denies pain  Diabetes: No  How often do you need to have someone help you when you read instructions, pamphlets, or other written materials from your doctor or pharmacy?: 1 - Never  Diabetic?No   Interpreter Needed?: No  Information entered by :: Denman George LPN   Activities of Daily Living    02/04/2023    2:04 PM 12/21/2022   10:59 PM  In your present state of health, do you have any difficulty performing the following activities:  Hearing? 0   Vision? 0   Difficulty concentrating or making decisions? 0   Walking or climbing stairs? 0   Dressing or bathing? 0   Doing errands, shopping? 0   Preparing Food and eating ? N   Using the Toilet? N   In the past six months, have you accidently leaked urine? N   Do you have problems with loss of bowel control? N   Managing your Medications? N   Managing your Finances? N   Housekeeping or managing your Housekeeping? N      Information is confidential and restricted. Go to Review Flowsheets to unlock data.    Patient Care Team: Janora Norlander, DO as PCP - General (Family Medicine)  Indicate any recent Medical Services you may have received from other than [facility] providers in the past year (date may be approximate).     Assessment:   This is a routine wellness examination for [facility].  Hearing/Vision screen Hearing Screening - Comments:: Denies hearing difficulties  Vision Screening - Comments:: Wears rx glasses - up to date with routine eye exams   Dietary issues and exercise activities discussed: Current Exercise Habits:  The patient does not participate in regular exercise at present   Goals Addressed             This Visit's  Progress    Increase physical activity         Depression Screen    02/04/2023    2:03 PM 07/04/2022    2:30 PM 05/28/2022   12:07 PM 02/22/2022    1:27 PM 01/31/2022    2:09 PM 06/14/2021    3:00 PM 06/20/2020    1:04 PM  PHQ 2/9 Scores  PHQ - 2 Score '2 2 3 2 1 '$ 0 3  PHQ- 9 Score  '10 11 4   3    '$ Fall Risk    02/04/2023    2:01 PM 07/04/2022    2:29 PM 02/22/2022    1:27 PM 01/31/2022    2:16 PM 01/30/2022    8:24 PM  [place] in the past year? 0 0 0 0 0  Number falls in past yr: 0   0   Injury with Fall? 0   0   Risk for fall due to : No Fall Risks   No Fall Risks   Follow up Falls prevention discussed;Education provided;Falls evaluation completed   Falls prevention discussed     FALL RISK PREVENTION PERTAINING TO THE HOME:  Any stairs in or around the home? No  If so, are there any without handrails? No  Home free of loose throw rugs in walkways, pet beds, electrical cords, etc? Yes  Adequate lighting in your home to reduce risk of falls? Yes   ASSISTIVE DEVICES UTILIZED TO PREVENT FALLS:  Life alert? No  Use of a cane, walker or w/c? No  Grab bars in the bathroom? Yes  Shower chair or bench in shower? No  Elevated toilet seat or a handicapped toilet? No   TIMED UP AND GO:  Was the test performed? No . Telephonic visit   Cognitive Function:        02/04/2023    2:04 PM 01/31/2022    2:20 PM  6CIT Screen  What Year? 0 points 0 points  What month? 0 points 0 points  What time? 0 points 0 points  Count back from 20 0 points 0 points  Months in reverse 0 points 0 points  Repeat phrase 0 points 0 points  Total Score 0 points 0 points    Immunizations Immunization History  Administered Date(s) Administered   Influenza Inj Mdck Quad Pf 10/15/2018   Influenza,inj,Quad PF,6+ Mos 09/07/2015, 09/05/2016, 10/02/2017, 10/14/2019, 08/14/2021   Influenza-Unspecified 09/18/2022   PFIZER(Purple Top)SARS-COV-2 Vaccination 05/05/2020, 05/26/2020   RSV,unspecified  12/13/2022   Tdap 06/07/2010   Unspecified SARS-COV-2 Vaccination 11/13/2022   Zoster Recombinat (Shingrix) 02/15/2022, 04/26/2022    TDAP status: Due, Education has been provided regarding the importance of this vaccine. Advised may receive this vaccine at local pharmacy or Health Dept. Aware to provide a copy of the vaccination record if obtained from local pharmacy or Health Dept. Verbalized acceptance and understanding.  Flu Vaccine status: Up to date  Pneumococcal vaccine status: Up to date  Covid-19 vaccine status: Information provided on how to obtain vaccines.   Qualifies for Shingles Vaccine? Yes   Zostavax completed No   Shingrix Completed?: Yes  Screening Tests Health Maintenance  Topic Date Due   DTaP/Tdap/Td (2 - Td or Tdap) 06/07/2020   COVID-19 Vaccine (4 - 2023-24 season) 01/08/2023   Lung Cancer Screening  03/29/2023  Medicare Annual Wellness (AWV)  02/04/2024   COLONOSCOPY (Pts 45-53yr Insurance coverage will need to be confirmed)  05/22/2026   INFLUENZA VACCINE  Completed   Hepatitis C Screening  Completed   HIV Screening  Completed   Zoster Vaccines- Shingrix  Completed   HPV VACCINES  Aged Out    Health Maintenance  Health Maintenance Due  Topic Date Due   DTaP/Tdap/Td (2 - Td or Tdap) 06/07/2020   COVID-19 Vaccine (4 - 2023-24 season) 01/08/2023    Colorectal cancer screening: Type of screening: Colonoscopy. Completed 05/22/16. Repeat every 10 years  Lung Cancer Screening: (Low Dose CT Chest recommended if Age 61-80years, 30 pack-year currently smoking OR have quit w/in 15years.) does qualify.   Lung Cancer Screening Referral: scheduled for 04/02/23  Additional Screening:  Hepatitis C Screening: does qualify; Completed 06/22/20  Vision Screening: Recommended annual ophthalmology exams for early detection of glaucoma and other disorders of the eye. Is the patient up to date with their annual eye exam?  Yes  Who is the provider or what is the  name of the office in which the patient attends annual eye exams? Unable to provide name; [place] If pt is not established with a provider, would they like to be referred to a provider to establish care? No .   Dental Screening: Recommended annual dental exams for proper oral hygiene  Community Resource Referral / Chronic Care Management: CRR required this visit?  No   CCM required this visit?  No      Plan:     I have personally reviewed and noted the following in the patient's chart:   Medical and social history Use of alcohol, tobacco or illicit drugs  Current medications and supplements including opioid prescriptions. Patient is currently taking opioid prescriptions. Information provided to patient regarding non-opioid alternatives. Patient advised to discuss non-opioid treatment plan with their provider. Functional ability and status Nutritional status Physical activity Advanced directives List of other physicians Hospitalizations, surgeries, and ER visits in previous 12 months Vitals Screenings to include cognitive, depression, and falls Referrals and appointments  In addition, I have reviewed and discussed with patient certain preventive protocols, quality metrics, and best practice recommendations. A written personalized care plan for preventive services as well as general preventive health recommendations were provided to patient.     [place]  [phone]  Due to this being a virtual visit, the after visit summary with patients personalized plan was offered to patient via mail or my-chart. Patient would like to access on my-chart  Nurse Notes: No concerns

## 2023-02-12 DIAGNOSIS — M75101 Unspecified rotator cuff tear or rupture of right shoulder, not specified as traumatic: Secondary | ICD-10-CM | POA: Diagnosis not present

## 2023-02-18 ENCOUNTER — Other Ambulatory Visit: Payer: Self-pay | Admitting: Family Medicine

## 2023-02-18 DIAGNOSIS — Z9889 Other specified postprocedural states: Secondary | ICD-10-CM

## 2023-03-28 ENCOUNTER — Telehealth: Payer: Self-pay | Admitting: Family Medicine

## 2023-03-28 NOTE — Telephone Encounter (Signed)
  Prescription Request  03/28/2023  Is this a "Controlled Substance" medicine? yes  Have you seen your PCP in the last 2 weeks? no  If YES, route message to pool  -  If NO, patient needs to be scheduled for appointment.  What is the name of the medication or equipment? Vicodin  Have you contacted your pharmacy to request a refill? no   Which pharmacy would you like this sent to? [facility]   Patient notified that their request is being sent to the clinical staff for review and that they should receive a response within 2 business days.    Gottschalk's pt. Pt will be out in a couple of days. I made him an appt on June 19 (1st available & put him on waitlist w/Dr G) He would like enough to get him to appt or see him sooner.

## 2023-03-29 NOTE — Telephone Encounter (Signed)
He has not been seen for this since August.  It would be illegal to send med without a face to face.  Unfortunately, he will have to wait until appt.

## 2023-03-29 NOTE — Telephone Encounter (Signed)
Attempted to call pt , went straight to vm   Left vm for cb

## 2023-04-01 ENCOUNTER — Ambulatory Visit (HOSPITAL_BASED_OUTPATIENT_CLINIC_OR_DEPARTMENT_OTHER): Payer: Medicare Other

## 2023-04-02 ENCOUNTER — Ambulatory Visit (HOSPITAL_BASED_OUTPATIENT_CLINIC_OR_DEPARTMENT_OTHER)
Admission: RE | Admit: 2023-04-02 | Discharge: 2023-04-02 | Disposition: A | Discharge status: Home / Self Care | Payer: Medicare Other | Source: Ambulatory Visit | Attending: Acute Care | Admitting: Acute Care

## 2023-04-02 DIAGNOSIS — Z87891 Personal history of nicotine dependence: Secondary | ICD-10-CM | POA: Diagnosis not present

## 2023-04-02 DIAGNOSIS — Z122 Encounter for screening for malignant neoplasm of respiratory organs: Secondary | ICD-10-CM | POA: Insufficient documentation

## 2023-04-05 ENCOUNTER — Other Ambulatory Visit: Payer: Self-pay | Admitting: Acute Care

## 2023-04-05 DIAGNOSIS — Z87891 Personal history of nicotine dependence: Secondary | ICD-10-CM

## 2023-04-05 DIAGNOSIS — Z122 Encounter for screening for malignant neoplasm of respiratory organs: Secondary | ICD-10-CM

## 2023-04-16 ENCOUNTER — Other Ambulatory Visit: Payer: Self-pay | Admitting: Family Medicine

## 2023-04-23 ENCOUNTER — Encounter: Payer: Self-pay | Admitting: Family Medicine

## 2023-04-23 ENCOUNTER — Ambulatory Visit (INDEPENDENT_AMBULATORY_CARE_PROVIDER_SITE_OTHER): Payer: Medicare Other | Admitting: Family Medicine

## 2023-04-23 VITALS — BP 157/91 | HR 92 | Temp 98.6°F | Ht 71.0 in | Wt 235.0 lb

## 2023-04-23 DIAGNOSIS — Z79899 Other long term (current) drug therapy: Secondary | ICD-10-CM | POA: Diagnosis not present

## 2023-04-23 DIAGNOSIS — Z23 Encounter for immunization: Secondary | ICD-10-CM

## 2023-04-23 DIAGNOSIS — F418 Other specified anxiety disorders: Secondary | ICD-10-CM

## 2023-04-23 DIAGNOSIS — I10 Essential (primary) hypertension: Secondary | ICD-10-CM | POA: Diagnosis not present

## 2023-04-23 DIAGNOSIS — M549 Dorsalgia, unspecified: Secondary | ICD-10-CM | POA: Diagnosis not present

## 2023-04-23 DIAGNOSIS — Z9889 Other specified postprocedural states: Secondary | ICD-10-CM | POA: Diagnosis not present

## 2023-04-23 MED ORDER — DULOXETINE HCL 30 MG PO CPEP: 30.0000 mg | ORAL_CAPSULE | Freq: Every day | ORAL | 3 refills | Status: AC

## 2023-04-23 MED ORDER — HYDROCODONE-ACETAMINOPHEN 5-325 MG PO TABS: 1.0000 | ORAL_TABLET | Freq: Three times a day (TID) | ORAL | 0 refills | Status: AC | PRN

## 2023-04-23 MED ORDER — DULOXETINE HCL 60 MG PO CPEP: 60.0000 mg | ORAL_CAPSULE | Freq: Every day | ORAL | 3 refills | Status: AC

## 2023-04-23 MED ORDER — AMLODIPINE BESYLATE 5 MG PO TABS: 5.0000 mg | ORAL_TABLET | Freq: Every day | ORAL | 3 refills | Status: AC

## 2023-04-23 NOTE — Progress Notes (Signed)
Subjective: CC: Follow-up pain PCP: Raliegh Ip, DO ZOX:[id number] [name] is Stephen 62 y.o. male presenting to clinic today for:  1.  Chronic low back pain with history of back surgery Patient with known degenerative disc disease in the lumbar spine with history of back surgery.  He utilizes Norco 5 mg as needed.  Last refill August 2023.  Denies any falls, respiratory depression, constipation.  He did have Stephen psychiatric issue earlier this year and is currently under the care of psychiatry in [place] for this.  He is also seeing Stephen Veterinary surgeon.  No SI.  He would like to get his Cymbalta renewed at the 30-60 to equal 90 mg capsules.  He did not tolerate buspirone and so his provider discontinued that and added gabapentin.  He thinks 100 mg.  Uses as needed anxiety.  He is also on Abilify 5 mg nightly and this seems to be working well for him for both mood and sleep.  He has trazodone on hand but really does not use it at all for sleep.  2.  Hypertension Patient is compliant with losartan 100 mg daily.  Blood pressures have been running high at home.  He denies any chest pain, shortness of breath, visual disturbance, edema.   ROS: Per HPI  No Known Allergies Past Medical History:  Diagnosis Date   Acute pancreatitis    Anxiety    Atrial flutter (HCC) 09/30/2013   Benign essential hypertension    per pt denies ,  but is documented in epic by his pcp   Bladder cancer [facility]) urologist-- dr bell   dx 08/ 2019  s/p  TURBT   Bladder tumor    CAD (coronary artery disease)    last cardiac cath 08-12-2014  mild non-obstructive CAD ,  normal LVF   DDD (degenerative disc disease), lumbar    Depression    Diabetes mellitus type 2, diet-controlled (HCC)    Fatty liver    First degree heart block    Headache(784.0)    History of acute pancreatitis 02/2015   History of kidney stones    History of syncope    Hyperlipidemia    Intermediate coronary syndrome (HCC)    Intermittent palpitations     takes bystolic   Obesity    Pancreatic divisum 2016   PTSD (post-traumatic stress disorder)     Current Outpatient Medications:    ARIPiprazole (ABILIFY) 5 MG tablet, Take 1 tablet (5 mg total) by mouth at bedtime., Disp: 30 tablet, Rfl: 0   aspirin 325 MG tablet, Take 325 mg by mouth daily., Disp: , Rfl:    atorvastatin (LIPITOR) 80 MG tablet, Take 1 tablet (80 mg total) by mouth every morning. (Patient taking differently: Take 80 mg by mouth every evening.), Disp: 90 tablet, Rfl: 3   busPIRone (BUSPAR) 7.5 MG tablet, Take 7.5 mg by mouth 3 (three) times daily., Disp: , Rfl:    cetirizine (ZYRTEC) 10 MG tablet, Take 1 tablet by mouth daily., Disp: , Rfl:    Cholecalciferol 50 MCG (2000 UT) TABS, Take by mouth., Disp: , Rfl:    DULoxetine (CYMBALTA) 30 MG capsule, TAKE THREE CAPSULES DAILY AS DIRECTED, Disp: 90 capsule, Rfl: 0   ezetimibe (ZETIA) 10 MG tablet, Take 1 tablet (10 mg total) by mouth daily., Disp: 90 tablet, Rfl: 3   fluticasone (FLONASE) 50 MCG/ACT nasal spray, Place 2 sprays into both nostrils at bedtime as needed for allergies or rhinitis., Disp: , Rfl:  HYDROcodone-acetaminophen (NORCO/VICODIN) 5-325 MG tablet, Take 1 tablet by mouth every 8 (eight) hours as needed for severe pain., Disp: 90 tablet, Rfl: 0   losartan (COZAAR) 100 MG tablet, Take 1 tablet (100 mg total) by mouth daily. For high blood pressure., Disp: 90 tablet, Rfl: 3   Multiple Vitamin (MULTIVITAMIN) tablet, Take 1 tablet by mouth daily., Disp: , Rfl:    nicotine (NICODERM CQ - DOSED IN MG/24 HOURS) 14 mg/24hr patch, Place 1 patch (14 mg total) onto the skin daily., Disp: 28 patch, Rfl: 12   testosterone cypionate (DEPOTESTOSTERONE CYPIONATE) 200 MG/ML injection, Inject 5 mLs into the muscle every 7 (seven) days., Disp: , Rfl:    traZODone (DESYREL) 50 MG tablet, Take 1 tablet (50 mg total) by mouth at bedtime as needed for sleep., Disp: 30 tablet, Rfl: 0 Social History   Socioeconomic History   Marital  status: Divorced    Spouse name: Not on file   Number of children: 2   Years of education: Not on file   Highest education level: Not on file  Occupational History   Not on file  Tobacco Use   Smoking status: Never   Smokeless tobacco: Not on file   Tobacco comments:    Patient indicated interest for nicotine patch.  Vaping Use   Vaping Use: Some days  Substance and Sexual Activity   Alcohol use: Not Currently    Comment: socially   Drug use: No   Sexual activity: Yes  Other Topics Concern   Not on file  Social History Narrative   ** Merged History Encounter **       Social Determinants of Health   Financial Resource Strain: Low Risk  (02/04/2023)   Overall Financial Resource Strain (CARDIA)    Difficulty of Paying Living Expenses: Not very hard  Food Insecurity: Food Insecurity Present (02/04/2023)   Hunger Vital Sign    Worried About Running Out of Food in the Last Year: Sometimes true    Ran Out of Food in the Last Year: Sometimes true  Transportation Needs: No Transportation Needs (02/04/2023)   PRAPARE - Administrator, Civil Service (Medical): No    Lack of Transportation (Non-Medical): No  Physical Activity: Inactive (02/04/2023)   Exercise Vital Sign    Days of Exercise per Week: 0 days    Minutes of Exercise per Session: 0 min  Stress: Stress Concern Present (02/04/2023)   [employer] of Occupational Health - Occupational Stress Questionnaire    Feeling of Stress : To some extent  Social Connections: Moderately Integrated (02/04/2023)   Social Connection and Isolation Panel [NHANES]    Frequency of Communication with Friends and Family: More than three times Stephen week    Frequency of Social Gatherings with Friends and Family: Twice Stephen week    Attends Religious Services: 1 to 4 times per year    Active Member of [employer] or Organizations: No    Attends Engineer, structural: More than 4 times per year    Marital Status: Divorced  Recent  Concern: Social Connections - Moderately Isolated (12/21/2022)   Social Connection and Isolation Panel [NHANES]    Frequency of Communication with Friends and Family: More than three times Stephen week    Frequency of Social Gatherings with Friends and Family: Twice Stephen week    Attends Religious Services: 1 to 4 times per year    Active Member of [employer] or Organizations: Patient declined    Attends Club or  Organization Meetings: Never    Marital Status: Divorced  Catering manager Violence: Not At Risk (02/04/2023)   Humiliation, Afraid, Rape, and Kick questionnaire    Fear of Current or Ex-Partner: No    Emotionally Abused: No    Physically Abused: No    Sexually Abused: No   Family History  Problem Relation Age of Onset   Heart attack Father        father died of heart attach in his 55s   Hyperlipidemia Father    Early death Father    Lung cancer Mother    Hyperlipidemia Mother    Hyperlipidemia Brother    Alzheimer's disease Maternal Grandmother    Colon cancer Neg Hx    Colon polyps Neg Hx    Esophageal cancer Neg Hx    Rectal cancer Neg Hx    Stomach cancer Neg Hx     Objective: Office vital signs reviewed. BP (!) 157/91   Pulse 92   Temp 98.6 F (37 C)   Ht 5\' 11"  (1.803 m)   Wt 235 lb (106.6 kg)   SpO2 99%   BMI 32.78 kg/m   Physical Examination:  General: Awake, alert, well nourished, No acute distress HEENT: Sclera white.  Moist mucous membranes Cardio: regular rate and rhythm, S1S2 heard, no murmurs appreciated Pulm: clear to auscultation bilaterally, no wheezes, rhonchi or rales; normal work of breathing on room air MSK: Ambulating independently with normal gait and station. Psych: Mood is stable, speech is normal, affect is appropriate.  He is very pleasant, interactive  Assessment/ Plan: 62 y.o. male   Back pain with history of spinal surgery - Plan: ToxASSURE Select 13 (MW), Urine, HYDROcodone-acetaminophen (NORCO/VICODIN) 5-325 MG tablet  Controlled  substance agreement signed - Plan: ToxASSURE Select 13 (MW), Urine  Depression with anxiety - Plan: DULoxetine (CYMBALTA) 30 MG capsule, DULoxetine (CYMBALTA) 60 MG capsule  Need for tetanus booster - Plan: Tdap vaccine greater than or equal to 7yo IM  Benign essential HTN - Plan: amLODipine (NORVASC) 5 MG tablet  UDS and CSA are updated per office policy.  I reviewed the controlled substance contract in detail with the patient today and he was agreeable to all and signed this.  National narcotic database reviewed and there were no red flags.  I think gabapentin is appropriate and hopefully will actually reduce his need for the opioid.  Cymbalta renewed at 30 and 60 mg to equal 90 mg total as this is his preference for dosing schedule  Tetanus shot administered today  Blood pressure not at goal so I am going to add Norvasc to his losartan in efforts to get his blood pressure at less than 140/90.  He was given Stephen blood pressure log and we discussed blood pressure goals.  He will come in to have Stephen blood pressure recheck with nurse in 2 weeks and I will see him back in 6 months for annual physical with fasting labs and Norco renewal if needed at that time  No orders of the defined types were placed in this encounter.  No orders of the defined types were placed in this encounter.    Raliegh Ip, DO [facility] [phone]

## 2023-04-23 NOTE — Patient Instructions (Addendum)
Continue losartan for blood pressure Start Amlodipine for blood pressure   Controlled Substance Guidelines:  1. You cannot get an early refill, even it is lost.  2. You cannot get controlled medications from any other doctor, unless it is the emergency department and related to a new problem or injury.  3. You cannot use alcohol, marijuana, cocaine or any other recreational drugs while using this medication. This is very dangerous.  4. You are willing to have your urine drug tested at each visit.  5. You will not drive while using this medication, because that can put yourself and others in serious danger of an accident. 6. If any medication is stolen, then there must be a police report to verify it, or it cannot be refilled.  7. I will not prescribe these medications for longer than 3 months.  8. You must bring your pill bottle to each visit.  9. You must use the same pharmacy for all refills for the medication, unless you clear it with me beforehand.  10. You cannot share or sell this medication.

## 2023-04-25 LAB — TOXASSURE SELECT 13 (MW), URINE

## 2023-05-03 ENCOUNTER — Ambulatory Visit: Payer: Medicare Other

## 2023-05-03 ENCOUNTER — Telehealth: Payer: Self-pay | Admitting: Family Medicine

## 2023-05-03 NOTE — Telephone Encounter (Signed)
That's fine.  BPs look great

## 2023-05-03 NOTE — Telephone Encounter (Signed)
Patient's blood pressure has been much better since starting the medication. He is unable to come in for appt today due to going out of town.   Wanted to report that he has been taking the levels once every morning and once every evening.   5/30 - 127/76 and 131/87 6/1 - 134/76 and 141/84 6/2 140/82 and 132/80 6/3 - 132/88 (evening)  6/4 - did not take  6/5 - 132/77 and 134/81  6/6 138/86 and 132/78 6/7 136/88 (morning)

## 2023-05-10 NOTE — Progress Notes (Signed)
[facility] Quality Team Note  Name: [name] Date of Birth: Jul 08, 1961 MRN: [id number] Date: 05/10/2023  [facility] Quality Team has reviewed this patient's chart, please see recommendations below:  [facility] Quality Other; (Called patient to offer [employer] 05/15/2023, left voicemail)

## 2023-05-15 ENCOUNTER — Ambulatory Visit: Payer: Medicare Other | Admitting: Family Medicine

## 2023-07-02 DIAGNOSIS — R5383 Other fatigue: Secondary | ICD-10-CM | POA: Diagnosis not present

## 2023-07-02 DIAGNOSIS — E782 Mixed hyperlipidemia: Secondary | ICD-10-CM | POA: Diagnosis not present

## 2023-07-02 DIAGNOSIS — I10 Essential (primary) hypertension: Secondary | ICD-10-CM | POA: Diagnosis not present

## 2023-07-02 DIAGNOSIS — Z0000 Encounter for general adult medical examination without abnormal findings: Secondary | ICD-10-CM | POA: Diagnosis not present

## 2023-07-09 DIAGNOSIS — R948 Abnormal results of function studies of other organs and systems: Secondary | ICD-10-CM | POA: Diagnosis not present

## 2023-07-17 DIAGNOSIS — C678 Malignant neoplasm of overlapping sites of bladder: Secondary | ICD-10-CM | POA: Diagnosis not present

## 2023-08-02 ENCOUNTER — Ambulatory Visit: Payer: Medicare Other

## 2024-01-07 IMAGING — CT CT CHEST LUNG CANCER SCREENING LOW DOSE W/O CM
2 of 3 series · 15 of 36 positions shown, 18 images · non-contrast
Comparison: None Available.

CLINICAL DATA: Lung cancer screening. Sixty pack-year history.
Asymptomatic. Former smoker.



[Series 2: ldct soft tissue · axial · 0.80mm/px · z∈[+106,+396]mm · 12 of 68 slices shown, 15 images]
[im 5/68  mediastinal]
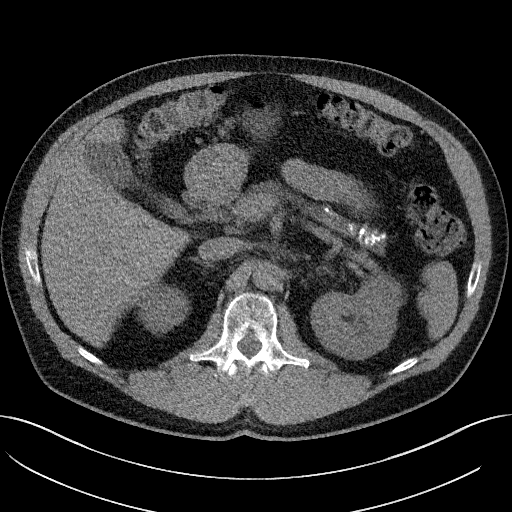
[im 5/68  lung]
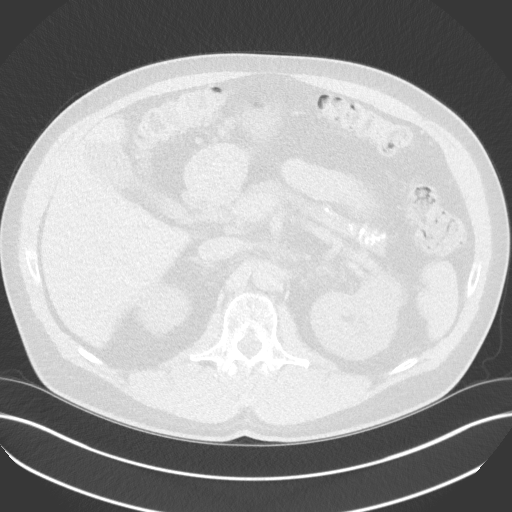
[im 10/68  lung]
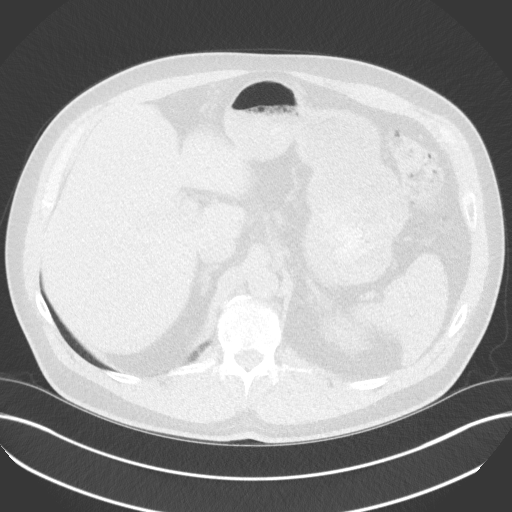
[im 15/68  lung]
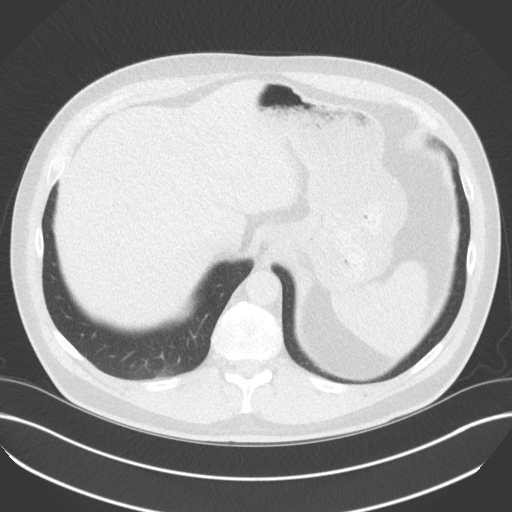
[im 20/68  lung]
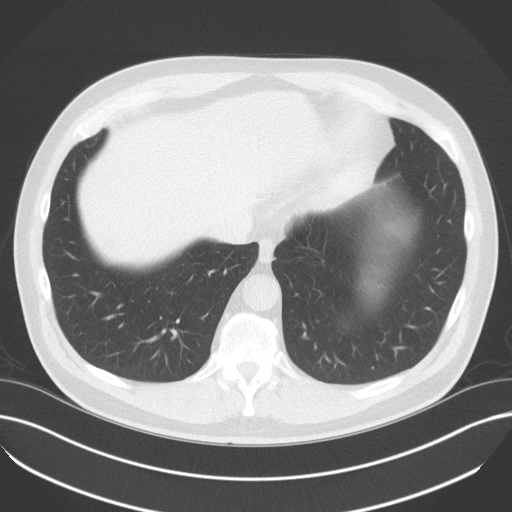
[im 25/68  mediastinal]
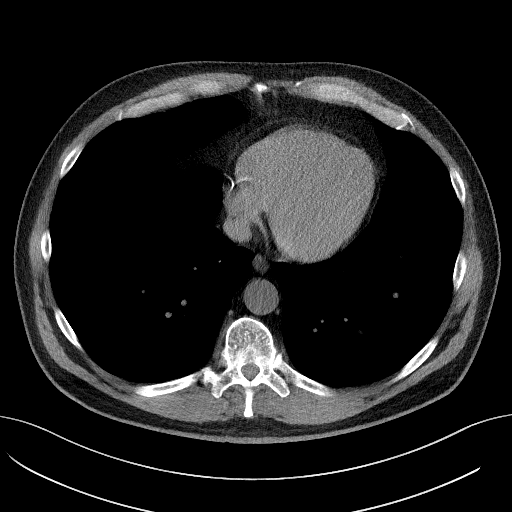
[im 25/68  lung]
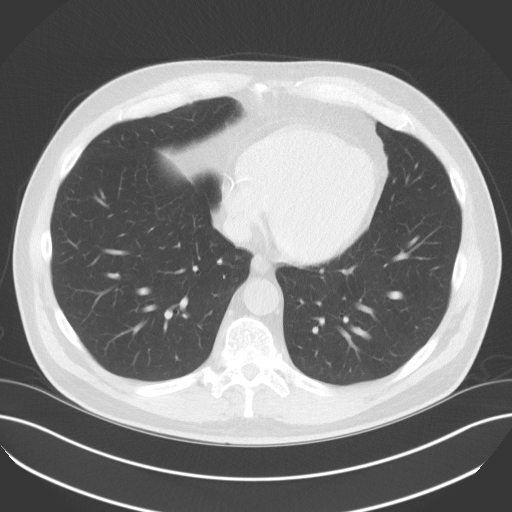
[im 30/68  lung]
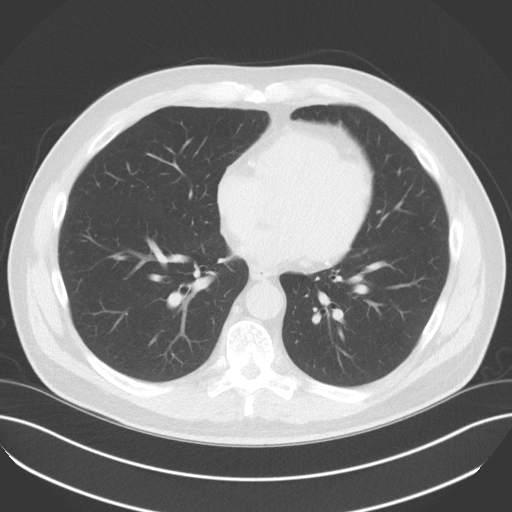
[im 38/68  lung]
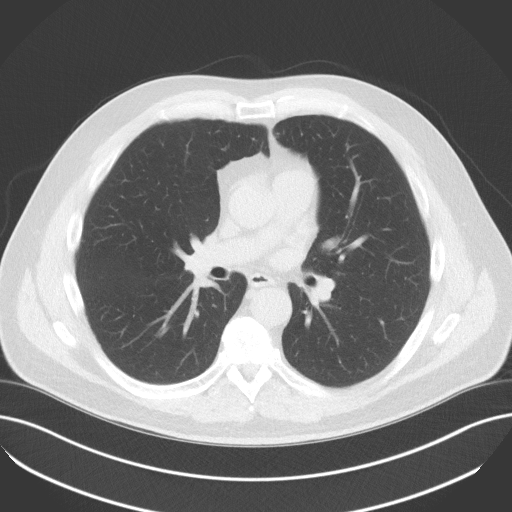
[im 43/68  lung]
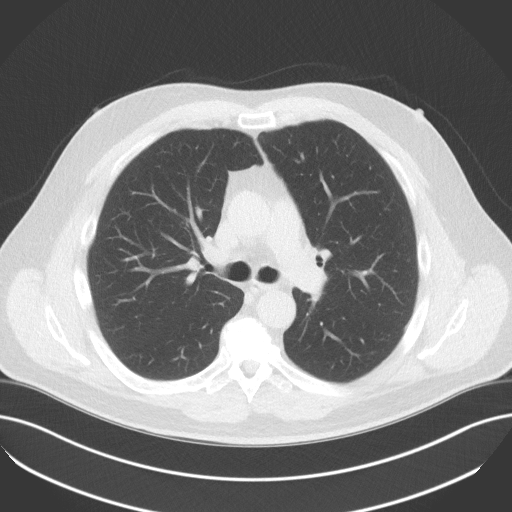
[im 48/68  mediastinal]
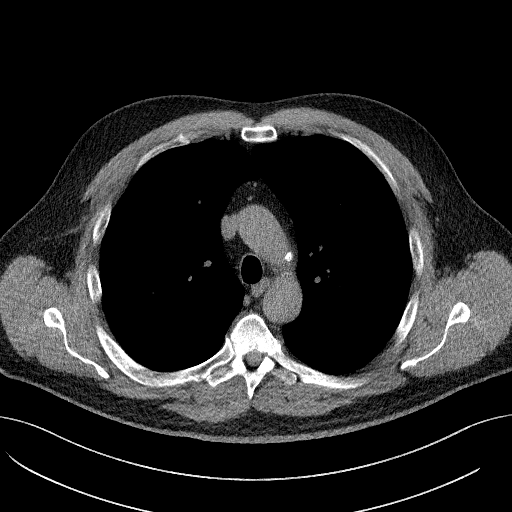
[im 48/68  lung]
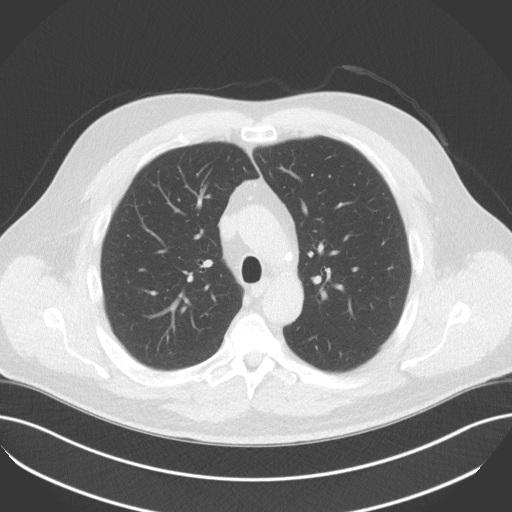
[im 53/68  lung]
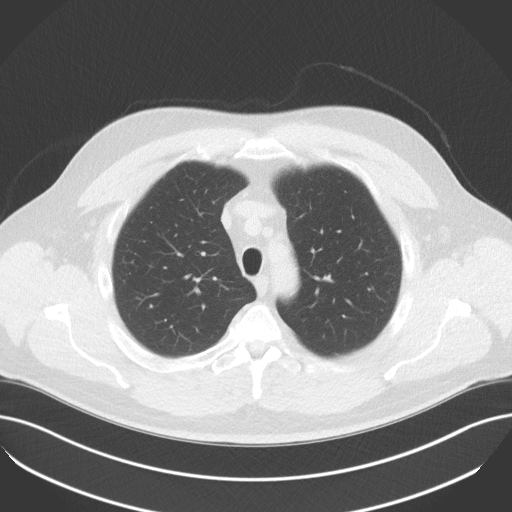
[im 58/68  lung]
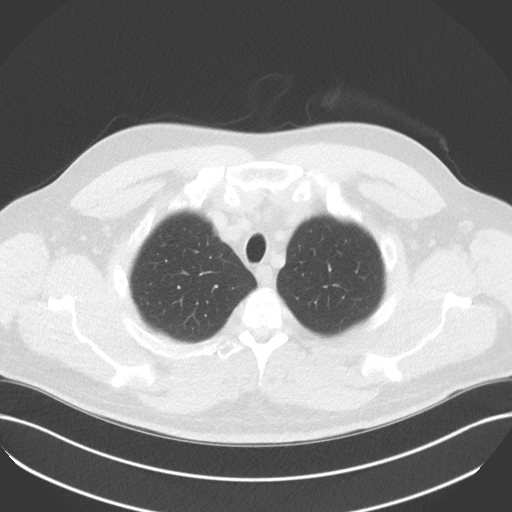
[im 63/68  lung]
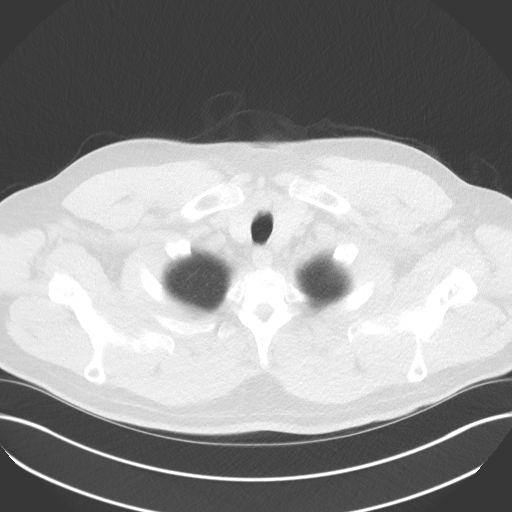

[Series 4: coronal soft · coronal · 0.70mm/px · 3 of 313 slices shown]
[im 63/313  lung]
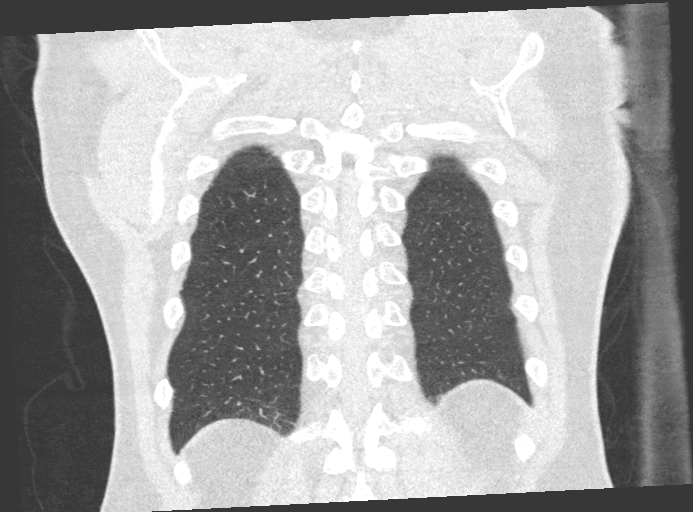
[im 125/313  lung]
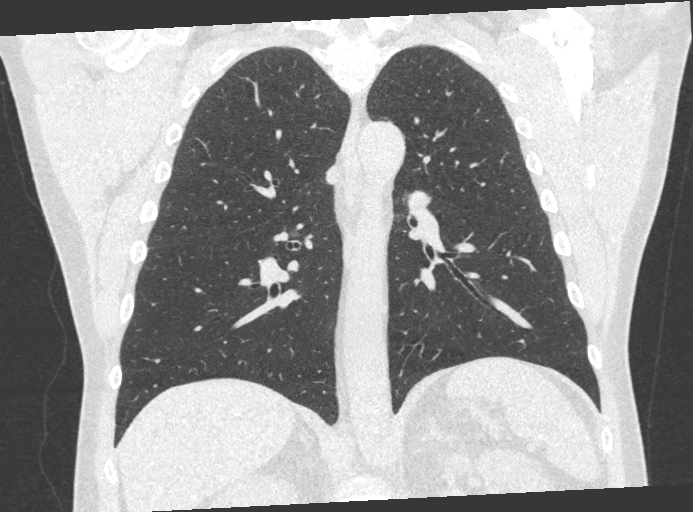
[im 188/313  lung]
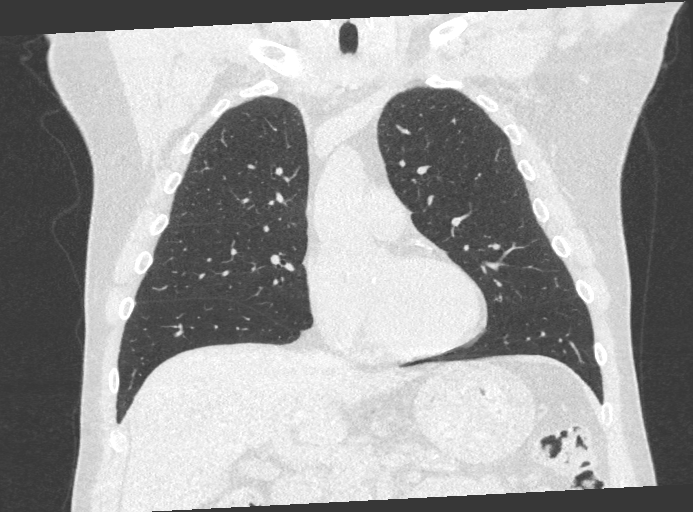

[15 of 36 positions shown; findings below may reference images not displayed]

FINDINGS: Cardiovascular: Heart size is normal. Aortic atherosclerosis.
Coronary artery calcifications. No pericardial effusion.

Mediastinum/Nodes: No enlarged mediastinal, hilar, or axillary lymph
nodes. Thyroid gland, trachea, and esophagus demonstrate no
significant findings.

Lungs/Pleura: Mild changes of centrilobular emphysema. No pleural
effusion or edema. No airspace consolidation. No suspicious lung
nodules identified bilaterally.

Upper Abdomen: Changes of chronic pancreatitis with diffuse
parenchymal and ductal calcifications identified.

Musculoskeletal: No chest wall mass or suspicious bone lesions
identified.
IMPRESSION: 1. Lung-RADS 1, negative. Continue annual screening with low-dose
chest CT without contrast in 12 months.
2. Coronary artery calcifications.
3. Changes of chronic pancreatitis.
4. Aortic Atherosclerosis ([id number]-ZE7.7) and Emphysema ([id number]).

## 2024-04-29 ENCOUNTER — Other Ambulatory Visit: Payer: Self-pay | Admitting: Acute Care

## 2024-04-29 DIAGNOSIS — Z122 Encounter for screening for malignant neoplasm of respiratory organs: Secondary | ICD-10-CM

## 2024-04-29 DIAGNOSIS — Z87891 Personal history of nicotine dependence: Secondary | ICD-10-CM

## 2024-05-20 ENCOUNTER — Ambulatory Visit (HOSPITAL_BASED_OUTPATIENT_CLINIC_OR_DEPARTMENT_OTHER): Attending: Family Medicine
